# Patient Record
Sex: Male | Born: 1935
Health system: Southern US, Community
[De-identification: ages and names within clinical notes are randomized; demographics above are authoritative.]

## PROBLEM LIST (undated history)

## (undated) DIAGNOSIS — Z8719 Personal history of other diseases of the digestive system: Secondary | ICD-10-CM

## (undated) DIAGNOSIS — I2510 Atherosclerotic heart disease of native coronary artery without angina pectoris: Secondary | ICD-10-CM

## (undated) DIAGNOSIS — K219 Gastro-esophageal reflux disease without esophagitis: Secondary | ICD-10-CM

## (undated) DIAGNOSIS — N200 Calculus of kidney: Secondary | ICD-10-CM

## (undated) DIAGNOSIS — I639 Cerebral infarction, unspecified: Secondary | ICD-10-CM

## (undated) DIAGNOSIS — I25810 Atherosclerosis of coronary artery bypass graft(s) without angina pectoris: Secondary | ICD-10-CM

## (undated) DIAGNOSIS — Z951 Presence of aortocoronary bypass graft: Secondary | ICD-10-CM

## (undated) DIAGNOSIS — E785 Hyperlipidemia, unspecified: Secondary | ICD-10-CM

## (undated) DIAGNOSIS — I951 Orthostatic hypotension: Secondary | ICD-10-CM

## (undated) DIAGNOSIS — Z86718 Personal history of other venous thrombosis and embolism: Secondary | ICD-10-CM

## (undated) DIAGNOSIS — I10 Essential (primary) hypertension: Secondary | ICD-10-CM

## (undated) DIAGNOSIS — I491 Atrial premature depolarization: Secondary | ICD-10-CM

## (undated) DIAGNOSIS — M1990 Unspecified osteoarthritis, unspecified site: Secondary | ICD-10-CM

## (undated) DIAGNOSIS — Z9861 Coronary angioplasty status: Principal | ICD-10-CM

## (undated) DIAGNOSIS — I4891 Unspecified atrial fibrillation: Secondary | ICD-10-CM

## (undated) DIAGNOSIS — R2681 Unsteadiness on feet: Secondary | ICD-10-CM

## (undated) DIAGNOSIS — Z8679 Personal history of other diseases of the circulatory system: Secondary | ICD-10-CM

## (undated) DIAGNOSIS — Z8673 Personal history of transient ischemic attack (TIA), and cerebral infarction without residual deficits: Secondary | ICD-10-CM

## (undated) DIAGNOSIS — I6523 Occlusion and stenosis of bilateral carotid arteries: Secondary | ICD-10-CM

## (undated) DIAGNOSIS — I5042 Chronic combined systolic (congestive) and diastolic (congestive) heart failure: Secondary | ICD-10-CM

## (undated) HISTORY — DX: Hyperlipidemia, unspecified: E78.5

## (undated) HISTORY — DX: Coronary angioplasty status: Z98.61

## (undated) HISTORY — DX: Atherosclerosis of coronary artery bypass graft(s) without angina pectoris: I25.810

## (undated) HISTORY — DX: Personal history of other diseases of the digestive system: Z87.19

## (undated) HISTORY — DX: Personal history of other diseases of the circulatory system: Z86.79

## (undated) HISTORY — DX: Presence of aortocoronary bypass graft: Z95.1

## (undated) HISTORY — DX: Atrial premature depolarization: I49.1

## (undated) HISTORY — PX: COLON SURGERY: SHX602

## (undated) HISTORY — DX: Essential (primary) hypertension: I10

## (undated) HISTORY — DX: Personal history of transient ischemic attack (TIA), and cerebral infarction without residual deficits: Z86.73

## (undated) HISTORY — DX: Atherosclerotic heart disease of native coronary artery without angina pectoris: I25.10

## (undated) HISTORY — DX: Calculus of kidney: N20.0

## (undated) HISTORY — DX: Personal history of other venous thrombosis and embolism: Z86.718

## (undated) HISTORY — DX: Unsteadiness on feet: R26.81

## (undated) HISTORY — DX: Occlusion and stenosis of bilateral carotid arteries: I65.23

## (undated) HISTORY — DX: Gastro-esophageal reflux disease without esophagitis: K21.9

## (undated) HISTORY — PX: CARDIAC SURGERY: SHX584

## (undated) HISTORY — DX: Chronic combined systolic (congestive) and diastolic (congestive) heart failure: I50.42

## (undated) HISTORY — DX: Unspecified atrial fibrillation: I48.91

## (undated) HISTORY — PX: STENT PLACEMENT VASCULAR (ARMC HX): HXRAD1737

## (undated) HISTORY — PX: APPENDECTOMY: SHX54

## (undated) HISTORY — DX: Orthostatic hypotension: I95.1

## (undated) HISTORY — PX: TONSILLECTOMY: SUR1361

---

## 1998-02-17 ENCOUNTER — Ambulatory Visit (HOSPITAL_COMMUNITY): Admission: RE | Admit: 1998-02-17 | Discharge: 1998-02-17 | Payer: Self-pay | Admitting: Endocrinology

## 1998-06-15 ENCOUNTER — Ambulatory Visit (HOSPITAL_COMMUNITY): Admission: RE | Admit: 1998-06-15 | Discharge: 1998-06-15 | Payer: Self-pay | Admitting: *Deleted

## 1999-06-17 ENCOUNTER — Encounter: Admission: RE | Admit: 1999-06-17 | Discharge: 1999-06-17 | Payer: Self-pay | Admitting: Internal Medicine

## 1999-06-17 ENCOUNTER — Encounter: Payer: Self-pay | Admitting: Internal Medicine

## 1999-06-24 ENCOUNTER — Inpatient Hospital Stay (HOSPITAL_COMMUNITY): Admission: RE | Admit: 1999-06-24 | Discharge: 1999-06-27 | Payer: Self-pay | Admitting: Urology

## 1999-06-24 ENCOUNTER — Encounter (INDEPENDENT_AMBULATORY_CARE_PROVIDER_SITE_OTHER): Payer: Self-pay | Admitting: Specialist

## 2001-06-18 ENCOUNTER — Encounter: Admission: RE | Admit: 2001-06-18 | Discharge: 2001-06-18 | Payer: Self-pay | Admitting: Internal Medicine

## 2001-06-18 ENCOUNTER — Encounter: Payer: Self-pay | Admitting: Internal Medicine

## 2002-02-09 ENCOUNTER — Inpatient Hospital Stay (HOSPITAL_COMMUNITY): Admission: EM | Admit: 2002-02-09 | Discharge: 2002-02-11 | Payer: Self-pay | Admitting: Emergency Medicine

## 2002-02-09 ENCOUNTER — Encounter: Payer: Self-pay | Admitting: Internal Medicine

## 2002-03-07 ENCOUNTER — Encounter: Payer: Self-pay | Admitting: Internal Medicine

## 2002-03-07 ENCOUNTER — Encounter: Admission: RE | Admit: 2002-03-07 | Discharge: 2002-03-07 | Payer: Self-pay | Admitting: Internal Medicine

## 2003-10-24 ENCOUNTER — Encounter: Admission: RE | Admit: 2003-10-24 | Discharge: 2003-10-24 | Payer: Self-pay | Admitting: Internal Medicine

## 2004-02-07 ENCOUNTER — Emergency Department (HOSPITAL_COMMUNITY): Admission: EM | Admit: 2004-02-07 | Discharge: 2004-02-07 | Payer: Self-pay | Admitting: Emergency Medicine

## 2004-03-10 ENCOUNTER — Encounter: Admission: RE | Admit: 2004-03-10 | Discharge: 2004-03-10 | Payer: Self-pay | Admitting: Internal Medicine

## 2004-03-15 ENCOUNTER — Ambulatory Visit (HOSPITAL_COMMUNITY): Admission: RE | Admit: 2004-03-15 | Discharge: 2004-03-15 | Payer: Self-pay | Admitting: Cardiology

## 2004-11-30 ENCOUNTER — Encounter: Admission: RE | Admit: 2004-11-30 | Discharge: 2004-11-30 | Payer: Self-pay | Admitting: Internal Medicine

## 2004-12-09 ENCOUNTER — Inpatient Hospital Stay (HOSPITAL_COMMUNITY): Admission: EM | Admit: 2004-12-09 | Discharge: 2004-12-11 | Payer: Self-pay | Admitting: Emergency Medicine

## 2005-07-18 DIAGNOSIS — I2510 Atherosclerotic heart disease of native coronary artery without angina pectoris: Secondary | ICD-10-CM

## 2005-07-18 DIAGNOSIS — Z951 Presence of aortocoronary bypass graft: Secondary | ICD-10-CM

## 2005-07-18 HISTORY — PX: CORONARY ARTERY BYPASS GRAFT: SHX141

## 2005-07-18 HISTORY — DX: Atherosclerotic heart disease of native coronary artery without angina pectoris: I25.10

## 2005-07-18 HISTORY — DX: Presence of aortocoronary bypass graft: Z95.1

## 2006-03-18 DIAGNOSIS — Z951 Presence of aortocoronary bypass graft: Secondary | ICD-10-CM

## 2006-03-18 HISTORY — DX: Presence of aortocoronary bypass graft: Z95.1

## 2006-04-04 ENCOUNTER — Encounter: Admission: RE | Admit: 2006-04-04 | Discharge: 2006-04-04 | Payer: Self-pay | Admitting: Cardiology

## 2006-04-07 ENCOUNTER — Encounter: Payer: Self-pay | Admitting: Vascular Surgery

## 2006-04-07 ENCOUNTER — Inpatient Hospital Stay (HOSPITAL_COMMUNITY): Admission: RE | Admit: 2006-04-07 | Discharge: 2006-04-19 | Payer: Self-pay | Admitting: Cardiology

## 2006-04-08 ENCOUNTER — Encounter: Payer: Self-pay | Admitting: Vascular Surgery

## 2006-04-08 ENCOUNTER — Encounter (INDEPENDENT_AMBULATORY_CARE_PROVIDER_SITE_OTHER): Payer: Self-pay | Admitting: Cardiology

## 2006-04-12 ENCOUNTER — Encounter (INDEPENDENT_AMBULATORY_CARE_PROVIDER_SITE_OTHER): Payer: Self-pay | Admitting: Cardiology

## 2006-04-18 ENCOUNTER — Encounter: Payer: Self-pay | Admitting: Vascular Surgery

## 2006-05-15 ENCOUNTER — Encounter: Admission: RE | Admit: 2006-05-15 | Discharge: 2006-05-15 | Payer: Self-pay | Admitting: Cardiology

## 2006-05-18 ENCOUNTER — Encounter (HOSPITAL_COMMUNITY): Admission: RE | Admit: 2006-05-18 | Discharge: 2006-08-16 | Payer: Self-pay | Admitting: Cardiology

## 2006-08-18 ENCOUNTER — Encounter (HOSPITAL_COMMUNITY): Admission: RE | Admit: 2006-08-18 | Discharge: 2006-11-01 | Payer: Self-pay | Admitting: Cardiology

## 2006-12-12 ENCOUNTER — Inpatient Hospital Stay (HOSPITAL_COMMUNITY): Admission: EM | Admit: 2006-12-12 | Discharge: 2006-12-25 | Payer: Self-pay | Admitting: Emergency Medicine

## 2006-12-17 ENCOUNTER — Encounter (INDEPENDENT_AMBULATORY_CARE_PROVIDER_SITE_OTHER): Payer: Self-pay | Admitting: General Surgery

## 2007-06-01 ENCOUNTER — Emergency Department (HOSPITAL_COMMUNITY): Admission: EM | Admit: 2007-06-01 | Discharge: 2007-06-01 | Payer: Self-pay | Admitting: Emergency Medicine

## 2007-07-19 DIAGNOSIS — Z8679 Personal history of other diseases of the circulatory system: Secondary | ICD-10-CM

## 2007-07-19 HISTORY — DX: Personal history of other diseases of the circulatory system: Z86.79

## 2007-09-29 ENCOUNTER — Encounter: Admission: RE | Admit: 2007-09-29 | Discharge: 2007-09-29 | Payer: Self-pay | Admitting: Otolaryngology

## 2007-10-15 ENCOUNTER — Encounter: Admission: RE | Admit: 2007-10-15 | Discharge: 2007-10-15 | Payer: Self-pay | Admitting: Cardiology

## 2007-10-18 ENCOUNTER — Ambulatory Visit (HOSPITAL_COMMUNITY): Admission: RE | Admit: 2007-10-18 | Discharge: 2007-10-18 | Payer: Self-pay | Admitting: Cardiology

## 2007-11-14 ENCOUNTER — Emergency Department (HOSPITAL_COMMUNITY): Admission: EM | Admit: 2007-11-14 | Discharge: 2007-11-15 | Payer: Self-pay | Admitting: Emergency Medicine

## 2007-11-14 ENCOUNTER — Emergency Department (HOSPITAL_COMMUNITY): Admission: EM | Admit: 2007-11-14 | Discharge: 2007-11-14 | Payer: Self-pay | Admitting: Emergency Medicine

## 2010-02-15 DIAGNOSIS — Z8673 Personal history of transient ischemic attack (TIA), and cerebral infarction without residual deficits: Secondary | ICD-10-CM

## 2010-02-15 HISTORY — DX: Personal history of transient ischemic attack (TIA), and cerebral infarction without residual deficits: Z86.73

## 2010-02-24 ENCOUNTER — Inpatient Hospital Stay (HOSPITAL_COMMUNITY): Admission: EM | Admit: 2010-02-24 | Discharge: 2010-02-26 | Payer: Self-pay | Admitting: Emergency Medicine

## 2010-02-25 ENCOUNTER — Encounter (INDEPENDENT_AMBULATORY_CARE_PROVIDER_SITE_OTHER): Payer: Self-pay | Admitting: Internal Medicine

## 2010-03-31 ENCOUNTER — Emergency Department (HOSPITAL_COMMUNITY)
Admission: EM | Admit: 2010-03-31 | Discharge: 2010-03-31 | Payer: Self-pay | Source: Home / Self Care | Admitting: Emergency Medicine

## 2010-09-30 LAB — DIFFERENTIAL
Basophils Relative: 0 % (ref 0–1)
Lymphs Abs: 0.7 10*3/uL (ref 0.7–4.0)
Monocytes Relative: 6 % (ref 3–12)
Neutro Abs: 6.4 10*3/uL (ref 1.7–7.7)
Neutrophils Relative %: 84 % — ABNORMAL HIGH (ref 43–77)

## 2010-09-30 LAB — BASIC METABOLIC PANEL
GFR calc non Af Amer: >60 mL/min (ref >60)
Potassium: 4.2 mEq/L (ref 3.5–5.1)
Sodium: 140 mEq/L (ref 135–145)

## 2010-09-30 LAB — CBC
Hemoglobin: 13.1 g/dL (ref 13.0–17.0)
MCHC: 34.4 g/dL (ref 30.0–36.0)
Platelets: 255 10*3/uL (ref 150–400)
RBC: 4.06 MIL/uL — ABNORMAL LOW (ref 4.22–5.81)

## 2010-09-30 LAB — URINE CULTURE
Colony Count: NO GROWTH
Culture: NO GROWTH

## 2010-09-30 LAB — URINE MICROSCOPIC-ADD ON

## 2010-09-30 LAB — URINALYSIS, ROUTINE W REFLEX MICROSCOPIC
Nitrite: NEGATIVE
Specific Gravity, Urine: 1.028 (ref 1.005–1.030)
Urobilinogen, UA: 0.2 mg/dL (ref 0.0–1.0)

## 2010-10-01 LAB — GLUCOSE, CAPILLARY
Glucose-Capillary: 100 mg/dL — ABNORMAL HIGH (ref 70–99)
Glucose-Capillary: 100 mg/dL — ABNORMAL HIGH (ref 70–99)
Glucose-Capillary: 120 mg/dL — ABNORMAL HIGH (ref 70–99)
Glucose-Capillary: 90 mg/dL (ref 70–99)

## 2010-10-01 LAB — DIFFERENTIAL
Eosinophils Absolute: 0.1 10*3/uL (ref 0.0–0.7)
Eosinophils Relative: 2 % (ref 0–5)
Lymphocytes Relative: 24 % (ref 12–46)
Lymphs Abs: 1.5 10*3/uL (ref 0.7–4.0)
Monocytes Absolute: 0.5 10*3/uL (ref 0.1–1.0)
Monocytes Relative: 8 % (ref 3–12)

## 2010-10-01 LAB — URINALYSIS, ROUTINE W REFLEX MICROSCOPIC
Glucose, UA: NEGATIVE mg/dL
Hgb urine dipstick: NEGATIVE
Protein, ur: NEGATIVE mg/dL
Specific Gravity, Urine: 1.005 (ref 1.005–1.030)
Specific Gravity, Urine: 1.012 (ref 1.005–1.030)
Urobilinogen, UA: 1 mg/dL (ref 0.0–1.0)
pH: 6 (ref 5.0–8.0)
pH: 7.5 (ref 5.0–8.0)

## 2010-10-01 LAB — LIPID PANEL
LDL Cholesterol: 81 mg/dL (ref 0–99)
Triglycerides: 103 mg/dL (ref <150)

## 2010-10-01 LAB — COMPREHENSIVE METABOLIC PANEL
ALT: 14 U/L (ref 0–53)
ALT: 15 U/L (ref 0–53)
AST: 19 U/L (ref 0–37)
Albumin: 4.2 g/dL (ref 3.5–5.2)
Alkaline Phosphatase: 64 U/L (ref 39–117)
BUN: 16 mg/dL (ref 6–23)
Calcium: 8.9 mg/dL (ref 8.4–10.5)
Calcium: 9 mg/dL (ref 8.4–10.5)
Glucose, Bld: 100 mg/dL — ABNORMAL HIGH (ref 70–99)
Glucose, Bld: 97 mg/dL (ref 70–99)
Potassium: 3.9 mEq/L (ref 3.5–5.1)
Sodium: 138 mEq/L (ref 135–145)
Sodium: 139 mEq/L (ref 135–145)
Total Protein: 7 g/dL (ref 6.0–8.3)
Total Protein: 7 g/dL (ref 6.0–8.3)

## 2010-10-01 LAB — CK TOTAL AND CKMB (NOT AT ARMC)
CK, MB: 2.2 ng/mL (ref 0.3–4.0)
Relative Index: INVALID (ref 0.0–2.5)
Total CK: 95 U/L (ref 7–232)
Total CK: 98 U/L (ref 7–232)

## 2010-10-01 LAB — POCT I-STAT, CHEM 8
BUN: 19 mg/dL (ref 6–23)
Chloride: 108 mEq/L (ref 96–112)
Creatinine, Ser: 1.1 mg/dL (ref 0.4–1.5)
Potassium: 3.8 mEq/L (ref 3.5–5.1)
Sodium: 142 mEq/L (ref 135–145)

## 2010-10-01 LAB — HEMOGLOBIN A1C
Hgb A1c MFr Bld: 5.5 % (ref <5.7)
Hgb A1c MFr Bld: 6 % — ABNORMAL HIGH (ref <5.7)
Mean Plasma Glucose: 126 mg/dL — ABNORMAL HIGH (ref <117)

## 2010-10-01 LAB — CBC
HCT: 38 % — ABNORMAL LOW (ref 39.0–52.0)
Hemoglobin: 13 g/dL (ref 13.0–17.0)
Hemoglobin: 13.2 g/dL (ref 13.0–17.0)
MCH: 30.8 pg (ref 26.0–34.0)
MCH: 31.6 pg (ref 26.0–34.0)
MCV: 92.2 fL (ref 78.0–100.0)
RBC: 4.12 MIL/uL — ABNORMAL LOW (ref 4.22–5.81)
RBC: 4.29 MIL/uL (ref 4.22–5.81)
WBC: 6.1 10*3/uL (ref 4.0–10.5)

## 2010-10-01 LAB — TROPONIN I

## 2010-11-30 NOTE — Consult Note (Signed)
NAME:  Joshua Chambers, Joshua Chambers                 ACCOUNT NO.:  [id number]   MEDICAL RECORD NO.:  [id number]          PATIENT TYPE:  INP   LOCATION:  1441                         FACILITY:  [facility]   PHYSICIAN:  Angelia Mould. Derrell Lolling, M.D.DATE OF BIRTH:  01-02-1923   DATE OF CONSULTATION:  12/17/2006  DATE OF DISCHARGE:                                 CONSULTATION   REASON FOR CONSULTATION:  Evaluate small-bowel obstruction.   HISTORY OF PRESENT ILLNESS:  This is a very pleasant 77 year old white  man who has a history of coronary artery bypass grafting performed by  Dr. Kathlee Nations Trigt 6 months ago.  He did well with that.  He does not  really have any history of GI problems.  Five or six days ago, on Dec 12, 2006, he developed the abrupt onset of periumbilical pain and  shortly followed with nausea and vomiting.  This was very unusual and  moderately severe, and so he came to the emergency room and was admitted  by Dr. Ellie Lunch.   His initial evaluation included a CT scan which showed a tiny amount of  ascites around the liver, some atheromatous changes of the aorta,  possibly a small calcified focal dissection, not acute, and some  abnormal small-bowel loops with stranding and edema of the mesentery of  the left abdomen, nonspecific finding.  Ischemia and enteritis were  suggested.  Diverticulosis was noted.   Following this, the patient was admitted, placed on IV hydration and  bowel rest.  He continued to have nausea.  Because of continued nausea,  a nasogastric tube was placed on May 30.  That has drained mild to  moderate amounts.   He has not had a bowel movement in 5-6 days now and has not been passing  flatus.  He continues to have a little bit of pain but nothing  excessive.   Because of the concern for possible ischemia, he had a CT angiogram of  the abdomen which showed no evidence for vascular occlusion.  Specifically, there was a little bit of stenosis of the celiac artery,  but the superior mesenteric artery and IMA were completely patent.  What  was more notable was progressive small bowel distension with mild wall  thickening and focal transition between the proximal and mid ileum.   Abdominal x-rays were obtained today.  They show progressive high-grade  small-bowel obstruction.  At that point, the patient was evaluated by  Dr. Herbert Moors.  Dr. Evette Cristal called me and said that he was concerned  about progressive small-bowel obstruction, and I came to see the  patient.   The patient states that he is feeling reasonably well but still has a  little bit of discomfort in his abdomen.  He is not hungry.  He is not  passing any flatus.  He has never had pain like this before.   PAST MEDICAL HISTORY:  1. History of coronary artery bypass grafting 6 months ago by Dr.      Kathlee Nations Trigt.  Dr. Armanda Magic is his cardiologist.  2. Hyperlipidemia.  3.  History of a low blood pressure.  4. Status post appendectomy, remote  5. History of left kidney stone that required removal several years      ago.   HOME MEDICATIONS:  1. Crestor 10 mg daily.  2. Aspirin 81 mg daily.  3. Zetia 10 mg daily.   DRUG ALLERGIES:  None known.   SOCIAL HISTORY:  The patient is currently widowed. His wife passed away  in 18-Jun-2023. His son is here with him.  He is very active and exercises  five times a week.  Denies history of smoking, drinking or drug abuse.   FAMILY HISTORY:  Noncontributory.   REVIEW OF SYSTEMS:  Some postural dizziness and vertigo.  He is being  worked up as an outpatient   PHYSICAL EXAMINATION:  GENERAL:  A pleasant older gentleman who is alert  and cooperative and pleasant and in minimal distress.  VITAL SIGNS:  Temperature 98.8, blood pressure 145/91, pulse 71,  respiratory rate 18, oxygen saturation 97%.  EYES:  Sclerae clear.  Extraocular movements intact.  EARS, NOSE, MOUTH, THROAT:  Nose, lips, tongue and oropharynx are  without gross lesions.   NECK:  Supple and nontender.  No mass.  No jugular distension.  LUNGS:  Clear to auscultation.  No chest wall tenderness.  Well-healed  sternotomy scar.  HEART:  Regular rate and rhythm.  No murmur.  Radial and femoral pulses  are palpable.  ABDOMEN:  Soft, somewhat distended, few bowel sounds, no mass, no  hernia, well-healed right lower quadrant scar.  Mild to diffuse  tenderness somewhat more on the left lower than anywhere else.  EXTREMITIES:  Moves all four extremities well without pain or deformity.  NEUROLOGIC:  No gross motor or sensory deficits.   LABORATORY DATA:  Reveals a hemoglobin 13.1, white blood cell count of  4100.  Basic metabolic panel is normal with exception of a glucose of  119.  Lipase 34.  Amylase 59.   EKG has been ordered and is pending.   ASSESSMENT:  High-grade small-bowel obstruction.  Most likely cause  would be adhesions.  Less likely would be internal hernia or neoplasia.  Because of the progression of this process over 5 days, I think that he  is at risk for ischemia and perforation and have advised operative  intervention today.  1. Coronary artery disease status post coronary bypass grafting,      stable.  2. Hyperlipidemia.  3. Status post appendectomy.  4. Status post left kidney stones.   PLAN:  1. Will we will proceed with exploratory laparotomy for relief a small-      bowel obstruction today.  2. Have discussed the indications and details of surgery with the      patient and his son.  Risks and complications have been outlined,      including but not limited to bleeding, infection. Differential      diagnoses discussed.  Other complications including injury to      adjacent organs such as the large intestine, ureter, bladder or      major vascular      structures with major obstructive surgery.  Cardiac, pulmonary and      thromboembolic problems.  He seems to understand these issues well.     At this time all his questions are  answered.  He is in full      agreement with this plan.      Angelia Mould. Derrell Lolling, M.D.  Electronically Signed  HMI/MEDQ  D:  12/17/2006  T:  12/17/2006  Job:  [id number]   cc:   Candyce Churn, M.D.  Fax: [phone]   Graylin Shiver, M.D.  Fax: [phone]

## 2010-11-30 NOTE — Consult Note (Signed)
NAME:  BRINSON, TOZZI NO.:  [id number]   MEDICAL RECORD NO.:  [id number]          PATIENT TYPE:  EMS   LOCATION:  ED                           FACILITY:  [facility]   PHYSICIAN:  Lindaann Slough, M.D.  DATE OF BIRTH:  1922/08/16   DATE OF CONSULTATION:  11/15/2007  DATE OF DISCHARGE:  11/15/2007                                 CONSULTATION   REASON FOR CONSULTATION:  Gross hematuria and difficulty urinating.   Mr. Demore is an 77 year old year male who had seen Dr. Etta Grandchild in the  past.  He last saw him in 2007.  He has been having gross hematuria for  about a week.  He saw Dr. Kevan Ny, who treated him with Cipro; however, he  continued to have gross hematuria and it got worse yesterday.  He went  to see Dr. Kevan Ny, who then referred him for further evaluation.  Mr.  Batson was seen in the emergency room and had a #18 Foley catheter  inserted in the bladder.  He was sent home.  However, he continued to  have gross hematuria and he started leaking around the Foley catheter.  He returned to the emergency room and a #24 Foley catheter was  reinserted in the bladder, and he was started on continuous irrigation.  However, he continued to leak around the Foley catheter.  I then was  asked to see him again.  I then inserted a #24 Simplastic catheter in  the bladder; however, I did not get a good return from the catheter and,  therefore, I removed it.  I passed a flexible cystoscope in the bladder  and I passed the Glidewire through the cystoscope into the bladder and  then inserted a #22-French Councill tip catheter over the Glidewire into  the bladder.  I irrigated several small blood clots out of the bladder  and the catheter started to drain well and the return was slightly  bloody.  I then told the patient that he could go home and we will  schedule him for a CT scan as an outpatient.  He will probably need  cystoscopy.  However, I have told him if he starts having problems  with  the catheter to call the office.   PAST MEDICAL HISTORY:  Positive for coronary artery disease and  hypercholesterolemia.   PAST SURGICAL HISTORY:  He had coronary artery bypass surgery on  April 10, 2006, exploratory laparotomy and TURP x2, the last time in  2000, and both those procedures were done in [place].   FAMILY HISTORY:  Both his parents are deceased.  His father and mother  died at age 75 of unknown causes to him.  He had two brothers and one  sister, all deceased.   SOCIAL HISTORY:  He is a widower.  His wife passed away last year after  54 years of marriage and he has one son.  He does not smoke and drinks  occasionally.   MEDICATIONS:  Crestor, aspirin, and Cipro.   ALLERGIES:  NO KNOWN DRUG ALLERGIES.   REVIEW OF SYSTEMS:  As per  HPI, otherwise everything else is negative.   PHYSICAL EXAMINATION:  GENERAL:  This is a well-developed 77 year old  male who is complaining of suprapubic discomfort.  VITAL SIGNS:  Blood  pressure is 121/76, pulse 87, respirations 18, temperature 98.7.  HEENT:  His head is normal.  He has pink conjunctivae.  Ears, nose and  throat are within normal limits.  NECK:  Supple.  No cervical  adenopathy.  No thyromegaly.  CHEST:  Symmetrical.  LUNGS:  Fully expanded and clear to percussion and auscultation.  HEART:  Regular rhythm.  ABDOMEN:  Soft.  Tender in the suprapubic area.  He has a well-healed  midline scar.  Liver, spleen and kidneys are not palpable.  Bowel sounds  are normal  GENITALIA:  Penis is uncircumcised.  Meatus is normal.  Scrotum is  normal.  Both testicles are normal.  Cords and epididymis are within  normal limits.  RECTAL:  Sphincter tone is normal.  Prostate is enlarged, 50 gm without  any nodules and seminal vesicles are not palpable.   IMPRESSION:  1. Urinary retention.  2. Gross hematuria.   PLAN:  Leave Foley catheter indwelling.  Continue Cipro.  CT scan of the  kidneys.  Return to the office  after CT scan.  The patient will probably  need cystoscopy for further evaluation of gross hematuria.      Lindaann Slough, M.D.  Electronically Signed     MN/MEDQ  D:  11/15/2007  T:  11/15/2007  Job:  [id number]   cc:   Candyce Churn, M.D.  Fax: [phone]

## 2010-11-30 NOTE — Discharge Summary (Signed)
NAME:  Joshua Chambers, Joshua Chambers NO.:  [id number]   MEDICAL RECORD NO.:  [id number]          PATIENT TYPE:  INP   LOCATION:  1339                         FACILITY:  [facility]   PHYSICIAN:  Candyce Churn, M.D.DATE OF BIRTH:  1922-10-06   DATE OF ADMISSION:  12/12/2006  DATE OF DISCHARGE:                               DISCHARGE SUMMARY   DISCHARGE DIAGNOSES:  1. Small-bowel obstruction secondary to strangulated loop of small-      bowel secondary to adhesion - treated surgically with resolution.  2. Ischemic bowel secondary to #1 - resolved.  3. History of coronary artery bypass grafting 6 months ago - follow up      with Dr. Armanda Magic.  4. History of dyslipidemia.  5. History of hypotension.  6. Status post appendectomy.  7. History of left renal calculus requiring extraction in the distant      past.   CURRENT MEDICATIONS:  1. Crestor 10 mg daily.  2. Aspirin 81 mg daily.  3. Zetia 10 mg daily, but will hold for 1 week and then resume.   CONSULTATIONS:  1. Dr. Charlott Rakes - Dec 14, 2006.  2. Dr. Claud Kelp - December 17, 2006.   PROCEDURE:  December 17, 2006, Dr. Claud Kelp.  Preoperative diagnosis:  Small-bowel obstruction.  Postoperative diagnosis:  Small-bowel  obstruction secondary to adhesions with closed loop obstruction with  ischemic stricture.  Exploratory laparotomy was performed with lysis of  adhesions and segmental small bowel resection.   DISCHARGE DIET:  Bland diet, to progress to low-fat/regular.   HOSPITAL COURSE:  [name] is a very pleasant 77 year old male  with history of coronary artery disease, vertigo, recent issues with  mild hypotension, and hyperlipidemia.  Developed on Dec 12, 2006, abrupt  onset of periumbilical pain followed by nausea and vomiting.  Presented  to the [facility] emergency room and CT scan revealed a small amount of  perihepatic ascites and some abnormal small-bowel loops with stranding  and edema of  the mesentery of the left abdomen - nonspecific.  Ischemic  enteritis was suggested.  The patient was put on IV fluids and kept  n.p.o. and ultimately required further followup with a CT angiogram of  the abdomen, working up possible ischemic colitis but there was no  evidence of any vascular occlusion.   There was a little bit of stenosis of the celiac artery but the SMA and  IMA were completely patent.   Unfortunately, he continued to develop a progressive small-bowel  distension with small-bowel wall thickening and there was a focal  transition point between the proximal and mid ileum.   Because of inability to improve symptomatically, he was taken to surgery  on December 17, 2006, with the above findings.   Postoperatively, he has done well and NG tube has been removed and he is  now ambulating and taken p.o. well.   He still feels somewhat weak but much improved and is ambulatory, and  will discharge home with home healthy nursing visits for 1 week.   LABORATORIES ON DISCHARGE:  CBC on  December 21, 2006, revealed a white count  5200 with a white count of 13,300 on admission.  Hemoglobin on discharge  12.4.  Platelet count on December 21, 2006, was 313,000.   Last electrolyte panel on December 22, 2006, revealed a sodium 136, potassium  3.8, chloride 104, bicarb 24, glucose 121, BUN 6, creatinine 0.8.   Liver functions from December 21, 2006, revealed a total protein 5.1, albumin  2.4, AST 15, ALT 17, alk phos 68, total bili 1.4.  BNP on December 22, 2006,  was 13.   The patient did have a urinalysis on Dec 12, 2006, revealing nitrite  positive, bilirubin moderate, hemoglobin large, leukocyte esterase  moderate, 3-6 white cells, rbc's too numerous to count, and many  bacteria.  A urine culture, however, from that day revealed no growth.   EKG from December 17, 2006, revealed normal sinus rhythm.  Normal intervals  and axis.  No ST-segment elevation or depression.  Nonacute EKG.   The patient will be  discharged home in the afternoon of December 25, 2006, to  resume the usual medications in 1 week.  Will follow him up in the  office in 1 week and he is to notify us if he develops any nausea,  vomiting, recurrent abdominal pain, fever or chills, or any other  unusual symptoms.      Candyce Churn, M.D.  Electronically Signed     RNG/MEDQ  D:  12/25/2006  T:  12/25/2006  Job:  [id number]   cc:   Armanda Magic, M.D.  Fax: [phone]   Kerin Perna, M.D.  [address]  [place]  [place] [address]   Angelia Mould. Derrell Lolling, M.D.  [address], [address]  [place]  [place] [address]   Shirley Friar, MD  Fax: [phone]

## 2010-11-30 NOTE — H&P (Signed)
NAME:  Joshua Chambers, Joshua Chambers                 ACCOUNT NO.:  [id number]   MEDICAL RECORD NO.:  [id number]          PATIENT TYPE:  OIB   LOCATION:  2899                         FACILITY:  [facility]   PHYSICIAN:  Ritta Slot, MD     DATE OF BIRTH:  09-Jan-1923   DATE OF ADMISSION:  10/18/2007  DATE OF DISCHARGE:                              HISTORY & PHYSICAL   TILT TABLET STUDY   INDICATIONS:  [name] is an 77 year old Caucasian male who has a past  medical history of four-vessel coronary artery bypass grafting September  2007, dyslipidemia, small-bowel obstruction secondary to adhesions  treated surgically with resolution who now comes in with a year history  of dizziness.  In the office, he was found to have postural hypotension  with a blood pressure of 124/72 supine dropping to 84/66 on standing  with a heart rate staying stable at 74 beats per minute.  He is  physically active and does cycle and swim and gets occasionally dizzy  when he does these procedures.  It was unsure whether or not that he had  along with his postural hypotension whether he had significant  cardioinhibitory syncope requiring the insertion of a pacemaker.  He was  therefore brought for tilt table study to evaluate the need for  pacemaker insertion.  The patient was brought to the 2nd floor of the  [facility] procedure and after informed consent, the patient was brought  to 2nd floor of the [facility] to the EP lab.  He was  maintained in the supine position for 5 minutes.  At baseline, his blood  pressure was 169/103 with a heart rate of 65.  After 5 minutes of  supine, he was tilted to 70 degrees.  His blood pressure trended  downwards over the next 45 minutes reaching a trough of a blood pressure  66/43 with a peak heart rate of 113 beats per minute, but there were no  significant bradycardias nor any tachyarrythmias.  He maintained sinus  rhythm throughout.  He was mildly symptomatic with lightheadedness  and  vision changes and blurry vision and fatigue.  However, on returning to  the supine position at the end of the study, his blood pressure came  back up to 130/79 with a heart rate being 116 beats per minute in sinus  rhythm.  His symptoms resolved.   In essence, this is a strongly positive study with vasodepressor syncope  but negative for any cardioinhibitory syncope.   PLAN:  The patient will need to be treated with thigh-high TED  stockings, liberalization of his salt intake and in addition he may well  need Florinef or Midodrine as an outpatient.  I will set him up with  stockings and salt liberalization to begin with.  I will see him in the  clinic in followup.  At that time should he need Florinef, we will  commence him on that.  Many thanks.      Ritta Slot, MD  Electronically Signed     HS/MEDQ  D:  10/18/2007  T:  10/18/2007  Job:  [id number] 

## 2010-11-30 NOTE — Consult Note (Signed)
NAME:  Joshua Chambers, Joshua Chambers                 ACCOUNT NO.:  [id number]   MEDICAL RECORD NO.:  [id number]          PATIENT TYPE:  INP   LOCATION:  1441                         [facility]   PHYSICIAN:  Shirley Friar, MDDATE OF BIRTH:  08/29/1922   DATE OF CONSULTATION:  DATE OF DISCHARGE:                                 CONSULTATION   REQUESTING PHYSICIAN:  Dr. Earl Gala.   INDICATIONS:  Nausea, abdominal pain.   HISTORY OF PRESENT ILLNESS:  Joshua Chambers is an 77 year old white male who  is a patient of Dr. Johnella Moloney, who was admitted on Dec 12, 2006, due  to the acute onset of periumbilical abdominal pain, followed by repeated  dry heaving, nausea and vomiting.  He denied any problems eating prior  to admission and denies any problems with abdominal pain, heartburn,  dysphagia or melena, prior to admission.  Since presentation with this  abdominal pain, he denies any further bowel movements and denies any  bright red blood per rectum.  A CT scan was done on admission which  showed small bowel loops in his left abdomen, with edema and straining  of the mesentery, concerning for enteritis, although was reported to be  a nonspecific finding.  His white blood count on admission was 13.3.   PAST MEDICAL HISTORY:  Coronary artery disease, hyperlipidemia, status  post coronary artery bypass graft, hypertension, status post  appendectomy, history of kidney stones.   MEDICATIONS:  On admission:  Crestor, aspirin, Zetia.   ALLERGIES:  NO KNOWN DRUG ALLERGIES.   SOCIAL HISTORY:  Denies smoking, alcohol or drugs.   REVIEW OF SYSTEMS:  Negative except as stated above, negative from a GI  standpoint, except as stated above.   PHYSICAL EXAM:  VITAL SIGNS:  Temperature 98.0, pulse 71, blood pressure  124/78, O2 SATs 93% on room air.  GENERAL:  Alert, in no acute distress.  ABDOMEN:  Mild periumbilical tenderness with guarding, otherwise  nontender, no rebound, soft, nondistended, positive  bowel sounds.   LABS:  Hemoglobin 13.3, white blood count 9, platelet count 247, BUN 26,  creatinine 0.78, potassium 4.0.   IMPRESSION:  A 77 year old white male presenting with acute onset of  periumbilical abdominal pain, nausea and dry heaving who had a CAT scan  showing edema and stranding in the small bowel mesentery on the left  side of his abdomen.  He has acute onset of his symptoms  suggesting enteritis from ischemia versus infection.  Would do an upper  GI series with small-bowel follow-through  to further evaluate for active inflammation.  If his small bowel follow-  through is unremarkable, would slowly advance his diet.  If he does not  tolerate diet, then may need to reconsider CT angiogram.  Otherwise,  would continue supportive care.      Shirley Friar, MD  Electronically Signed     VCS/MEDQ  D:  12/14/2006  T:  12/14/2006  Job:  [id number]   cc:   Candyce Churn, M.D.  Fax: [phone]

## 2010-11-30 NOTE — Op Note (Signed)
NAME:  Joshua Chambers, Joshua Chambers                 ACCOUNT NO.:  [id number]   MEDICAL RECORD NO.:  [id number]          PATIENT TYPE:  INP   LOCATION:  1231                         FACILITY:  [facility]   PHYSICIAN:  Angelia Mould. Derrell Lolling, M.D.DATE OF BIRTH:  Dec 28, 1922   DATE OF PROCEDURE:  12/17/2006  DATE OF DISCHARGE:                               OPERATIVE REPORT   PREOPERATIVE DIAGNOSIS:  Small-bowel obstruction.   POSTOPERATIVE DIAGNOSIS:  Small-bowel obstruction secondary to  adhesions, closed loop obstruction with ischemic stricture.   OPERATION PERFORMED:  Exploratory laparotomy, lysis of adhesions,  segmental small-bowel resection.   SURGEON:  Dr. Claud Kelp.   FIRST ASSISTANT:  Dr. Marca Ancona.   OPERATIVE INDICATIONS:  This is an 77 year old white man who has had an  appendectomy many years ago.  He was hospitalized five days ago with  abdominal pain, nausea, and vomiting.  Radiographically he has developed  a progressive small-bowel obstruction over this period of time and has  not passed any flatus or stool.  I was asked to see him today, and it  was my impression that he had progressive obstruction despite adequate  medical care and that he needed to be operated upon promptly.  This was  discussed with the patient and his son, and they were in agreement.   OPERATIVE FINDINGS:  The patient had several hundred mL of bloody  ascites, but there was no odor and no signs of perforation.  He had a  closed loop obstruction of the proximal ileum involving about 18-24  inches of the small bowel.  The proximal and distal strictures were due  to the closed loop adhesion which was taken down, but even after 15 or  20 minutes, the structures themselves looked fairly ischemic.  The rest  of the small bowel looked good with good color and good pulsations in  the mesentery.  We finally decided to resect the segment of small bowel  involving both the proximal and distal strictures, due to concern  for  delayed perforation.  He had extensive diverticula of the left colon and  the sigmoid colon.  He had a fair amount of hard stool throughout the  colon.  There was no sign of any cancer or hernia.   OPERATIVE TECHNIQUE:  Following the induction of general endotracheal  anesthesia, the patient's abdomen was prepped and draped in a sterile  fashion.  Foley catheter was inserted prior to the prep.  The patient  was identified as correct patient and correct procedure.  Intravenous  antibiotics were given prior to the incision.  The midline laparotomy  was performed.  The abdomen was entered and explored with findings as  described above.  We evacuated the ascites.  We took down the adhesion  which was causing the closed loop obstruction to the retroperitoneum.  We ran the small bowel all three separate occasions all the way from the  ileocecal valve back up the ligament of Treitz.  We milked the small  bowel content retrograde back up into the stomach, where it was removed  through the nasogastric tube, and about  1000 mL or more was evacuated.  After spending about 50 or 20 minutes with warm saline irrigation of the  abdomen.  The proximal and distal strictured areas looked ischemic, and  we decided to resect them.  The small bowel was resected about 1 inch  proximal to the proximal stricture and about 1 inch distal to the distal  stricture using a GIA stapling device.  Small bowel mesentery was  divided using the LigaSure device and the specimen removed.  An  anastomosis was created between the loops of small bowel using a GIA  stapling device, and the defect in the bowel wall was closed with a TA-  60 stapling device.  The mesentery was closed with 3-0 silk figure-of-  eight sutures.  A few extra sutures of 3-0 silk were placed to reinforce  the staple line at critical points.  We irrigated out the abdomen one  more time.  We found no bleeding or abnormality.  Small bowel and  omentum  were returned to their anatomic positions.  The midline fascia  was closed with a running suture of #1 PDS, and the skin closed with  skin staples.  Clean bandages were placed, and the patient was taken to  the recovery room in stable condition.   ESTIMATED BLOOD LOSS:  About 100 mL.   COMPLICATIONS:  None.   SPONGE NEEDLE AND INSTRUMENT COUNTS:  Correct.      Angelia Mould. Derrell Lolling, M.D.  Electronically Signed     HMI/MEDQ  D:  12/17/2006  T:  12/17/2006  Job:  [id number]   cc:   Candyce Churn, M.D.  Fax: [phone]   Graylin Shiver, M.D.  Fax: [phone]

## 2010-11-30 NOTE — H&P (Signed)
NAME:  Joshua Chambers, Joshua Chambers                 ACCOUNT NO.:  [id number]   MEDICAL RECORD NO.:  [id number]          PATIENT TYPE:  INP   LOCATION:  1441                         FACILITY:  [facility]   PHYSICIAN:  Ellie Lunch, M.D.      DATE OF BIRTH:  08-27-1922   DATE OF ADMISSION:  12/12/2006  DATE OF DISCHARGE:                              HISTORY & PHYSICAL   PRIMARY CARE PHYSICIAN:  Molly Maduro A. Nicholos Johns, M.D.   REASON FOR ADMISSION:  Ischemic enteritis.   HISTORY OF PRESENT ILLNESS:  The patient is a very pleasant 77 year old  white male with a past medical history of CABG and dyslipidemia that  came to the emergency room because of abrupt onset of periumbilical 10  out of 10 colicky abdominal pain that started 6:00 a.m. this morning.  Shortly following that the patient had an episode of vomiting at 8  o'clock. Since then his pain has continuously gotten worse and thus he  decided to come to the emergency room. The patient was completely fine  up until yesterday.  He denies any fevers or chills.  He also denies any  diarrhea or constipation.  His last bowel movement was yesterday morning  and was normal and it was not dark. This is the first episode for the  patient.  The patient recently had bypass surgery performed done about 6  months ago. He also denies any recent travel or any sick contacts.   PAST MEDICAL HISTORY:  1. History of bypass surgery performed 6 months ago.  The patient's      cardiologist, Dr. Mayford Knife.  2. Dyslipidemia.  3. History of low blood pressure.  4. Appendectomy.  5. History of left kidney stone that required removal many years ago.   CURRENT MEDICATIONS:  1. Crestor 10 mg daily.  2. Aspirin 81 mg daily.  3. Zetia 10 mg daily.   ALLERGIES:  NO KNOWN DRUG ALLERGIES.   SOCIAL HISTORY:  The patient is currently widowed, his wife passed away  in 06/20/23.  He is very active and exercises five times a week.  He  denies any history of smoking, drinking or drug use.   REVIEW OF SYSTEMS:  The patient recently has been experiencing a lot of  postural dizziness associated with vertigo as well as progressive left  hearing loss and left-sided ear tinnitus. This is currently being worked  up as an outpatient.   PHYSICAL EXAMINATION:  VITAL SIGNS: Temperature 96.6, blood pressure  140/78, pulse 88, respirations 16, and O2 sat 98%.  GENERAL:  The patient is in no acute distress currently.  He was lying  on the left side.  HEENT: Pupils equal, round, react to light.  No clonus.  Oropharynx  clear.  Neck is supple.  No JVD.  CARDIOVASCULAR: Regular rate and rhythm.  No murmurs.  CHEST: Clear to auscultation bilaterally.  No rales or wheezing.  ABDOMEN:  Soft with tenderness to palpation in all quadrants except the  epigastrium. Very low bowel sounds.  It is not distended.  There is no  guarding or rigidity.  EXTREMITIES: No clubbing,  cyanosis or edema.   ADMISSION LABS:  That are positive.  White count of 13.3 with an ANC of  12.5, hemoglobin 14.4, platelets 264. Sodium of 142, potassium 3.9,  chloride 109, bicarb 25, glucose 148, BUN 19, creatinine 0.9. Normal  liver function tests.   CT of the abdomen performed with contrast showed abdominal small bowel  wall edema which is also mildly dilated with mesenteric edema as well  that is suggestive of ischemic enteritis. There was also small amount of  ascites present.   PLAN:  1. Ischemic enteritis.  I went over report of the CT scan with      radiologist.  It seems that the patient is having an episode of      ischemic enteritis rather than viral enteritis given mesenteric      findings on CT. Unfortunately it was incomplete contrast study and      thus all of the mesenteric vessels were not visualized. In either      case my plan is to manage him conservatively right now with n.p.o.,      IV hydration and symptomatic management with Dilaudid and Zofran.      If the patient does not get better in the next  24 hours the patient      will need a CT angio with contrast into the mesenteric vessels.      Given elevated white count we will also go ahead and get blood      cultures as well.      Ellie Lunch, M.D.  Electronically Signed     BP/MEDQ  D:  12/12/2006  T:  12/12/2006  Job:  [id number]   cc:   Molly Maduro A. Nicholos Johns, M.D.  Fax: [phone]

## 2010-12-03 NOTE — Discharge Summary (Signed)
NAME:  Joshua Chambers, Joshua Chambers                           ACCOUNT NO.:  [id number]   MEDICAL RECORD NO.:  [id number]                   PATIENT TYPE:  INP   LOCATION:  0465                                 FACILITY:  [facility]   PHYSICIAN:  Barbette Or, MD                 DATE OF BIRTH:  1923/02/05   DATE OF ADMISSION:  02/09/2002  DATE OF DISCHARGE:  02/11/2002                                 DISCHARGE SUMMARY   DISCHARGE DIAGNOSES:  1. Acute gastroenteritis with dehydration, resolved.  Possible food borne     illness.  2. History of benign positional vertigo.  3. Hyperlipidemia.  4. Insomnia.  5. History of renal calculi.   DISCHARGE MEDICATIONS:  1. Zocor 20 mg q.d.  2. Klonopin 0.5 mg at bedtime.  3. Niaspan extended release 1 g p.o. at bedtime.  4. Will resume aspirin therapy after follow up appointment in the office.   HOSPITAL COURSE:  The patient is a very pleasant 77 year old male who was  feeling quite well until 20 hours prior to admission without nausea,  vomiting, or diarrhea.  He has also complained of fevers and chills.  Had  copious diarrhea, but no blood in his stool.  Was not able to keep liquids  or solids down.  Was brought to the [facility] Emergency Room and  evaluated, and was found to have normal vital signs, but white count was  15,700.  He did develop a fever to 100 on the day of admission, but was  having fairly severe crampy abdominal pain.  He was started on intravenous  ciprofloxacin and IV fluids, and felt much better on the day following  admission.  He was observed for 24 hours and discharged on 02/11/02 in  improved condition.   LABORATORY DATA:  Laboratories on admission revealed a white count of  15,700, hemoglobin 15.5, platelet count 269,000, white count on 02/10/02 was  8600, hemoglobin 12.0, platelet count 249,000.  White count was felt to be  due to the stress of his illness.   Laboratories on admission revealed a BUN of 21 and creatinine of 1.0,  after  IV hydration revealed BUN was 15 and creatinine was 0.9 24 hours after  admission.   Liver function tests were normal during this admission.  Lipase was low at  19.  Amylase was normal at 68.   On the day of admission he had a x-ray of his abdomen and chest revealing no  acute abnormalities.   The patient was discharged home in improved condition.  This was felt to be  a probable viral gastroenteritis.   DIET:  He was to follow a bland diet for several days at home, and then  resume his normal diet.  Barbette Or, MD   RNG/MEDQ  D:  04/04/2002  T:  04/04/2002  Job:  [id number]

## 2010-12-03 NOTE — Op Note (Signed)
NAME:  Joshua Chambers, Joshua Chambers NO.:  [id number]   MEDICAL RECORD NO.:  [id number]          PATIENT TYPE:  INP   LOCATION:  2309                         FACILITY:  [facility]   PHYSICIAN:  Kerin Perna, M.D.  DATE OF BIRTH:  08-13-1922   DATE OF PROCEDURE:  DATE OF DISCHARGE:                                 OPERATIVE REPORT   OPERATION:  Coronary artery bypass grafting x4 (left internal mammary artery  to LAD, saphenous vein graft to diagonal, saphenous vein graft to obtuse  marginal, saphenous vein graft to right coronary artery).   PREOPERATIVE DIAGNOSIS:  Class IV unstable angina, with left main and three-  vessel coronary artery disease.   POSTOPERATIVE DIAGNOSIS:  Class IV unstable angina, with left main and three-  vessel coronary artery disease.   SURGEON:  Kerin Perna, M.D.   ASSISTANTS:  1. Sheliah Plane, M.D.  2. Danelle Earthly, PA-C.   ANESTHESIA:  General   INDICATIONS:  The patient is an 77 year old male who has had symptoms of  progressive weakness and exercise intolerance over the past several weeks.  Acts of daily living have resulted in extreme fatigue.  He underwent a  cardiac cath by Dr. Carolanne Grumbling which demonstrated left main and three-  vessel coronary artery disease, with EF of 50%.  He was felt to be a  candidate for surgical revascularization.  I saw the patient in his hospital  room following cardiac catheterization and reviewed results the cardiac  catheterization  with the patient and family.  I discussed the indications  and expected benefits of surgical coronary revascularization.  I reviewed  the alternatives to surgery as well.  I discussed with the patient and  family the major aspects of the planned procedure, including the choice of  grafts to include internal mammary artery and endoscopically harvested  saphenous vein, the use of general anesthesia and cardiopulmonary bypass,  and the expected postoperative hospital  recovery.  I reviewed with the  patient the risks to him of coronary artery bypass surgery, including the  risks of MI, CVA, stroke, bleeding, blood transfusion requirement,  infection, and death.  He understood that at his age she would be at an  increased risk of this operation; however, it would be the best therapy for  his severe coronary disease, with the chance for significant improvement in  survival and quality of life.  After reviewing these issues with the  patient, he demonstrated his understanding and agree to proceed with  operation under what I felt was an informed consent.   OPERATIVE FINDINGS:  The mammary artery was a good vessel, with excellent  flow.  The vein was harvested endoscopically from the right leg.  The  patient was given 1 unit of packed cells during the operation for a  hemoglobin of 7 g while on bypass.  The patient was given aprotinin protocol  to reduce bleeding risks, as his baseline ACT was high at 158 seconds.  His  preoperative creatinine was normal.   PROCEDURE:  The patient was brought to the operating room placed supine  on  the operating table.  General anesthesia was induced under invasive  hemodynamic monitoring.  The chest, abdomen, and legs were prepped with  Betadine and draped as a sterile field.  A sternal incision was made as the  saphenous vein was harvested endoscopically from the right leg.  The left  internal mammary artery was harvested as a pedicle graft from its origin at  the subclavian vessels.  Heparin was administered, and the ACT was  documented as being therapeutic.  The sternal retractor was placed, and the  pericardium was opened and suspended.  Pursestrings were placed in the  ascending aorta and right atrium, and the patient was cannulated and placed  on bypass.  The coronaries were identified for grafting.  The mammary artery  and vein grafts were prepared for the distal anastomoses.  Cardioplegia  catheters were placed  for both antegrade and retrograde coronary sinus  cardioplegia.  The patient was cooled to 32 degrees.  The aortic cross-clamp  was applied, and 800 cc of cold blood cardioplegia was delivered, with good  cardioplegic arrest and septal temperature dropping less than 12 degrees.  Topical iced saline was used to augment myocardial preservation.   The distal coronary anastomoses were then performed.  The first distal  anastomosis was to the distal right coronary.  This is a 1.5 mm vessel.  A  reverse saphenous vein was sewn end-to-side with a running 7-0 Prolene.  The  saphenous vein was dilated to a diameter of approximately 5 mm.  There was  good flow through graft.  The second distal anastomosis was to the diagonal  branch of the LAD.  This was a 1.5 mm vessel, with a proximal 80-90%  stenosis.  A reverse saphenous vein was sewn end-to-side with a  running 7-0  Prolene.  There was good flow through graft.  Cardioplegia was re-dosed.  The third distal anastomosis was to an intramyocardial obtuse marginal which  was totally occluded proximally.  A reverse saphenous vein was sewn end-to-  side with a running 7-0 Prolene.  There was good flow through graft.  Cardioplegia was re-dosed.  The fourth distal anastomosis was to the distal  third of the LAD.  It had a 90% proximal stenosis.  The left IMA pedicle was  brought through an opening created in the left lateral pericardium.  It was  brought down onto the LAD and sewn end-to-side with a running 8-0 Prolene.  There was good flow through the anastomosis after brief release of the  pedicle bulldog on the mammary artery.  The mammary pedicle bulldog was  replaced and the pedicle secured to the epicardium.  Cardioplegia was re-  dosed.   While the crossclamp was still in place, three proximal vein anastomoses  were performed on the ascending aorta using a 4.5 mm punch with a running 6- 0 Prolene.  Prior to tying down the final proximal  anastomosis, air was  vented from the coronaries and the left side of the heart using the usual de-  airing maneuvers on bypass.  The final proximal anastomosis was tied down,  and the crossclamp was removed.   The heart resumed a spontaneous rhythm.  Air was aspirated from the vein  grafts with a 27-gauge needle.  The cardioplegia cannulas were removed.  Hemostasis was documented at the proximal and distal anastomoses.  The  patient was rewarmed to 37 degrees.  Temporary pacing wires were applied.  The lungs were re-expanded, and the ventilator was resumed.  The patient was  weaned from bypass without difficulty on renal-dose dopamine.  Blood  pressure and cardiac output were stable.  Protamine was administered without  adverse reaction.  The cannula was removed.  The mediastinum was irrigated  with warm antibiotic irrigation.  The leg incision was irrigated and closed  in a standard fashion.  Two mediastinal and a left pleural chest tube were  placed and brought out through separate incisions.  The sternum was closed  with interrupted steel wire.  The pectoralis fascia was closed with a  running #1 Vicryl.  The subcutaneous and skin layers were closed with a  running Vicryl, and sterile dressings were applied.  Total bypass time was  135 minutes, with crossclamp time of 95 minutes.      Kerin Perna, M.D.  Electronically Signed     PV/MEDQ  D:  04/10/2006  T:  04/12/2006  Job:  [id number]   cc:   [facility]  Armanda Magic, M.D.

## 2010-12-03 NOTE — Discharge Summary (Signed)
NAME:  Joshua Chambers, Joshua Chambers NO.:  [id number]   MEDICAL RECORD NO.:  [id number]          PATIENT TYPE:  INP   LOCATION:  2041                         FACILITY:  [facility]   PHYSICIAN:  Kerin Perna, M.D.  DATE OF BIRTH:  [birth year]   DATE OF ADMISSION:  04/07/2006  DATE OF DISCHARGE:                                 DISCHARGE SUMMARY   This is an addendum to patient's discharge summary dictated on April 16, 2006.  Patient was planned ready to be transferred back to nursing home on  April 18, 2006, postop day #8.  Transfer was held due to patient's  pulmonary status.  The patient on the evening of postop day #7 desatted down  to the 80s and complained of shortness of breath.  The patient was placed  back on 2 liters of nasal cannula, and shortness of breath improved.  He  remained on nasal cannula overnight and was able to be weaned off.  Bilateral lower extremity duplex ultrasound was done to rule out DVT.  This  was negative for DVT.  Cardiac rehab ambulated the patient on the evening of  postop day #8, and sats remained in the mid 90s.  Patient denied any  shortness of breath with ambulation.   As stated in the discharge summary, patient did have urinary retention  following surgery.  A Foley was replaced postoperatively.  It was DC'd on  postop day #8, October 2nd.  The patient was able to void following DC of  Foley.  Bladder scan was done prior to patient voiding, showing a total of  490 cc.  Patient was able to void 100 cc.  The bladder was rescanned and  showed a residual 423 cc remaining in the bladder.  At that time, the Foley  catheter was reinserted.  The plan is to leave the Foley catheter and follow  up with Dr. Etta Grandchild, urology, as an outpatient following discharge from the  hospital.  The patient also developed hypotension the evening of postop day  #8.  This was resolved with IV fluids.  The patient did develop several  episodes with systolic blood  pressure dropping in the 70s.  Again, IV fluids  were given, and the patient's blood pressure improved.  It was felt that  this was due to the Flomax being given in the a.m.  The Flomax was switched  to be given p.o. nightly.   The plan will be to monitor the patient's blood pressure this morning.  We  will plan to transfer to [facility] later today, postop day 9,  October 3rd versus on a.m. of postop day #10.  This will be done once  patient's blood pressure shows to be stable.  See dictated discharge summary  for instructions and discharge medications.  Again, the Flomax will be  switched to 0.4 mg p.o. nightly, the only change in medications.      [name], [name]      Kerin Perna, M.D.  Electronically Signed    KMD/MEDQ  D:  04/19/2006  T:  04/19/2006  Job:  [id number]

## 2010-12-03 NOTE — Consult Note (Signed)
NAME:  Joshua Chambers, [place] NO.:  [id number]   MEDICAL RECORD NO.:  [id number]          PATIENT TYPE:  INP   LOCATION:  2020                         FACILITY:  [facility]   PHYSICIAN:  Kerin Perna, M.D.  DATE OF BIRTH:  03-08-1923   DATE OF CONSULTATION:  04/07/2006  DATE OF DISCHARGE:                                   CONSULTATION   PHYSICIAN REQUESTING CONSULTATION:  Armanda Magic, M.D.   PRIMARY CARE PHYSICIAN:  Candyce Churn, M.D.   CONSULTANT:  Kerin Perna, M.D.   REASON FOR CONSULTATION:  Severe two vessel coronary artery disease with  Class III angina equivalent.   CHIEF COMPLAINT:  Weakness on exertion.   HISTORY OF PRESENT ILLNESS:  I was asked to evaluate this 77 year old white  male patient for potential surgical coronary revascularization for recently  diagnosed severe coronary artery disease.  The patient underwent cardiac  catheterization in 2005, at which time a proximal LAD stenosis of 70% was  noted.  Medical management was recommended.  Over the past few months the  patient has had progressive decreased exercise tolerance with shortness of  breath and fatigue on minimal exertion.  He denies any resting symptoms.  He  denies any specific chest pain.  He denies symptoms of orthopnea, PND or  ankle edema.  A stress test was performed which showed possible ischemic  changes and he underwent cardiac catheterization today by Dr. Mayford Knife.  This  demonstrated EF of 50%.  He had a proximal 90% stenosis of the LAD diagonal.  He had a dominant right coronary with a mid 80% stenosis.  The circumflex  had some distal small vessel disease.  There is no evidence of aortic valve  gradient or mitral regurgitation.  Because of the patient's progression of  coronary disease and Class III symptoms, he was felt to be a candidate for  surgical revascularization as the proximal LAD stenosis was not amenable to  PCI due to the close proximity to the left  main.   PAST MEDICAL HISTORY:  1. GERD.  2. Dyslipidemia.  3. BPH status post TURP.  4. Intolerance to statins.   HOME MEDICATIONS:  1. Multivitamin.  2. Aspirin 81 mg daily.  3. Crestor 5 mg daily, currently on hold.   SOCIAL HISTORY:  The patient is married.  His wife is a resident at [facility] following back surgery and with associated dementia.  The  patient has never smoked and is a retired Psychologist, occupational.  He is quite active and  takes care of the home.  He used to swim at the health fitness center until  his weakness and exercise intolerance became significant.  He denies  significant alcohol intake.   FAMILY HISTORY:  Several members of his family lived past age 75.   ALLERGIES:  NONE, EXCEPT INTOLERANCE TO STATINS.   REVIEW OF SYSTEMS:  CONSTITUTIONAL:  Negative for significant weight change  or fever.  ENT:  Negative for change in vision or difficulty swallowing.  He  sees a dentist twice a year and has intact teeth.  THORACIC:  Negative for  chest trauma or abnormal chest x-ray with pulmonary nodule or symptoms of  URI.  CARDIAC:  Positive for his coronary disease which has progressed over  the past 2 years.  He has no history of cardiac murmur or rheumatic heart  disease.  GI:  Positive for GERD.  Negative for jaundice, hepatitis, blood  per rectum.  UROLOGIC:  Positive for BPH, improved after TURP with nocturia  x1.  No history of prostate cancer.  ENDOCRINE:  Negative for diabetes or  thyroid disease.  VASCULAR:  Negative for TIA, DVT or claudication.  NEUROLOGIC:  Negative for stroke or seizure.  He has had some dizziness and  has undergone MRI of the head twice in the past 2-3 years.  This has shown  small vessel disease.  He denies any bleeding disorders.   PHYSICAL EXAM:  Patient is 6 feet 3 inches and weighs 228 pounds.  Blood  pressure is 130/80, pulse 66, temperature 97.1, room air saturation is 95%.  GENERAL APPEARANCE:  That of a pleasant elderly man  in his hospital room  following cardiac catheterization, in no distress.  HEENT EXAM:  Normocephalic.  Dentition good.  Pharynx clear.  NECK:  Without JVD, mass or carotid bruit.  LYMPHATICS:  No palpable supraclavicular or axillary adenopathy.  THORAX:  Without deformity.  BREATH SOUNDS:  Clear and equal.  CARDIAC EXAM:  Regular rhythm without S3 gallop or murmur.  ABDOMINAL EXAM:  Soft without pulsatile mass, organomegaly or focal  tenderness.  He has a compression dressing in the right groin at the site of  cardiac cath.  EXTREMITIES:  Are otherwise without clubbing, edema or cyanosis.  Peripheral  pulses are 1-2+ in all extremities and there is no evidence of venous  insufficiency of the lower extremities.  NEUROLOGIC EXAM:  Alert and oriented and there is no focal motor deficit.  His gait is not tested as he is currently at bedrest following cardiac  catheterization.   LABORATORY DATA:  Chest x-ray showed no acute disease.  His hematocrit is 42  and his creatinine and BUN are 0.9 and 13, platelet count 300,000.  I have  reviewed the coronary arteriograms and agree with Dr. Norris Cross assessment of  severe LAD-diagonal stenosis and moderate to severe mid right coronary  artery stenosis with __________ preserved LV function.   IMPRESSION AND PLAN:  The patient would benefit from surgical  revascularization with bypass grafts to the LAD diagonal and right coronary  arteries.  Surgery will be scheduled for the afternoon of Monday, September  26th.  I have discussed the details of surgery in full with the patient and  his son including the alternatives to surgery, the details of the operation  and the expected recovery phase and the associated risks.  The patient is in  understanding and agrees to proceed with the operation.   Of note, the patient's carotid Doppler studies are pending and will probably  be performed the morning of surgery.      Kerin Perna,  M.D. Electronically Signed    PV/MEDQ  D:  04/07/2006  T:  04/09/2006  Job:  [id number]

## 2010-12-03 NOTE — Op Note (Signed)
NAME:  Joshua Chambers, Joshua Chambers                           ACCOUNT NO.:  [id number]   MEDICAL RECORD NO.:  [id number]                   PATIENT TYPE:  OIB   LOCATION:  2856                                 FACILITY:  [facility]   PHYSICIAN:  Armanda Magic, M.D.                  DATE OF BIRTH:  05/11/1923   DATE OF PROCEDURE:  03/15/2004  DATE OF DISCHARGE:                                 OPERATIVE REPORT   REFERRING PHYSICIAN:  Candyce Churn, M.D.   PROCEDURE:  Left heart catheterization, coronary angiography, left  ventriculography.   OPERATOR:  Armanda Magic, M.D.   INDICATIONS:  Chest pain.   COMPLICATIONS:  None.   INTRAVENOUS ACCESS:  Via right femoral artery 6-French sheath.   HISTORY:  This is a 77 year old white male with a previous abnormal stress  Cardiolite study a year ago in the setting of chest pain but refused  catheterization at that time. He has had recurrent chest pain and now  presents for cardiac catheterization for evaluation.   The patient was brought to the cardiac catheterization laboratory in the  fasting nonsedated stated. Informed consent was obtained. The patient was  connective to continuous heart rate and pulse oximetry monitoring and  intermittent blood pressure monitoring. The right groin was prepped and  draped in sterile fashion. One percent Xylocaine was used for local  anesthesia. Using modified Seldinger technique, a 6-French sheath was placed  in the right femoral artery. Under fluoroscopic guidance, a 6-French JL4  catheter was placed in the left coronary ostium but could not be engaged  adequately, so it was exchanged over a guide wire for a 6-French JL5 which  successfully engaged the left coronary ostium. Multiple cine films were  taken in 30 degree RAO and 40 degree LAO views. This catheter was then  exchanged out over a guide wire for a 6-French JR4 catheter which was placed  under fluoroscopic guidance in the right coronary artery. Multiple  cine  films were taken in 30 degree RAO and 40 degree LAO views. This catheter was  then exchanged out over a guide wire for a 6-French angled pigtailed  catheter which was placed under fluoroscopic guidance in the left  ventricular cavity. Left ventriculography was performed in 30 degree RAO  views with a total of 30 cc of contrast at 15 cc per second. The catheter  was then pulled back across the valve with no significant gradient. At the  end of the procedure, all catheters and sheaths were removed. Manual  compression was performed until adequate hemostasis was obtained. The  patient was transferred back to the room in stable condition.   RESULTS:  Left main coronary artery is widely patent and bifurcates into the  left anterior descending artery and left circumflex artery. Left anterior  descending artery has a complex lesion in the proximal LAD at the take off  of the first  diagonal. There is haziness in this area which is consistent  with either a very small plaque rupture or an occluded diagonal. There is a  70% proximal narrowing just distal to the take off of the LAD. There has  been a second diagonal branch which is widely patent, and the rest of the  LAD traverses to the apex and is widely patent. Left circumflex is somewhat  small but widely patent and bifurcates into a first OM which is patent.   Right coronary artery is diffusely diseased, up to 40% in the mid portion,  and then bifurcates into the posterior descending artery, both of which are  widely patent. Left ventriculography shows normal LV systolic function, EF  55%. LV pressure 138/-1, aortic pressure 156/93. LVEDP 11 mmHg.   ASSESSMENT:  1.  One vessel obstructive coronary disease in an area not amendable to      percutaneous coronary intervention due to complexity of lesion and      proximity of the left main.  2.  Normal left ventricular function.   PLAN:  1.  Discharge to home after IV fluids and bedrest.  Aspirin q.d. Imdur 30 mg      q.d. Outpatient stress Cardiolite to evaluate for ischemia in the LAD      territory. If positive, the patient needs LIMA to the LAD graft.  2.  Check out patient's fasting lipid panel.      ___________________________________________                                            Armanda Magic, M.D.   TT/MEDQ  D:  03/15/2004  T:  03/15/2004  Job:  [id number]

## 2010-12-03 NOTE — Discharge Summary (Signed)
NAME:  Joshua Chambers, [place] NO.:  [id number]   MEDICAL RECORD NO.:  [id number]          PATIENT TYPE:  INP   LOCATION:  6729                         FACILITY:  [facility]   PHYSICIAN:  Candyce Churn, M.D.DATE OF BIRTH:  09-Jan-1923   DATE OF ADMISSION:  12/09/2004  DATE OF DISCHARGE:                                 DISCHARGE SUMMARY   DISCHARGE DIAGNOSES:  1.  Febrile illness - resolved.  2.  Dehydration - resolved.  3.  Mild transaminitis - questioned etiology, possibly secondary to Vytorin      therapy.  Workup to continue as an outpatient.  4.  Single vessel coronary artery disease per cardiac catheterization in      2005.  5.  Gastroesophageal reflux disease.  6.  Recurrent prostatitis - possibly cause of current febrile illness.  7.  Diverticulosis.  8.  Chronic constipation.  9.  Hypercholesterolemia.  10. History of benign prostatic hypertrophy with transurethral resection of      prostate x2-3.  11. Episodic benign positional vertigo.   DISCHARGE MEDICATIONS:  1.  Imdur 30 mg daily.  2.  Aspirin 81 mg daily.  3.  Aciphex 20 mg daily.  4.  Ceftin 500 mg b.i.d. for 10 days.  5.  Hold Vytorin for now.   PROCEDURES:  Abdominal ultrasound - Dec 10, 2004 - gallbladder appeared  normal, no stones, bile duct 6.7 mm.  Liver and the inferior vena cava were  normal.  Pancreas was obscured by bowel gas.  Spleen and aorta are normal.  Right kidney is normal size with a 2.5 x 2.3 cm cyst on the right lower  pole.  The left kidney measures 12.9 cm, and there is no hydronephrosis.   HOSPITAL COURSE:  Mr. Khalid Lacko is a pleasant 77 year old male who  developed fever, dysuria, and frequency approximately two days prior to  admission.  It was felt that this might be a recurrent prostatitis, in that  he has had this before.  He was started on ciprofloxacin.  Unfortunately, he  developed nausea and vomiting on the day prior to admission and then could  not get out of  bed on his own on the morning of admission, and he was  brought by ambulance to [facility] Emergency Room, where he was found to  have a blood pressure systolic of 91/65.  Temperature was 102.1.   No obvious focal etiology for fever could be found.  It was felt that he  might have a recurrent prostatitis.  He was started on intravenous Rocephin.   He also was started on IV hydration, and, within 24 hours, he was much  improved, although too weak to stand safely, and with one more day of IV  antibiotics and IV fluids, he is now able to stand and ambulate, and he is  afebrile.   Blood cultures were negative at 48 hours, and urine culture was negative at  48 hours.   One persistent abnormality on laboratories and x-rays was a mild  transaminitis with AST initially being 120 on admission and then 87 and  then  88 on subsequent checks.  His ALT was 136 initially and then 124 and then  140.  Albumin was 3.3 on admission and 2.9 on discharge.   The patient has just started to eat well.   Hepatitis B surface antigen, iron studies, and hepatitis C antibody will be  drawn prior to discharge, and I will hold his Vytorin and plan to see him  back in the office in one week.   Again, we will continue Ceftin for 10 more days for the possibility of  prostatitis.   The patient was discharged in a much improved condition in the accompaniment  of his son.       RNG/MEDQ  D:  12/11/2004  T:  12/11/2004  Job:  [id number]

## 2010-12-03 NOTE — H&P (Signed)
NAME:  Joshua Chambers, Joshua Chambers NO.:  [id number]   MEDICAL RECORD NO.:  [id number]          PATIENT TYPE:  INP   LOCATION:  6729                         FACILITY:  [facility]   PHYSICIAN:  Candyce Churn, M.D.DATE OF BIRTH:  06-Jun-1923   DATE OF ADMISSION:  12/09/2004  DATE OF DISCHARGE:                                HISTORY & PHYSICAL   CHIEF COMPLAINT:  Fever for 24 to 48 hours with dysuria, urinary frequency,  and severe weakness with inability to stand.   HISTORY OF PRESENT ILLNESS:  Joshua Chambers is a very pleasant 77 year old  male with inability to stand or walk with fever for 2 days. He has had some  nausea and vomiting which started after starting ciprofloxacin for urinary  frequency and dysuria. He has a history for current prostatitis.   He is being admitted for inability to stand and fever to 102.1.   MEDICATIONS:  1.  Imdur 30 mg a day.  2.  Aspirin 81 mg a day.  3.  Vytorin 10/40 one daily.  4.  Aciphex 20 mg daily.   PAST MEDICAL HISTORY:  1.  Single vessel coronary artery disease.  2.  GERD.  3.  Recurrent prostatitis.  4.  Diverticulosis.  5.  History of constipation.  6.  Hypercholesterolemia.  7.  BPH with TURP on multiple occasions.  8.  History of benign positional vertigo intermittently.   FAMILY HISTORY:  Significant for Parkinson's disease, peripheral vascular  disease, osteoporosis, and a question of colon cancer.   SOCIAL HISTORY:  The patient lives alone but is married. His wife lives at  [facility] because of chronic medical problems and psychiatric problems. He  has a supportive family in [place] with his son living in [place] as  well who can be reached at [phone].   REVIEW OF SYSTEMS:  He denies any chest pain or headache. He had a little  shortness of breath on the morning of admission. He does have a history of  mild COPD. He also has a history of renal calculi. He denies any orthopnea.  He has had some nausea and  vomiting on the day prior to admission and he has  been having some chills. No melena or bright red blood per rectum.   PHYSICAL EXAMINATION:  GENERAL:  He is alert but extremely weak. He is lying  supine on a stretcher in the emergency room.  VITAL SIGNS:  Temperature is 102.1, pulse is 106 and regular, respiratory  rate is 20 and unlabored, blood pressure is 91/65.  HEENT:  Benign with oropharynx clear, tongue normal color, moderately dry,  no saliva present.  NECK:  Supple without JVD, no thyromegaly, and carotids are 2+ bilaterally,  no masses.  CHEST:  Clear to auscultation.  CARDIAC:  Reveals a regular rhythm, no murmurs or gallops.  ABDOMEN:  Soft, nontender, does have an old appendectomy scar.  RECTAL:  Not performed secondary to patient being in the hallway in a very  public manner. He does not complain of any genitourinary or rectal pain.  EXTREMITIES:  No cyanosis, clubbing,  or edema.  SKIN:  No rashes, purpura, or petechiae.   LABORATORY DATA:  A chest x-ray reveals COPD with chronic bronchitis and  chronic interstitial lung disease with no change from August 2005. CBC  revealed a white count of 8800, hemoglobin 13.1, MCV 91, RDW 13.8, platelet  count 178,000. He had 95% neutrophils on differential. Sodium was 136,  potassium 4.0, chloride 103, bicarb 25, glucose 133, BUN 15, creatinine 1.0.  Calcium 8.5, total protein 5.8, albumin 3.3. Liver functions were elevated  with an AST of 120, ALT of 136, alkaline phosphatase of 158, total bilirubin  0.9. CK was 25, CK-MB 0.4, and troponin was less than 0.02. Urinalysis  revealed 15 mg/dL of ketones but otherwise within normal limits. Urine  culture and blood cultures were pending.   ASSESSMENT:  A 77 year old male with fever and prostration. He does have  some hepatocellular inflammation which may be secondary to Vytorin and/or  Cipro.   PLAN:  Admit, treated with IV hydration. He is somewhat hypotensive. We will  start  Rocephin 2 g IV q.24h., Tylenol for fever, and Restoril for insomnia.  Phenergan will be used for nausea and/or vomiting.   We will follow up on cultures and LFTs, and we will plan to discontinue to  hold Vytorin for now.   We would like to send a specimen for hepatitis C antibody and hepatitis B  surface antigen and will plan for an abdominal ultrasound.       RNG/MEDQ  D:  12/11/2004  T:  12/11/2004  Job:  [id number]

## 2010-12-03 NOTE — Discharge Summary (Signed)
NAME:  Joshua Chambers, Joshua Chambers NO.:  [id number]   MEDICAL RECORD NO.:  [id number]          PATIENT TYPE:  INP   LOCATION:  2041                         FACILITY:  [facility]   PHYSICIAN:  Jerold Coombe, P.A.DATE OF BIRTH:  08/16/1922   DATE OF ADMISSION:  04/07/2006  DATE OF DISCHARGE:                                 DISCHARGE SUMMARY   DATE OF DISCHARGE:  Anticipated date of discharge is April 17, 2006, or  April 18, 2006.   ADMISSION DIAGNOSES:  Class IV unstable angina with left main and three-  vessel coronary artery disease.   DISCHARGE/SECONDARY DIAGNOSES:  1. Class IV unstable angina with left main and three-vessel coronary      artery disease, status post coronary artery bypass graft.  2. Postoperative hypotension, resolved.  3. Postoperative atrial fibrillation, currently in sinus rhythm.  4. Mild postoperative ileus, resolved.  5. Postoperative acute blood loss anemia, requiring a transfusion.  6. Postoperative urinary retention, requiring reinsertion of Foley      catheter, with history of benign prostatic hypertrophy, status post      transurethral resection of prostate.  7. Dyslipidemia.  8. Gastroesophageal reflux disease.  9. Intolerance to statins.  10.Postoperative fluid volume excess, improving.  11.Mild postoperative hyperglycemia, with hemoglobin A1c of 5.9.   PROCEDURES:  1. 04/10/2006 - Coronary artery bypass grafting x4, using a left internal      mammary artery to the LAD, saphenous vein graft to the diagonal,      saphenous vein graft to the obtuse marginal and saphenous vein graft to      the right coronary artery.  Endoscopic vein harvesting from the right      leg.  Surgeon Dr. Kathlee Nations Trigt.  2. Carotid duplex showing bilateral mild mixed plaque origin in the      internal carotid arteries, but no internal carotid artery stenosis with      vertebral flow antegrade.  3. 04/12/2006 - PICC line insertion, right basilic vein.   BRIEF HISTORY:  Joshua Chambers is an 77 year old Caucasian male who is status  post cardiac catheterization 2005 with results at that time showing LAD  stenosis of 70%, which was managed medically.  The past few months he had  progressive increased exercise intolerance with shortness of breath and  fatigue on minimal exertion.  He denied resting symptoms.  He denied resting  symptoms.  He also denied any specific chest pain, orthopnea, PND or ankle  edema.  A stress test was performed showing possible ischemic changes and  subsequently underwent cardiac catheterization on 04/07/2006 by Dr. Armanda Magic.  This demonstrated an ejection fraction of 50%, and proximal 90%  stenosis of the LAD and diagonal.  He had a dominant right coronary artery  with mid 80% stenosis.  The circumflex with some distal small vessel  disease.  There was no evidence of aortic valve gradient or mitral  regurgitation.  Because of the patient's progression of coronary disease, he  is felt to be a candidate for surgical revascularization as a proximate LAD  stenosis was not amenable  to PCI due to the close proximity to his left main  vessel.  He is evaluated by Dr. Kathlee Nations Trigt, who explained the risks and  benefits of the procedure, and the patient agreed to proceed.   HOSPITAL COURSE:  As mentioned, Joshua Chambers was admitted to [facility] on 04/07/2006, following a finding of severe coronary artery  disease following a cardiac catheterization.  Surgery was planned for  04/10/2006.  In the meantime, he remained on aspirin and IV nitroglycerin.  Crestor was ordered, but it was ultimately discontinued due to intolerance  as he has a long history of intolerance to multiple statins.  Over the next  several days, Joshua Chambers remained stable and was taken to the operating room  on 04/10/2006 with details outlined above.  Postoperatively he was  transferred to the surgical intensive care unit where he remained for the   first few days.  Initially, he did have some postoperative hypertension  requiring dopamine and Neo-Synephrine.  He was extubated neurologically  intact, however.  Note, he also had some diffuse ST changes on his EKG  consistent with epicarditis/pericarditis.  There was no obvious signs of  bleeding, although he did have a decrease in his creatinine with a  hemoglobin 26 and required 1 transfusion, and 1 unit of red packed blood  cells.  Otherwise, his chest tubes were discontinued on postoperative day 1.  Shortly after surgery he was noted to have some abdominal distention with  mild tenderness and emesis consistent with a postoperative ileus.  His liver  function tests were normal other than a mildly elevated total bilirubin of  1.3.  His diet was advanced slowly and narcotics were discontinued, and he  was treated with Dulcolax suppositories and within a few days his bowel  function was returning.  In regards to his hypotension, ultimately he did  undergo a 2D echocardiogram, which did rule out pericardial effusion.  Ultimately, he was weaned from neo-Synephrine and dopamine and was able to  tolerate a low-dose metoprolol.  He was also able to tolerate daily  furosemide for all postoperative fluid volume excess, but was down to his  preoperative weight baseline at discharge.  On postoperative day 4, Mr.  Chambers was felt appropriate for discharge out of the surgical intensive care  unit out to the floor.  Note, he had developed some atrial fibrillation the  night before with rapid ventricular response requiring IV amiodarone, but  this time, he was back in normal sinus rhythm and has been maintaining  normal sinus on oral amiodarone and low-dose metoprolol since.  Just  previous to his transfer out to the floor, he was seen by his urologist, as  he reported some problems with urinary retention.  A Foley catheter was placed and approximately 1000 mL of urine was returned, with urine  culture  still pending at the time of this dictation.  He was started on Flomax and  peridium.  At the time of this dictation, Joshua Chambers remains with a Foley  catheter with plans for him to be reevaluated by his urologist, Dr. Javier Glazier, on Monday, April 17, 2006, to finalize plans whether he will be  discharged home with the Foley catheter.   Once on unit 2000, Joshua Chambers made good progress.  He remained stable with  mid sinus rhythm in the 80s and 90s, with a rate in the 80s and 90s.  His  last vital signs show a blood pressure of  120/74.  He has been afebrile and  saturating 92% on room air.  Overall, his labs have remained stable.  He did  have some hyperglycemia initially postoperatively, but did not require long-  term sliding scale insulin and sugars within the last 24 hours have been  around 110-130.  His hemoglobin and hematocrit are still decreased but  stable at 8.6 and 25 respectively, and he had been started on iron  supplementation.  He has maintained adequate urine output with a creatinine  at 0.9.  He did require some potassium supplementation for a slightly  decreased potassium at 3.7.  He has been able to ambulate in the hallways  with a rolling walker and assistance with the cardiac rehab.  He does live  alone and prior to surgery, had been making plans with his family for him to  go to [employer] in [place], as his wife is a resident  there.  Final arrangements have not been made at this time, but hopefully  these will be arranged in the next 1-2 days.  Joshua Chambers pain has been  controlled.  His bowels function has returned.  He is tolerating regular  diet.   EXAM:  His heart has a regular rate and rhythm.  Lung sounds were clear.  Extremities showed only mild edema, which is improving.  His incisions were  healing well without signs of infection.   DISPOSITION:  Once Joshua Chambers has been reevaluated by Dr. Etta Grandchild and final  decision is made  about his Foley catheter, and once arrangements are  arranged at [facility] in [facility], it is anticipated  Joshua Chambers will be ready for discharge hopefully on April 17, 2006, or  April 18, 2006.   LABORATORY DATA:  Most recent labs on September 29th show a white blood  count of 8.0, hemoglobin 8.6, hematocrit 25.0, platelet count 165, sodium of  136, potassium of 3.7, which was supplemented, chloride 102, CO2 of 26, BUN  19, creatinine 0.9, blood glucose 95, AST 26, ALT 15, alkaline phosphatase  44, total bilirubin of 1.3, amylase of 105.  Albumin of 2.8.  Urine cultures  still pending.  Hemoglobin A1c 5.9.   His latest chest x-ray on September 29th, showed a small bilateral pleural  effusion, left greater than right.   DISCHARGE MEDICATIONS:  1. Aspirin 81 mg p.o. every day.  2. Toprol XL 25 mg p.o. every day.  3. Flomax 0.4 mg p.o. every day.  4. Niferex 150 mg p.o. every day. 5. Multivitamin 1 p.o. every day.  6. Amiodarone 200 mg 2 tablets p.o. b.i.d. times 7 days, then decrease to      1 tablet p.o. b.i.d.  7. Ultram 50 mg 1-2 tablets p.o. every 6 hours p.r.n. pain.   DISCHARGE INSTRUCTIONS:  He is to avoid driving or heavy lifting more than  10 pounds and encouraged to continue daily walking and breathing exercises.  To follow a low-fat, low-salt diet.  He may take a shower and cleanse his  incision gently with soap and water.  His incision should be cleaned daily  with mild soap and water.  The [facility] office should be notified if he develops  fever greater than 101 or redness or drainage from incision site, increasing  edema, or shortness of breath.   FOLLOWUP:  1. He is due to see Dr. Donata Clay at the [facility] office in approximately 3      weeks.  The office will  contact him regarding a specific appointment      date and time.  2. He is to follow up with Dr. Armanda Magic at [facility] on May 02, 2006, at 11 a.m., but should arrive at 10  a.m. for a chest x-ray.      Jerold Coombe, P.A.     AWZ/MEDQ  D:  04/16/2006  T:  04/16/2006  Job:  [id number]   cc:   Kerin Perna, M.D.  Claudette Laws, M.D.  Armanda Magic, M.D.  Candyce Churn, M.D.

## 2011-04-12 LAB — URINALYSIS, ROUTINE W REFLEX MICROSCOPIC
Nitrite: NEGATIVE
Specific Gravity, Urine: 1.012
Urobilinogen, UA: 0.2
pH: 7

## 2011-04-12 LAB — CBC
MCV: 92.8
Platelets: 281
WBC: 7.8

## 2011-04-12 LAB — DIFFERENTIAL
Basophils Absolute: 0
Basophils Relative: 0
Eosinophils Absolute: 0.1
Lymphs Abs: 1.2
Neutro Abs: 5.8
Neutrophils Relative %: 75

## 2011-04-12 LAB — PROTIME-INR
INR: 0.9
Prothrombin Time: 12.6

## 2011-04-12 LAB — URINE MICROSCOPIC-ADD ON

## 2011-04-26 LAB — DIFFERENTIAL
Basophils Relative: 1
Eosinophils Absolute: 0.1 — ABNORMAL LOW
Eosinophils Relative: 2
Lymphs Abs: 1
Monocytes Relative: 9

## 2011-04-26 LAB — URINALYSIS, ROUTINE W REFLEX MICROSCOPIC
Ketones, ur: 40 — AB
Nitrite: POSITIVE — AB
Protein, ur: 100 — AB
pH: 5.5

## 2011-04-26 LAB — CBC
HCT: 39.2
MCHC: 34.4
MCV: 91
RBC: 4.3
WBC: 5.1

## 2011-04-26 LAB — URINE CULTURE

## 2011-05-05 LAB — CBC
HCT: 38 — ABNORMAL LOW
HCT: 38.3 — ABNORMAL LOW
Hemoglobin: 12.3 — ABNORMAL LOW
Hemoglobin: 12.4 — ABNORMAL LOW
Hemoglobin: 13.1
MCHC: 34.1
MCHC: 34.3
MCV: 91.5
MCV: 91.5
Platelets: 259
Platelets: 267
RBC: 4.01 — ABNORMAL LOW
RBC: 4.19 — ABNORMAL LOW
RDW: 13.9
WBC: 4.1
WBC: 5.2
WBC: 6.6

## 2011-05-05 LAB — CK TOTAL AND CKMB (NOT AT ARMC)
CK, MB: 3
Relative Index: INVALID
Total CK: 45

## 2011-05-05 LAB — BASIC METABOLIC PANEL
BUN: 17
BUN: 6
CO2: 29
Calcium: 7.7 — ABNORMAL LOW
Calcium: 8 — ABNORMAL LOW
Calcium: 8.5
Chloride: 104
Chloride: 104
Chloride: 105
Chloride: 105
Creatinine, Ser: 0.78
Creatinine, Ser: 0.78
Creatinine, Ser: 0.84
GFR calc Af Amer: >60
GFR calc Af Amer: >60
GFR calc Af Amer: >60
GFR calc non Af Amer: >60
Potassium: 3.9
Potassium: 4.1
Sodium: 138

## 2011-05-05 LAB — COMPREHENSIVE METABOLIC PANEL
ALT: 17
AST: 15
Alkaline Phosphatase: 68
CO2: 28
Calcium: 8.4
Chloride: 105
GFR calc Af Amer: >60
GFR calc non Af Amer: >60
Glucose, Bld: 118 — ABNORMAL HIGH
Potassium: 4
Sodium: 139

## 2011-05-05 LAB — B-NATRIURETIC PEPTIDE (CONVERTED LAB): Pro B Natriuretic peptide (BNP): 51.9

## 2011-05-05 LAB — CARDIAC PANEL(CRET KIN+CKTOT+MB+TROPI)
CK, MB: 2.1
Troponin I: 0.02

## 2011-05-05 LAB — TROPONIN I: Troponin I: 0.02

## 2011-05-05 LAB — TYPE AND SCREEN: ABO/RH(D): O POS

## 2011-05-05 LAB — ABO/RH: ABO/RH(D): O POS

## 2011-07-28 DIAGNOSIS — E782 Mixed hyperlipidemia: Secondary | ICD-10-CM | POA: Diagnosis not present

## 2011-07-28 DIAGNOSIS — I2510 Atherosclerotic heart disease of native coronary artery without angina pectoris: Secondary | ICD-10-CM | POA: Diagnosis not present

## 2011-07-29 DIAGNOSIS — R609 Edema, unspecified: Secondary | ICD-10-CM | POA: Diagnosis not present

## 2011-07-29 DIAGNOSIS — E782 Mixed hyperlipidemia: Secondary | ICD-10-CM | POA: Diagnosis not present

## 2011-07-29 DIAGNOSIS — Z951 Presence of aortocoronary bypass graft: Secondary | ICD-10-CM | POA: Diagnosis not present

## 2011-08-15 DIAGNOSIS — M7989 Other specified soft tissue disorders: Secondary | ICD-10-CM | POA: Diagnosis not present

## 2011-09-21 DIAGNOSIS — N401 Enlarged prostate with lower urinary tract symptoms: Secondary | ICD-10-CM | POA: Diagnosis not present

## 2011-09-21 DIAGNOSIS — R310 Gross hematuria: Secondary | ICD-10-CM | POA: Diagnosis not present

## 2011-10-24 DIAGNOSIS — Z79899 Other long term (current) drug therapy: Secondary | ICD-10-CM | POA: Diagnosis not present

## 2011-10-24 DIAGNOSIS — E782 Mixed hyperlipidemia: Secondary | ICD-10-CM | POA: Diagnosis not present

## 2011-11-29 DIAGNOSIS — I2510 Atherosclerotic heart disease of native coronary artery without angina pectoris: Secondary | ICD-10-CM | POA: Diagnosis not present

## 2011-11-29 DIAGNOSIS — E782 Mixed hyperlipidemia: Secondary | ICD-10-CM | POA: Diagnosis not present

## 2011-11-29 DIAGNOSIS — Z951 Presence of aortocoronary bypass graft: Secondary | ICD-10-CM | POA: Diagnosis not present

## 2012-01-24 DIAGNOSIS — Z79899 Other long term (current) drug therapy: Secondary | ICD-10-CM | POA: Diagnosis not present

## 2012-01-24 DIAGNOSIS — E782 Mixed hyperlipidemia: Secondary | ICD-10-CM | POA: Diagnosis not present

## 2012-03-12 ENCOUNTER — Other Ambulatory Visit: Payer: Self-pay | Admitting: Internal Medicine

## 2012-03-12 DIAGNOSIS — R42 Dizziness and giddiness: Secondary | ICD-10-CM | POA: Diagnosis not present

## 2012-03-12 DIAGNOSIS — I4891 Unspecified atrial fibrillation: Secondary | ICD-10-CM | POA: Diagnosis not present

## 2012-03-12 DIAGNOSIS — R5381 Other malaise: Secondary | ICD-10-CM | POA: Diagnosis not present

## 2012-03-12 DIAGNOSIS — E782 Mixed hyperlipidemia: Secondary | ICD-10-CM | POA: Diagnosis not present

## 2012-03-12 DIAGNOSIS — I2510 Atherosclerotic heart disease of native coronary artery without angina pectoris: Secondary | ICD-10-CM | POA: Diagnosis not present

## 2012-03-12 DIAGNOSIS — R0601 Orthopnea: Secondary | ICD-10-CM | POA: Diagnosis not present

## 2012-03-12 DIAGNOSIS — Z79899 Other long term (current) drug therapy: Secondary | ICD-10-CM | POA: Diagnosis not present

## 2012-03-12 DIAGNOSIS — I499 Cardiac arrhythmia, unspecified: Secondary | ICD-10-CM | POA: Diagnosis not present

## 2012-03-13 DIAGNOSIS — I2510 Atherosclerotic heart disease of native coronary artery without angina pectoris: Secondary | ICD-10-CM | POA: Diagnosis not present

## 2012-03-13 DIAGNOSIS — Z951 Presence of aortocoronary bypass graft: Secondary | ICD-10-CM | POA: Diagnosis not present

## 2012-03-13 DIAGNOSIS — R9431 Abnormal electrocardiogram [ECG] [EKG]: Secondary | ICD-10-CM | POA: Diagnosis not present

## 2012-03-13 DIAGNOSIS — R0989 Other specified symptoms and signs involving the circulatory and respiratory systems: Secondary | ICD-10-CM | POA: Diagnosis not present

## 2012-03-15 ENCOUNTER — Other Ambulatory Visit: Payer: Self-pay

## 2012-03-20 DIAGNOSIS — Z1331 Encounter for screening for depression: Secondary | ICD-10-CM | POA: Diagnosis not present

## 2012-03-20 DIAGNOSIS — R3129 Other microscopic hematuria: Secondary | ICD-10-CM | POA: Diagnosis not present

## 2012-03-20 DIAGNOSIS — R0601 Orthopnea: Secondary | ICD-10-CM | POA: Diagnosis not present

## 2012-03-20 DIAGNOSIS — R5383 Other fatigue: Secondary | ICD-10-CM | POA: Diagnosis not present

## 2012-03-20 DIAGNOSIS — R42 Dizziness and giddiness: Secondary | ICD-10-CM | POA: Diagnosis not present

## 2012-03-20 DIAGNOSIS — I499 Cardiac arrhythmia, unspecified: Secondary | ICD-10-CM | POA: Diagnosis not present

## 2012-03-20 DIAGNOSIS — E782 Mixed hyperlipidemia: Secondary | ICD-10-CM | POA: Diagnosis not present

## 2012-03-20 DIAGNOSIS — I2510 Atherosclerotic heart disease of native coronary artery without angina pectoris: Secondary | ICD-10-CM | POA: Diagnosis not present

## 2012-03-28 DIAGNOSIS — R310 Gross hematuria: Secondary | ICD-10-CM | POA: Diagnosis not present

## 2012-03-28 DIAGNOSIS — N401 Enlarged prostate with lower urinary tract symptoms: Secondary | ICD-10-CM | POA: Diagnosis not present

## 2012-04-17 DIAGNOSIS — R0989 Other specified symptoms and signs involving the circulatory and respiratory systems: Secondary | ICD-10-CM | POA: Diagnosis not present

## 2012-04-20 DIAGNOSIS — Z951 Presence of aortocoronary bypass graft: Secondary | ICD-10-CM | POA: Diagnosis not present

## 2012-04-20 DIAGNOSIS — I2510 Atherosclerotic heart disease of native coronary artery without angina pectoris: Secondary | ICD-10-CM | POA: Diagnosis not present

## 2012-08-08 DIAGNOSIS — E782 Mixed hyperlipidemia: Secondary | ICD-10-CM | POA: Diagnosis not present

## 2012-08-08 DIAGNOSIS — R3129 Other microscopic hematuria: Secondary | ICD-10-CM | POA: Diagnosis not present

## 2012-08-08 DIAGNOSIS — M67919 Unspecified disorder of synovium and tendon, unspecified shoulder: Secondary | ICD-10-CM | POA: Diagnosis not present

## 2012-08-08 DIAGNOSIS — Z0000 Encounter for general adult medical examination without abnormal findings: Secondary | ICD-10-CM | POA: Diagnosis not present

## 2012-08-08 DIAGNOSIS — Z1331 Encounter for screening for depression: Secondary | ICD-10-CM | POA: Diagnosis not present

## 2012-08-08 DIAGNOSIS — R5383 Other fatigue: Secondary | ICD-10-CM | POA: Diagnosis not present

## 2012-08-08 DIAGNOSIS — R42 Dizziness and giddiness: Secondary | ICD-10-CM | POA: Diagnosis not present

## 2012-08-08 DIAGNOSIS — Z79899 Other long term (current) drug therapy: Secondary | ICD-10-CM | POA: Diagnosis not present

## 2012-08-08 DIAGNOSIS — H6120 Impacted cerumen, unspecified ear: Secondary | ICD-10-CM | POA: Diagnosis not present

## 2012-08-13 DIAGNOSIS — R5381 Other malaise: Secondary | ICD-10-CM | POA: Diagnosis not present

## 2012-08-13 DIAGNOSIS — E782 Mixed hyperlipidemia: Secondary | ICD-10-CM | POA: Diagnosis not present

## 2012-08-13 DIAGNOSIS — Z79899 Other long term (current) drug therapy: Secondary | ICD-10-CM | POA: Diagnosis not present

## 2012-08-13 DIAGNOSIS — R3129 Other microscopic hematuria: Secondary | ICD-10-CM | POA: Diagnosis not present

## 2012-09-10 ENCOUNTER — Other Ambulatory Visit (HOSPITAL_COMMUNITY): Payer: Self-pay | Admitting: Cardiology

## 2012-09-13 ENCOUNTER — Ambulatory Visit (HOSPITAL_COMMUNITY)
Admission: RE | Admit: 2012-09-13 | Discharge: 2012-09-13 | Disposition: A | Discharge status: Home / Self Care | Payer: Medicare Other | Source: Ambulatory Visit | Attending: Cardiology | Admitting: Cardiology

## 2012-09-13 DIAGNOSIS — Z8673 Personal history of transient ischemic attack (TIA), and cerebral infarction without residual deficits: Secondary | ICD-10-CM | POA: Diagnosis not present

## 2012-09-13 DIAGNOSIS — R42 Dizziness and giddiness: Secondary | ICD-10-CM | POA: Diagnosis not present

## 2012-09-13 DIAGNOSIS — R0609 Other forms of dyspnea: Secondary | ICD-10-CM | POA: Diagnosis not present

## 2012-09-13 DIAGNOSIS — R6889 Other general symptoms and signs: Secondary | ICD-10-CM | POA: Insufficient documentation

## 2012-09-13 DIAGNOSIS — R5381 Other malaise: Secondary | ICD-10-CM | POA: Insufficient documentation

## 2012-09-13 DIAGNOSIS — R0989 Other specified symptoms and signs involving the circulatory and respiratory systems: Secondary | ICD-10-CM | POA: Insufficient documentation

## 2012-09-13 DIAGNOSIS — E785 Hyperlipidemia, unspecified: Secondary | ICD-10-CM | POA: Diagnosis not present

## 2012-09-13 DIAGNOSIS — R9431 Abnormal electrocardiogram [ECG] [EKG]: Secondary | ICD-10-CM | POA: Insufficient documentation

## 2012-09-13 DIAGNOSIS — I2510 Atherosclerotic heart disease of native coronary artery without angina pectoris: Secondary | ICD-10-CM | POA: Insufficient documentation

## 2012-09-13 MED ORDER — TECHNETIUM TC 99M SESTAMIBI GENERIC - CARDIOLITE
10.7000 | Freq: Once | INTRAVENOUS | Status: AC | PRN
  Administered 2012-09-13: 11 via INTRAVENOUS

## 2012-09-13 MED ORDER — REGADENOSON 0.4 MG/5ML IV SOLN
0.4000 mg | Freq: Once | INTRAVENOUS | Status: AC
  Administered 2012-09-13: 0.4 mg via INTRAVENOUS

## 2012-09-13 MED ORDER — TECHNETIUM TC 99M SESTAMIBI GENERIC - CARDIOLITE
30.3000 | Freq: Once | INTRAVENOUS | Status: AC | PRN
  Administered 2012-09-13: 30.3 via INTRAVENOUS

## 2012-09-13 NOTE — Procedures (Addendum)
[facility] [address] [place] [address] [place] [address] [phone]  Cardiology Nuclear Med Study  Joshua Chambers is a 79 y.o. male     MRN : [id number]     DOB: 11-02-1922  Procedure Date: 09/13/2012  Nuclear Med Background Indication for Stress Test:  Evaluation for Ischemia, Graft Patency and Abnormal EKG History:  CAD;CABG-X4-2007 Cardiac Risk Factors: Lipids and TIA  Symptoms:  Dizziness, DOE and Fatigue   Nuclear Pre-Procedure Caffeine/Decaff Intake:  1:00am NPO After: 11AM   IV Site: R Forearm  IV 0.9% NS with Angio Cath:  22g  Chest Size (in):  44" IV Started by: Emmit Pomfret, RN  Height: 6\' 3"  (1.905 m)  Cup Size: n/a  BMI:  Body mass index is 26.25 kg/(m^2). Weight:  210 lb (95.255 kg)   Tech Comments:  N/A    Nuclear Med Study 1 or 2 day study: 1 day  Stress Test Type:  Lexiscan  Order Authorizing Provider:  DAVID HARDING,MD   Resting Radionuclide: Technetium 52m Sestamibi  Resting Radionuclide Dose: 10.7 mCi   Stress Radionuclide:  Technetium 25m Sestamibi  Stress Radionuclide Dose: 30.3 mCi           Stress Protocol Rest HR: 79 Stress HR: 100  Rest BP:134/99 Stress BP: 142/104  Exercise Time (min): n/a METS: n/a          Dose of Adenosine (mg):  n/a Dose of Lexiscan: 0.4 mg  Dose of Atropine (mg): n/a Dose of Dobutamine: n/a mcg/kg/min (at max HR)  Stress Test Technologist: Ernestene Mention, CCT Nuclear Technologist: Gonzella Lex, CNMT   Rest Procedure:  Myocardial perfusion imaging was performed at rest 45 minutes following the intravenous administration of Technetium 54m Sestamibi. Stress Procedure:  The patient received IV Lexiscan 0.4 mg over 15-seconds.  Technetium 68m Sestamibi injected at 30-seconds.  There were no significant changes with Lexiscan.  Quantitative spect images were obtained after a 45 minute delay.  Transient Ischemic Dilatation (Normal <1.22):  0.93 Lung/Heart Ratio (Normal <0.45):   0.34 QGS EDV:  98 ml QGS ESV:  50 ml LV Ejection Fraction: 49%  Rest ECG: NSR with PAC's  Stress ECG: There are scattered PACs.  QPS Raw Data Images:  Mild diaphragmatic attenuation.  Normal left ventricular size. Stress Images:  Normal homogeneous uptake in all areas of the myocardium. Rest Images:  Decreased uptake in the basal septal wall from bowel artifact Subtraction (SDS):  No evidence of ischemia.  Impression Exercise Capacity:  Lexiscan with no exercise. BP Response:  Hypotensive blood pressure response. Clinical Symptoms:  Flushed ECG Impression:  No significant ECG changes with Lexiscan. Comparison with Prior Nuclear Study: No significant change from previous study in 2009.  Overall Impression:  Low risk stress nuclear study. Bowel artifact noted in the basal septal territory. No reversible ischemia.  LV Wall Motion:  Incoordinate septal motion, mildly reduced LVEF 49%.Chrystie Nose, MD, [facility] Board Certified in Nuclear Cardiology Attending Cardiologist [facility]  Chrystie Nose, MD  09/13/2012 4:45 PM

## 2012-09-20 DIAGNOSIS — Z951 Presence of aortocoronary bypass graft: Secondary | ICD-10-CM | POA: Diagnosis not present

## 2012-10-10 DIAGNOSIS — M67919 Unspecified disorder of synovium and tendon, unspecified shoulder: Secondary | ICD-10-CM | POA: Diagnosis not present

## 2012-10-10 DIAGNOSIS — R5381 Other malaise: Secondary | ICD-10-CM | POA: Diagnosis not present

## 2012-11-06 DIAGNOSIS — J209 Acute bronchitis, unspecified: Secondary | ICD-10-CM | POA: Diagnosis not present

## 2012-11-13 DIAGNOSIS — R05 Cough: Secondary | ICD-10-CM | POA: Diagnosis not present

## 2012-12-22 ENCOUNTER — Encounter (HOSPITAL_COMMUNITY): Payer: Self-pay | Admitting: Emergency Medicine

## 2012-12-22 ENCOUNTER — Emergency Department (HOSPITAL_COMMUNITY)
Admission: EM | Admit: 2012-12-22 | Discharge: 2012-12-22 | Disposition: A | Discharge status: Home / Self Care | Payer: Medicare Other | Attending: Emergency Medicine | Admitting: Emergency Medicine

## 2012-12-22 DIAGNOSIS — M129 Arthropathy, unspecified: Secondary | ICD-10-CM | POA: Diagnosis not present

## 2012-12-22 DIAGNOSIS — Z8673 Personal history of transient ischemic attack (TIA), and cerebral infarction without residual deficits: Secondary | ICD-10-CM | POA: Insufficient documentation

## 2012-12-22 DIAGNOSIS — H1130 Conjunctival hemorrhage, unspecified eye: Secondary | ICD-10-CM | POA: Insufficient documentation

## 2012-12-22 DIAGNOSIS — H1132 Conjunctival hemorrhage, left eye: Secondary | ICD-10-CM

## 2012-12-22 DIAGNOSIS — I2510 Atherosclerotic heart disease of native coronary artery without angina pectoris: Secondary | ICD-10-CM | POA: Diagnosis not present

## 2012-12-22 HISTORY — DX: Cerebral infarction, unspecified: I63.9

## 2012-12-22 HISTORY — DX: Unspecified osteoarthritis, unspecified site: M19.90

## 2012-12-22 MED ORDER — TETRACAINE HCL 0.5 % OP SOLN
1.0000 [drp] | Freq: Once | OPHTHALMIC | Status: AC
  Administered 2012-12-22: 1 [drp] via OPHTHALMIC
  Filled 2012-12-22: qty 2

## 2012-12-22 MED ORDER — FLUORESCEIN SODIUM 1 MG OP STRP
1.0000 | ORAL_STRIP | Freq: Once | OPHTHALMIC | Status: AC
  Administered 2012-12-22: 1 via OPHTHALMIC
  Filled 2012-12-22: qty 1

## 2012-12-22 MED ORDER — ARTIFICIAL TEARS OP OINT
TOPICAL_OINTMENT | OPHTHALMIC | Status: DC | PRN
  Filled 2012-12-22: qty 3.5

## 2012-12-22 NOTE — ED Notes (Signed)
Redness on left eye noted,pt. Denies of itchiness nor pain,denies blurry vision.

## 2012-12-22 NOTE — ED Notes (Signed)
MD at bedside. 

## 2012-12-22 NOTE — ED Notes (Signed)
Visual acuity screening done, left eye has result of 20/50 ad right eye is 20/30.PA notified.

## 2012-12-22 NOTE — ED Notes (Signed)
Pt awoke at 4am noted L eye red. Pt denies blurred vision or visual disturbances. Pt states no pain but does feel like "something is in eye". A & O, PWD

## 2012-12-22 NOTE — ED Provider Notes (Signed)
History     CSN: [id number]  Arrival date & time 12/22/12  4098   First MD Initiated Contact with Patient 12/22/12 0600      Chief Complaint  Patient presents with  . Eye Problem    (Consider location/radiation/quality/duration/timing/severity/associated sxs/prior treatment) HPI Comments: Patient presents to the ED for red left eye. 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 At that time he had a foreign body sensation which has resolved at this time. Pt awake around 4am and noticed that his eye was increasingly red but remained non-painful.  Patient denies any foreign body or trauma to the eye. No visual disturbance. Eye and EOMs are not painful.  No discharge from eye.  Pt has artificial lenses bilaterally- states "cheap" lens in left eye and his vision is worse in that eye at baseline.  Does not wear glasses.  Pt is established with opthalmologist, Dr. Jeanie Sewer.    The history is provided by the patient.    Past Medical History  Diagnosis Date  . Coronary artery disease   . Arthritis   . Stroke     Past Surgical History  Procedure Laterality Date  . Coronary artery bypass graft    . Appendectomy    . Tonsillectomy    . Colon surgery      No family history on file.  History  Substance Use Topics  . Smoking status: Never Smoker   . Smokeless tobacco: Not on file  . Alcohol Use: Yes     Comment: occ      Review of Systems  Eyes: Positive for redness.  All other systems reviewed and are negative.    Allergies  Diltiazem; Sulfa antibiotics; and Zocor  Home Medications  No current outpatient prescriptions on file.  BP 129/74  Pulse 86  Temp(Src) 97.7 F (36.5 C) (Oral)  Resp 18  Ht 6\' 3"  (1.905 m)  Wt 212 lb (96.163 kg)  BMI 26.5 kg/m2  Physical Exam  Nursing note and vitals reviewed. Constitutional: He is oriented to person, place, and time. He appears well-developed and  well-nourished. No distress.  HENT:  Head: Normocephalic and atraumatic.  Mouth/Throat: Oropharynx is clear and moist.  Eyes: EOM are normal. Pupils are equal, round, and reactive to light. Right eye exhibits no discharge. Left eye exhibits no discharge. Left conjunctiva has a hemorrhage. Right eye exhibits normal extraocular motion and no nystagmus. Left eye exhibits normal extraocular motion and no nystagmus.  Slit lamp exam:      The left eye shows no corneal abrasion, no corneal ulcer, no foreign body, no hypopyon, no fluorescein uptake and no anterior chamber bulge.    Left eye with subconjunctival hemorrhage  Neck: Normal range of motion. Neck supple.  Cardiovascular: Normal rate, regular rhythm and normal heart sounds.   Pulmonary/Chest: Effort normal and breath sounds normal. No respiratory distress.  Musculoskeletal: Normal range of motion.  Neurological: He is alert and oriented to person, place, and time.  Skin: Skin is warm and dry.  Psychiatric: He has a normal mood and affect.    ED Course  Procedures (including critical care time)  Labs Reviewed - No data to display No results found.   1. Subconjunctival hemorrhage of left eye       MDM   Visual acuity R 20/30, L 20/50- pt feels this is baseline.  No FB or fluorescein uptake of left eye to suggest corneal  abrasion or ulcer- likely subconjunctival hemorrhage.  Pt given lacrilube in the ED and instructed on home use.  FU with optho, Dr. Hazle Quant, if sx not improving in the next 3-4 days.  Discussed plan with pt, he agreed.  Return precautions advised.       Garlon Hatchet, PA-C 12/22/12 [phone]

## 2012-12-23 NOTE — ED Provider Notes (Signed)
Medical screening examination/treatment/procedure(s) were conducted as a shared visit with non-physician practitioner(s) and myself.  I personally evaluated the patient during the encounter.  Pt with nonpainful left red eye, no change in vision, has ophthalmologist.  Exam c/w sunconjunctival hemorrhage.  Olivia Mackie, MD 12/23/12 [phone]

## 2012-12-24 DIAGNOSIS — H1130 Conjunctival hemorrhage, unspecified eye: Secondary | ICD-10-CM | POA: Diagnosis not present

## 2013-01-15 DIAGNOSIS — N401 Enlarged prostate with lower urinary tract symptoms: Secondary | ICD-10-CM | POA: Diagnosis not present

## 2013-01-15 DIAGNOSIS — R310 Gross hematuria: Secondary | ICD-10-CM | POA: Diagnosis not present

## 2013-03-22 DIAGNOSIS — L723 Sebaceous cyst: Secondary | ICD-10-CM | POA: Diagnosis not present

## 2013-03-22 DIAGNOSIS — M7520 Bicipital tendinitis, unspecified shoulder: Secondary | ICD-10-CM | POA: Diagnosis not present

## 2013-04-17 ENCOUNTER — Encounter: Payer: Self-pay | Admitting: Cardiology

## 2013-04-17 ENCOUNTER — Encounter: Payer: Self-pay | Admitting: *Deleted

## 2013-04-17 ENCOUNTER — Ambulatory Visit (INDEPENDENT_AMBULATORY_CARE_PROVIDER_SITE_OTHER): Payer: Medicare Other | Admitting: Cardiology

## 2013-04-17 VITALS — BP 140/80 | HR 77 | Ht 74.0 in | Wt 211.8 lb

## 2013-04-17 DIAGNOSIS — Z01818 Encounter for other preprocedural examination: Secondary | ICD-10-CM | POA: Diagnosis not present

## 2013-04-17 DIAGNOSIS — R5381 Other malaise: Secondary | ICD-10-CM | POA: Diagnosis not present

## 2013-04-17 DIAGNOSIS — I209 Angina pectoris, unspecified: Secondary | ICD-10-CM | POA: Diagnosis not present

## 2013-04-17 DIAGNOSIS — Z951 Presence of aortocoronary bypass graft: Secondary | ICD-10-CM

## 2013-04-17 DIAGNOSIS — R5383 Other fatigue: Secondary | ICD-10-CM | POA: Diagnosis not present

## 2013-04-17 DIAGNOSIS — I25810 Atherosclerosis of coronary artery bypass graft(s) without angina pectoris: Secondary | ICD-10-CM

## 2013-04-17 DIAGNOSIS — D689 Coagulation defect, unspecified: Secondary | ICD-10-CM | POA: Insufficient documentation

## 2013-04-17 DIAGNOSIS — I2510 Atherosclerotic heart disease of native coronary artery without angina pectoris: Secondary | ICD-10-CM

## 2013-04-17 DIAGNOSIS — E785 Hyperlipidemia, unspecified: Secondary | ICD-10-CM | POA: Diagnosis not present

## 2013-04-17 HISTORY — DX: Encounter for other preprocedural examination: Z01.818

## 2013-04-17 HISTORY — DX: Other fatigue: R53.83

## 2013-04-17 HISTORY — DX: Atherosclerosis of coronary artery bypass graft(s) without angina pectoris: I25.810

## 2013-04-17 HISTORY — DX: Coagulation defect, unspecified: D68.9

## 2013-04-17 MED ORDER — NITROGLYCERIN 0.4 MG SL SUBL: 0.4000 mg | SUBLINGUAL_TABLET | SUBLINGUAL | Status: DC | PRN

## 2013-04-17 MED ORDER — ISOSORBIDE MONONITRATE ER 30 MG PO TB24: 30.0000 mg | ORAL_TABLET | Freq: Every day | ORAL | Status: DC

## 2013-04-17 MED ORDER — METOPROLOL TARTRATE 25 MG PO TABS: 12.5000 mg | ORAL_TABLET | Freq: Two times a day (BID) | ORAL | Status: DC

## 2013-04-17 NOTE — Assessment & Plan Note (Signed)
Currently on a statin.  Tolerating it well.  He will need followup labs checked.  The subcutis in the hospital, otherwise would see him back in followup post cath.

## 2013-04-17 NOTE — Assessment & Plan Note (Signed)
He is describing chest tightness and dyspnea with just to be walking across the lobby.  This would qualify for class III angina.  However based on how quickly this symptomatology is occurring, it is almost crescendo angina.  I am concerned that this could easily be related to graft disease.  LAD feeling is that we should answer the question definitively based on the severity of his symptoms and his overall concern. His wife is supposed to go for surgery this Monday, and he would hope to have this issue resolved before that occurs.  Plan:   Prescribed when necessary sublingual Nitroglycerin, Imdur 30 mm daily, metoprolol tartrate 25 mg twice a day.  We'll schedule for left heart catheterization with coronary and graft angiography via left radial approach this Friday morning.  He will have his pre-cath labs checked tomorrow.  He had a chest x-ray done in the hospital.

## 2013-04-17 NOTE — Assessment & Plan Note (Signed)
He has known coronary disease, he is describing similar symptoms he had prior to his CABG.  Plan: Initiate interventional therapy, and proceed directly to cardiac catheterization to avoid the long waiting in anxiety/concern.

## 2013-04-17 NOTE — Progress Notes (Signed)
  PCP: GATES,ROBERT NEVILL, MD  Clinic Note: Chief Complaint  Patient presents with  . Shortness of Breath    Sometimes at rest, c/o tiring easily. Some chest tightness, weakness in legs, lightheadedness-especially in the morning.    HPI: Joshua Chambers is a 79 y.o. male with a PMH below who presents today for urgent walk-in visit to evaluate breathlessness, weakness and fatigue. He is a very pleasant gentleman who I been following for a few years now.  He had a history of coronary disease with CABG in 2007.  Mostly what I been treating for is his orthostatic hypotension.  Interval History: He called in today to be seen as a walk-in visit due to breathlessness.  He describes as feeling the same his he did before his bypass surgery which was followed and it.  He says he feels just tired and fatigued.  Just walking around the office here makes him tired.  He notes dyspnea with walking.  And when he gets to sit down he feels a pressure and tightness in his chest.  He is now noted this simply walking across the office lobby.  A few days ago he was able to do much more than this.  His feels weak and lethargic.  He notes waking up short of breath with PND type symptoms, but denies orthopnea at baseline.  He does not have any edema.  One episode was associated with a very brief period of what sound like right hemianopsia.  That was quickly resolved.  He has noted some irregular heartbeats flip-flopping in his chest.  They haven't felt like as a fast heartbeat however.  The remainder of Cardiovascular ROS: negative for - edema, loss of consciousness, murmur, orthopnea or rapid heart rate: Additional cardiac review of systems: Lightheadedness - yes, dizziness - yes, syncope/near-syncope - near-syncope; TIA/amaurosis fugax - that one brief episode with mild hemianopsia, but otherwise no Melena - no, hematochezia no; hematuria - no; nosebleeds - no; claudication - no    Past Medical History  Diagnosis  Date  . CAD (coronary artery disease), native coronary artery 2007    Multivessel CAD referred for CABG  . S/P CABG x 3 2007    LIMA-LAD, as she OM, SVG-RCA  . History of nuclear stress test April 2009    6 min, 10 METS, diaphragmatic attenuation, no ischemia.  . Abnormal echocardiogram 2011    EF 45-50%, mild HK of anterior septum.  . History of stroke 2011    Possible TIA in 2001  . Hx of orthostatic hypotension 2009    Positive tilt table:  dizziness/vasopressor type; previously on the decision   . Dyslipidemia, goal LDL below 70   . Hx SBO   . History of DVT of lower extremity     Right popliteal vein  . Nephrolithiasis   . Arthritis     Prior Cardiac Evaluation and Past Surgical History: Past Surgical History  Procedure Laterality Date  . Coronary artery bypass graft  2007    LIMA-LAD, SVG-OM, SVG-RCA  . Appendectomy    . Tonsillectomy    . Colon surgery      Allergies  Allergen Reactions  . Amiodarone Other (See Comments)    Extreme dehydration  . Diltiazem Other (See Comments)    Pt unsure of reaction  . Sulfa Antibiotics Other (See Comments)    Pt unsure of reaction    Current Outpatient Prescriptions  Medication Sig Dispense Refill  . aspirin EC 81 MG tablet   Take 81 mg by mouth every evening.      . atorvastatin (LIPITOR) 40 MG tablet Take 40 mg by mouth every morning.      . ranitidine (ZANTAC) 150 MG tablet Take 150 mg by mouth daily.      . isosorbide mononitrate (IMDUR) 30 MG 24 hr tablet Take 1 tablet (30 mg total) by mouth daily.  30 tablet  6  . metoprolol tartrate (LOPRESSOR) 25 MG tablet Take 0.5 tablets (12.5 mg total) by mouth 2 (two) times daily.  30 tablet  6  . nitroGLYCERIN (NITROSTAT) 0.4 MG SL tablet Place 1 tablet (0.4 mg total) under the tongue every 5 (five) minutes as needed for chest pain.  25 tablet  3   No current facility-administered medications for this visit.    History   Social History Narrative    He is now remarried for  the last 2 years, his first wife of 60 years is deceased. He has 1   child from that first marriage and 2 grandchildren.    He does not smoke, does not drink.    He is not very active but he does kind of meander around with his new wife who is permanently disabled.    FAMILY HISTORY: I reviewed his family history in the paper chart, there is no pertinent history in this gentleman is 79 years old with known cardiac risk factors and coronary disease versus CABG.  ROS: A comprehensive Review of Systems - Negative except  pertinent positives noted above. General ROS: positive for  - fatigue, malaise and sleep disturbance No recent episodes of orthostatic hypotension  PHYSICAL EXAM BP 140/80  Pulse 77  Ht 6' 2" (1.88 m)  Wt 211 lb 12.8 oz (96.072 kg)  BMI 27.18 kg/m2 General appearance: alert, cooperative, appears stated age, mild distress and In general he is healthy-appearing. is well-dressed well-groomed.  He uses questions appropriately.  His is somewhat anxious and nervous appearing. Neck: no adenopathy, no carotid bruit, supple, symmetrical, trachea midline and I do not see need JVD Lungs: clear to auscultation bilaterally, normal percussion bilaterally and Mildly diminished breath sounds in the bases, but no wheezes rales or rhonchi. Heart: regular rate and rhythm, S1, S2 normal, no murmur, click, rub or gallop, normal apical impulse and Occasional ectopy Abdomen: soft, non-tender; bowel sounds normal; no masses,  no organomegaly Extremities: extremities normal, atraumatic, no cyanosis or edema, no edema, redness or tenderness in the calves or thighs and no ulcers, gangrene or trophic changes Pulses: 2+ and symmetric Skin: Skin color, texture, turgor normal. No rashes or lesions Neurologic: Alert and oriented X 3, normal strength and tone. Normal symmetric reflexes. Normal coordination and gait is somewhat unsteady, as he uses a cane for support.  CN II through XII grossly  intact  EKG:Performed today: Yes Rate: 77 , Rhythm: Sinus rhythm with PACs.  Otherwise essentially normal ECG with no ischemic changes besides nonspecific ST-T changes  Recent Labs: None available currently.  ASSESSMENT / PLAN: Angina, class III He is describing chest tightness and dyspnea with just to be walking across the lobby.  This would qualify for class III angina.  However based on how quickly this symptomatology is occurring, it is almost crescendo angina.  I am concerned that this could easily be related to graft disease.  LAD feeling is that we should answer the question definitively based on the severity of his symptoms and his overall concern. His wife is supposed to go for surgery this   Monday, and he would hope to have this issue resolved before that occurs.  Plan:   Prescribed when necessary sublingual Nitroglycerin, Imdur 30 mm daily, metoprolol tartrate 25 mg twice a day.  We'll schedule for left heart catheterization with coronary and graft angiography via left radial approach this Friday morning.  He will have his pre-cath labs checked tomorrow.  He had a chest x-ray done in the hospital.   CAD (coronary artery disease) He has known coronary disease, he is describing similar symptoms he had prior to his CABG.  Plan: Initiate interventional therapy, and proceed directly to cardiac catheterization to avoid the long waiting in anxiety/concern.  CAD (coronary artery disease) of artery bypass graft Since he does have 3 grafts, 2 of them are SVGs, we discussed the possibility of SVG lesions in the increased risk of SVG PCI.    S/P CABG x 3 As he does have bypass grafts, allowed to go from either femoral or left radial approach.  He would prefer TIG or left radial, therefore will proceed with distraction.  Preoperative examination, unspecified Labs for cardiac catheterization done tomorrow.  Performing MD:  Beva Remund W, M.D., M.S.  Procedure: Left Heart  Catheterization with Native and Graft Angiography, Possible Percutaneous Coronary Intervention (Balloon and Stent)  The procedure with Risks/Benefits/Alternatives and Indications was reviewed with the patient.  All questions were answered.    Risks / Complications include, but not limited to: Death, MI, CVA/TIA, VF/VT (with defibrillation), Bradycardia (need for temporary pacer placement), contrast induced nephropathy, bleeding / bruising / hematoma / pseudoaneurysm, vascular or coronary injury (with possible emergent CT or Vascular Surgery), adverse medication reactions, infection.  I did The increased risks of a PCI on the rest of the patient.  The patient voiced understanding and agrees to proceed.     Other malaise and fatigue A matching that is malaise and fatigue may very well be from some heart failure type symptoms.  He does have PND as well as dyspnea on exertion.  He has no other real hard for her symptoms of edema or orthopnea.  His baseline he is somewhat reduced, I'm concerned that he could have a worsening ejection fraction.  We can determine this either in the Cath Lab via left ventriculography, however if there is concern for renal function, I would recommend echocardiography as opposed to left angiography.  Dyslipidemia, goal LDL below 70 Currently on a statin.  Tolerating it well.  He will need followup labs checked.  The subcutis in the hospital, otherwise would see him back in followup post cath.     Orders Placed This Encounter  Procedures  . DG Chest 2 View    Standing Status: Future     Number of Occurrences:      Standing Expiration Date: 06/17/2014    Order Specific Question:  Reason for Exam (SYMPTOM  OR DIAGNOSIS REQUIRED)    Answer:  Fatigue, preop for cardiac cath, chest tightness    Order Specific Question:  Preferred imaging location?    Answer:  [facility]  . Basic metabolic panel  . CBC  . Protime-INR  . APTT  . TSH  . EKG 12-Lead  .  LEFT HEART CATHETERIZATION WITH CORONARY/GRAFT ANGIOGRAM   Meds ordered this encounter  Medications  . ranitidine (ZANTAC) 150 MG tablet    Sig: Take 150 mg by mouth daily.  . nitroGLYCERIN (NITROSTAT) 0.4 MG SL tablet    Sig: Place 1 tablet (0.4 mg total) under the tongue every 5 (  five) minutes as needed for chest pain.    Dispense:  25 tablet    Refill:  3  . isosorbide mononitrate (IMDUR) 30 MG 24 hr tablet    Sig: Take 1 tablet (30 mg total) by mouth daily.    Dispense:  30 tablet    Refill:  6  . metoprolol tartrate (LOPRESSOR) 25 MG tablet    Sig: Take 0.5 tablets (12.5 mg total) by mouth 2 (two) times daily.    Dispense:  30 tablet    Refill:  6    Followup: Roughly 2-3 weeks.  This will be post cath  Milisa Kimbell W. Mylissa Lambe, M.D., M.S. [facility] [address] [place], [place]  [address]  [phone] Pager # [phone]     

## 2013-04-17 NOTE — Assessment & Plan Note (Signed)
As he does have bypass grafts, allowed to go from either femoral or left radial approach.  He would prefer TIG or left radial, therefore will proceed with distraction.

## 2013-04-17 NOTE — Patient Instructions (Addendum)
Your physician has requested that you have a cardiac catheterization. Cardiac catheterization is used to diagnose and/or treat various heart conditions. Doctors may recommend this procedure for a number of different reasons. The most common reason is to evaluate chest pain. Chest pain can be a symptom of coronary artery disease (CAD), and cardiac catheterization can show whether plaque is narrowing or blocking your heart's arteries. This procedure is also used to evaluate the valves, as well as measure the blood flow and oxygen levels in different parts of your heart. For further information please visit [email or web address] Please follow instruction sheet, as given.  Schedule cath for Friday, 04/19/13.  Please have labs and chest x-ray done tomorrow, 04/18/13.   Start these medications: Isosorbide Mononitrate (Imdur) 30mg , take 1 tablet daily. Metoprolol Tartrate (Lopressor) 12.5mg , take 1/2 tablet twice daily.

## 2013-04-17 NOTE — Assessment & Plan Note (Signed)
Since he does have 3 grafts, 2 of them are SVGs, we discussed the possibility of SVG lesions in the increased risk of SVG PCI.

## 2013-04-17 NOTE — Assessment & Plan Note (Signed)
Labs for cardiac catheterization done tomorrow.  Performing MD:  [name], M.D., M.S.  Procedure: Left Heart Catheterization with Native and Graft Angiography, Possible Percutaneous Coronary Intervention (Balloon and Stent)  The procedure with Risks/Benefits/Alternatives and Indications was reviewed with the patient.  All questions were answered.    Risks / Complications include, but not limited to: Death, MI, CVA/TIA, VF/VT (with defibrillation), Bradycardia (need for temporary pacer placement), contrast induced nephropathy, bleeding / bruising / hematoma / pseudoaneurysm, vascular or coronary injury (with possible emergent CT or Vascular Surgery), adverse medication reactions, infection.  I did The increased risks of a PCI on the rest of the patient.  The patient voiced understanding and agrees to proceed.

## 2013-04-17 NOTE — Assessment & Plan Note (Signed)
A matching that is malaise and fatigue may very well be from some heart failure type symptoms.  He does have PND as well as dyspnea on exertion.  He has no other real hard for her symptoms of edema or orthopnea.  His baseline he is somewhat reduced, I'm concerned that he could have a worsening ejection fraction.  We can determine this either in the Cath Lab via left ventriculography, however if there is concern for renal function, I would recommend echocardiography as opposed to left angiography.

## 2013-04-18 ENCOUNTER — Ambulatory Visit
Admission: RE | Admit: 2013-04-18 | Discharge: 2013-04-18 | Disposition: A | Discharge status: Home / Self Care | Payer: Medicare Other | Source: Ambulatory Visit | Attending: Cardiology | Admitting: Cardiology

## 2013-04-18 ENCOUNTER — Other Ambulatory Visit: Payer: Self-pay | Admitting: *Deleted

## 2013-04-18 ENCOUNTER — Encounter (HOSPITAL_COMMUNITY): Payer: Self-pay | Admitting: Pharmacy Technician

## 2013-04-18 DIAGNOSIS — Z01812 Encounter for preprocedural laboratory examination: Secondary | ICD-10-CM

## 2013-04-18 DIAGNOSIS — Z01818 Encounter for other preprocedural examination: Secondary | ICD-10-CM

## 2013-04-18 DIAGNOSIS — I209 Angina pectoris, unspecified: Secondary | ICD-10-CM

## 2013-04-18 DIAGNOSIS — R5381 Other malaise: Secondary | ICD-10-CM

## 2013-04-18 DIAGNOSIS — I2510 Atherosclerotic heart disease of native coronary artery without angina pectoris: Secondary | ICD-10-CM

## 2013-04-18 DIAGNOSIS — I25810 Atherosclerosis of coronary artery bypass graft(s) without angina pectoris: Secondary | ICD-10-CM

## 2013-04-18 DIAGNOSIS — D689 Coagulation defect, unspecified: Secondary | ICD-10-CM

## 2013-04-18 DIAGNOSIS — Z951 Presence of aortocoronary bypass graft: Secondary | ICD-10-CM

## 2013-04-18 DIAGNOSIS — J438 Other emphysema: Secondary | ICD-10-CM | POA: Diagnosis not present

## 2013-04-18 LAB — CBC
MCHC: 33.8 g/dL (ref 30.0–36.0)
MCV: 92.9 fL (ref 78.0–100.0)
Platelets: 306 10*3/uL (ref 150–400)
RDW: 13.3 % (ref 11.5–15.5)
WBC: 7.5 10*3/uL (ref 4.0–10.5)

## 2013-04-18 LAB — BASIC METABOLIC PANEL
BUN: 26 mg/dL — ABNORMAL HIGH (ref 6–23)
CO2: 26 mEq/L (ref 19–32)
Glucose, Bld: 101 mg/dL — ABNORMAL HIGH (ref 70–99)
Potassium: 4.2 mEq/L (ref 3.5–5.3)

## 2013-04-18 LAB — APTT: aPTT: 32 seconds (ref 24–37)

## 2013-04-19 ENCOUNTER — Encounter (HOSPITAL_COMMUNITY)
Admission: RE | Disposition: A | Discharge status: Home / Self Care | Payer: Self-pay | Source: Ambulatory Visit | Attending: Cardiology

## 2013-04-19 ENCOUNTER — Encounter (HOSPITAL_COMMUNITY): Payer: Self-pay | Admitting: Cardiology

## 2013-04-19 ENCOUNTER — Ambulatory Visit (HOSPITAL_COMMUNITY)
Admission: RE | Admit: 2013-04-19 | Discharge: 2013-04-21 | Disposition: A | Discharge status: Home / Self Care | Payer: Medicare Other | Source: Ambulatory Visit | Attending: Cardiology | Admitting: Cardiology

## 2013-04-19 DIAGNOSIS — E785 Hyperlipidemia, unspecified: Secondary | ICD-10-CM

## 2013-04-19 DIAGNOSIS — I25810 Atherosclerosis of coronary artery bypass graft(s) without angina pectoris: Secondary | ICD-10-CM | POA: Diagnosis not present

## 2013-04-19 DIAGNOSIS — I2510 Atherosclerotic heart disease of native coronary artery without angina pectoris: Secondary | ICD-10-CM

## 2013-04-19 DIAGNOSIS — I951 Orthostatic hypotension: Secondary | ICD-10-CM | POA: Diagnosis not present

## 2013-04-19 DIAGNOSIS — Z79899 Other long term (current) drug therapy: Secondary | ICD-10-CM | POA: Insufficient documentation

## 2013-04-19 DIAGNOSIS — Z951 Presence of aortocoronary bypass graft: Secondary | ICD-10-CM

## 2013-04-19 DIAGNOSIS — Z01812 Encounter for preprocedural laboratory examination: Secondary | ICD-10-CM

## 2013-04-19 DIAGNOSIS — I209 Angina pectoris, unspecified: Secondary | ICD-10-CM

## 2013-04-19 DIAGNOSIS — R5381 Other malaise: Secondary | ICD-10-CM | POA: Diagnosis not present

## 2013-04-19 DIAGNOSIS — R5383 Other fatigue: Secondary | ICD-10-CM

## 2013-04-19 HISTORY — DX: Atherosclerotic heart disease of native coronary artery without angina pectoris: I25.10

## 2013-04-19 HISTORY — PX: LEFT HEART CATHETERIZATION WITH CORONARY/GRAFT ANGIOGRAM: SHX5450

## 2013-04-19 HISTORY — PX: CORONARY ANGIOPLASTY WITH STENT PLACEMENT: SHX49

## 2013-04-19 HISTORY — DX: Atherosclerosis of coronary artery bypass graft(s) without angina pectoris: I25.810

## 2013-04-19 SURGERY — LEFT HEART CATHETERIZATION WITH CORONARY/GRAFT ANGIOGRAM
Anesthesia: LOCAL | Laterality: Left

## 2013-04-19 MED ORDER — HEPARIN (PORCINE) IN NACL 2-0.9 UNIT/ML-% IJ SOLN
INTRAMUSCULAR | Status: AC
  Filled 2013-04-19: qty 1000

## 2013-04-19 MED ORDER — MORPHINE SULFATE 2 MG/ML IJ SOLN
INTRAMUSCULAR | Status: AC
  Filled 2013-04-19: qty 1

## 2013-04-19 MED ORDER — ONDANSETRON HCL 4 MG/2ML IJ SOLN
4.0000 mg | Freq: Four times a day (QID) | INTRAMUSCULAR | Status: DC | PRN
  Administered 2013-04-20 – 2013-04-21 (×3): 4 mg via INTRAVENOUS
  Filled 2013-04-19 (×3): qty 2

## 2013-04-19 MED ORDER — HEPARIN SODIUM (PORCINE) 1000 UNIT/ML IJ SOLN
INTRAMUSCULAR | Status: AC
  Filled 2013-04-19: qty 1

## 2013-04-19 MED ORDER — SODIUM CHLORIDE 0.9 % IV SOLN: INTRAVENOUS | Status: AC

## 2013-04-19 MED ORDER — ISOSORBIDE MONONITRATE ER 30 MG PO TB24
30.0000 mg | ORAL_TABLET | Freq: Every day | ORAL | Status: DC
  Filled 2013-04-19: qty 1

## 2013-04-19 MED ORDER — SODIUM CHLORIDE 0.9 % IV SOLN: 250.0000 mL | INTRAVENOUS | Status: DC | PRN

## 2013-04-19 MED ORDER — VERAPAMIL HCL 2.5 MG/ML IV SOLN
INTRAVENOUS | Status: AC
  Filled 2013-04-19: qty 2

## 2013-04-19 MED ORDER — SODIUM CHLORIDE 0.9 % IJ SOLN: 3.0000 mL | Freq: Two times a day (BID) | INTRAMUSCULAR | Status: DC

## 2013-04-19 MED ORDER — ACETAMINOPHEN 325 MG PO TABS
650.0000 mg | ORAL_TABLET | ORAL | Status: DC | PRN
  Administered 2013-04-19 – 2013-04-20 (×2): 650 mg via ORAL
  Filled 2013-04-19 (×2): qty 2

## 2013-04-19 MED ORDER — BIVALIRUDIN 250 MG IV SOLR
INTRAVENOUS | Status: AC
  Filled 2013-04-19: qty 250

## 2013-04-19 MED ORDER — MORPHINE SULFATE 2 MG/ML IJ SOLN: 2.0000 mg | INTRAMUSCULAR | Status: DC | PRN

## 2013-04-19 MED ORDER — TICAGRELOR 90 MG PO TABS
90.0000 mg | ORAL_TABLET | Freq: Two times a day (BID) | ORAL | Status: DC
  Administered 2013-04-19 – 2013-04-21 (×4): 90 mg via ORAL
  Filled 2013-04-19 (×6): qty 1

## 2013-04-19 MED ORDER — TICAGRELOR 90 MG PO TABS
ORAL_TABLET | ORAL | Status: AC
  Filled 2013-04-19: qty 2

## 2013-04-19 MED ORDER — MORPHINE SULFATE 2 MG/ML IJ SOLN
2.0000 mg | Freq: Once | INTRAMUSCULAR | Status: AC
  Administered 2013-04-19: 16:00:00 2 mg via INTRAVENOUS

## 2013-04-19 MED ORDER — ATORVASTATIN CALCIUM 40 MG PO TABS
40.0000 mg | ORAL_TABLET | Freq: Every day | ORAL | Status: DC
  Administered 2013-04-19 – 2013-04-20 (×2): 40 mg via ORAL
  Filled 2013-04-19 (×4): qty 1

## 2013-04-19 MED ORDER — NITROGLYCERIN 0.4 MG SL SUBL: 0.4000 mg | SUBLINGUAL_TABLET | SUBLINGUAL | Status: DC | PRN

## 2013-04-19 MED ORDER — DUTASTERIDE 0.5 MG PO CAPS
0.5000 mg | ORAL_CAPSULE | Freq: Every day | ORAL | Status: DC
  Administered 2013-04-19 – 2013-04-20 (×2): 0.5 mg via ORAL
  Filled 2013-04-19 (×4): qty 1

## 2013-04-19 MED ORDER — ASPIRIN 81 MG PO CHEW
81.0000 mg | CHEWABLE_TABLET | Freq: Every day | ORAL | Status: DC
  Administered 2013-04-20 – 2013-04-21 (×2): 81 mg via ORAL
  Filled 2013-04-19 (×2): qty 1

## 2013-04-19 MED ORDER — NITROGLYCERIN 0.2 MG/ML ON CALL CATH LAB
INTRAVENOUS | Status: AC
  Filled 2013-04-19: qty 1

## 2013-04-19 MED ORDER — FAMOTIDINE 10 MG PO TABS
10.0000 mg | ORAL_TABLET | Freq: Every day | ORAL | Status: DC
  Administered 2013-04-19 – 2013-04-21 (×3): 10 mg via ORAL
  Filled 2013-04-19 (×3): qty 1

## 2013-04-19 MED ORDER — SODIUM CHLORIDE 0.9 % IV SOLN: 1.0000 mL/kg/h | INTRAVENOUS | Status: DC

## 2013-04-19 MED ORDER — SODIUM CHLORIDE 0.9 % IJ SOLN: 3.0000 mL | INTRAMUSCULAR | Status: DC | PRN

## 2013-04-19 MED ORDER — MIDAZOLAM HCL 2 MG/2ML IJ SOLN
INTRAMUSCULAR | Status: AC
  Filled 2013-04-19: qty 2

## 2013-04-19 MED ORDER — SODIUM CHLORIDE 0.9 % IV SOLN
1.7500 mg/kg/h | INTRAVENOUS | Status: DC
  Filled 2013-04-19: qty 250

## 2013-04-19 MED ORDER — METOPROLOL TARTRATE 12.5 MG HALF TABLET
12.5000 mg | ORAL_TABLET | Freq: Two times a day (BID) | ORAL | Status: DC
  Administered 2013-04-19: 23:00:00 12.5 mg via ORAL
  Filled 2013-04-19 (×3): qty 1

## 2013-04-19 MED ORDER — LIDOCAINE HCL (PF) 1 % IJ SOLN
INTRAMUSCULAR | Status: AC
  Filled 2013-04-19: qty 30

## 2013-04-19 MED ORDER — SODIUM CHLORIDE 0.9 % IJ SOLN
3.0000 mL | Freq: Two times a day (BID) | INTRAMUSCULAR | Status: DC
  Administered 2013-04-20 (×2): 3 mL via INTRAVENOUS

## 2013-04-19 MED ORDER — FENTANYL CITRATE 0.05 MG/ML IJ SOLN
INTRAMUSCULAR | Status: AC
  Filled 2013-04-19: qty 2

## 2013-04-19 MED ORDER — SODIUM CHLORIDE 0.9 % IV SOLN
INTRAVENOUS | Status: DC
  Administered 2013-04-19: 08:00:00 via INTRAVENOUS

## 2013-04-19 NOTE — Interval H&P Note (Signed)
History and Physical Interval Note:  04/19/2013 8:07 AM  Joshua Chambers  has presented today for surgery, with the diagnosis of CLASS III ANGINA.   The various methods of treatment have been discussed with the patient and family. After consideration of risks, benefits and other options for treatment, the patient has consented to   Procedure(s): LEFT HEART CATHETERIZATION WITH CORONARY/GRAFT ANGIOGRAM (Left) as a surgical intervention .    Cath Lab Visit (complete for each Cath Lab visit)  Clinical Evaluation Leading to the Procedure:   ACS: no  Non-ACS:    Anginal Classification: CCS III  Anti-ischemic medical therapy: Minimal Therapy (1 class of medications)  Non-Invasive Test Results: No non-invasive testing performed  Prior CABG: Previous CABG  The patient's history has been reviewed, patient examined, no change in status, stable for surgery.  I have reviewed the patient's chart and labs.  Questions were answered to the patient's satisfaction.     Toni Hoffmeister W

## 2013-04-19 NOTE — H&P (View-Only) (Signed)
PCP: Pearla Dubonnet, MD  Clinic Note: Chief Complaint  Patient presents with  . Shortness of Breath    Sometimes at rest, c/o tiring easily. Some chest tightness, weakness in legs, lightheadedness-especially in the morning.    HPI: Joshua Chambers is a 79 y.o. male with a PMH below who presents today for urgent walk-in visit to evaluate breathlessness, weakness and fatigue. He is a very pleasant gentleman who I been following for a few years now.  He had a history of coronary disease with CABG in 2007.  Mostly what I been treating for is his orthostatic hypotension.  Interval History: He called in today to be seen as a walk-in visit due to breathlessness.  He describes as feeling the same his he did before his bypass surgery which was followed and it.  He says he feels just tired and fatigued.  Just walking around the office here makes him tired.  He notes dyspnea with walking.  And when he gets to sit down he feels a pressure and tightness in his chest.  He is now noted this simply walking across the office lobby.  A few days ago he was able to do much more than this.  His feels weak and lethargic.  He notes waking up short of breath with PND type symptoms, but denies orthopnea at baseline.  He does not have any edema.  One episode was associated with a very brief period of what sound like right hemianopsia.  That was quickly resolved.  He has noted some irregular heartbeats flip-flopping in his chest.  They haven't felt like as a fast heartbeat however.  The remainder of Cardiovascular ROS: negative for - edema, loss of consciousness, murmur, orthopnea or rapid heart rate: Additional cardiac review of systems: Lightheadedness - yes, dizziness - yes, syncope/near-syncope - near-syncope; TIA/amaurosis fugax - that one brief episode with mild hemianopsia, but otherwise no Melena - no, hematochezia no; hematuria - no; nosebleeds - no; claudication - no    Past Medical History  Diagnosis  Date  . CAD (coronary artery disease), native coronary artery 2007    Multivessel CAD referred for CABG  . S/P CABG x 3 2007    LIMA-LAD, as she OM, SVG-RCA  . History of nuclear stress test April 2009    6 min, 10 METS, diaphragmatic attenuation, no ischemia.  . Abnormal echocardiogram 2011    EF 45-50%, mild HK of anterior septum.  [name] History of stroke 2011    Possible TIA in 2001  . Hx of orthostatic hypotension 2009    Positive tilt table:  dizziness/vasopressor type; previously on the decision   . Dyslipidemia, goal LDL below 70   . Hx SBO   . History of DVT of lower extremity     Right popliteal vein  . Nephrolithiasis   . Arthritis     Prior Cardiac Evaluation and Past Surgical History: Past Surgical History  Procedure Laterality Date  . Coronary artery bypass graft  2007    LIMA-LAD, SVG-OM, SVG-RCA  . Appendectomy    . Tonsillectomy    . Colon surgery      Allergies  Allergen Reactions  . Amiodarone Other (See Comments)    Extreme dehydration  . Diltiazem Other (See Comments)    Pt unsure of reaction  . Sulfa Antibiotics Other (See Comments)    Pt unsure of reaction    Current Outpatient Prescriptions  Medication Sig Dispense Refill  . aspirin EC 81 MG tablet  Take 81 mg by mouth every evening.      [name] atorvastatin (LIPITOR) 40 MG tablet Take 40 mg by mouth every morning.      . ranitidine (ZANTAC) 150 MG tablet Take 150 mg by mouth daily.      . isosorbide mononitrate (IMDUR) 30 MG 24 hr tablet Take 1 tablet (30 mg total) by mouth daily.  30 tablet  6  . metoprolol tartrate (LOPRESSOR) 25 MG tablet Take 0.5 tablets (12.5 mg total) by mouth 2 (two) times daily.  30 tablet  6  . nitroGLYCERIN (NITROSTAT) 0.4 MG SL tablet Place 1 tablet (0.4 mg total) under the tongue every 5 (five) minutes as needed for chest pain.  25 tablet  3   No current facility-administered medications for this visit.    History   Social History Narrative    He is now remarried for  the last 2 years, his first wife of 60 years is deceased. He has 1   child from that first marriage and 2 grandchildren.    He does not smoke, does not drink.    He is not very active but he does kind of meander around with his new wife who is permanently disabled.    FAMILY HISTORY: I reviewed his family history in the paper chart, there is no pertinent history in this gentleman is 79 years old with known cardiac risk factors and coronary disease versus CABG.  ROS: A comprehensive Review of Systems - Negative except  pertinent positives noted above. General ROS: positive for  - fatigue, malaise and sleep disturbance No recent episodes of orthostatic hypotension  PHYSICAL EXAM BP 140/80  Pulse 77  Ht 6\' 2"  (1.88 m)  Wt 211 lb 12.8 oz (96.072 kg)  BMI 27.18 kg/m2 General appearance: alert, cooperative, appears stated age, mild distress and In general he is healthy-appearing. is well-dressed well-groomed.  He uses questions appropriately.  His is somewhat anxious and nervous appearing. Neck: no adenopathy, no carotid bruit, supple, symmetrical, trachea midline and I do not see need JVD Lungs: clear to auscultation bilaterally, normal percussion bilaterally and Mildly diminished breath sounds in the bases, but no wheezes rales or rhonchi. Heart: regular rate and rhythm, S1, S2 normal, no murmur, click, rub or gallop, normal apical impulse and Occasional ectopy Abdomen: soft, non-tender; bowel sounds normal; no masses,  no organomegaly Extremities: extremities normal, atraumatic, no cyanosis or edema, no edema, redness or tenderness in the calves or thighs and no ulcers, gangrene or trophic changes Pulses: 2+ and symmetric Skin: Skin color, texture, turgor normal. No rashes or lesions Neurologic: Alert and oriented X 3, normal strength and tone. Normal symmetric reflexes. Normal coordination and gait is somewhat unsteady, as he uses a cane for support.  CN II through XII grossly  intact  UVO:[id number] today: Yes Rate: 77 , Rhythm: Sinus rhythm with PACs.  Otherwise essentially normal ECG with no ischemic changes besides nonspecific ST-T changes  Recent Labs: None available currently.  ASSESSMENT / PLAN: Angina, class III He is describing chest tightness and dyspnea with just to be walking across the lobby.  This would qualify for class III angina.  However based on how quickly this symptomatology is occurring, it is almost crescendo angina.  I am concerned that this could easily be related to graft disease.  LAD feeling is that we should answer the question definitively based on the severity of his symptoms and his overall concern. His wife is supposed to go for surgery this  Monday, and he would hope to have this issue resolved before that occurs.  Plan:   Prescribed when necessary sublingual Nitroglycerin, Imdur 30 mm daily, metoprolol tartrate 25 mg twice a day.  We'll schedule for left heart catheterization with coronary and graft angiography via left radial approach this Friday morning.  He will have his pre-cath labs checked tomorrow.  He had a chest x-ray done in the hospital.   CAD (coronary artery disease) He has known coronary disease, he is describing similar symptoms he had prior to his CABG.  Plan: Initiate interventional therapy, and proceed directly to cardiac catheterization to avoid the long waiting in anxiety/concern.  CAD (coronary artery disease) of artery bypass graft Since he does have 3 grafts, 2 of them are SVGs, we discussed the possibility of SVG lesions in the increased risk of SVG PCI.    S/P CABG x 3 As he does have bypass grafts, allowed to go from either femoral or left radial approach.  He would prefer TIG or left radial, therefore will proceed with distraction.  Preoperative examination, unspecified Labs for cardiac catheterization done tomorrow.  Performing MD:  [name], M.D., M.S.  Procedure: Left Heart  Catheterization with Native and Graft Angiography, Possible Percutaneous Coronary Intervention (Balloon and Stent)  The procedure with Risks/Benefits/Alternatives and Indications was reviewed with the patient.  All questions were answered.    Risks / Complications include, but not limited to: Death, MI, CVA/TIA, VF/VT (with defibrillation), Bradycardia (need for temporary pacer placement), contrast induced nephropathy, bleeding / bruising / hematoma / pseudoaneurysm, vascular or coronary injury (with possible emergent CT or Vascular Surgery), adverse medication reactions, infection.  I did The increased risks of a PCI on the rest of the patient.  The patient voiced understanding and agrees to proceed.     Other malaise and fatigue A matching that is malaise and fatigue may very well be from some heart failure type symptoms.  He does have PND as well as dyspnea on exertion.  He has no other real hard for her symptoms of edema or orthopnea.  His baseline he is somewhat reduced, I'm concerned that he could have a worsening ejection fraction.  We can determine this either in the Cath Lab via left ventriculography, however if there is concern for renal function, I would recommend echocardiography as opposed to left angiography.  Dyslipidemia, goal LDL below 70 Currently on a statin.  Tolerating it well.  He will need followup labs checked.  The subcutis in the hospital, otherwise would see him back in followup post cath.     Orders Placed This Encounter  Procedures  . DG Chest 2 View    Standing Status: Future     Number of Occurrences:      Standing Expiration Date: 06/17/2014    Order Specific Question:  Reason for Exam (SYMPTOM  OR DIAGNOSIS REQUIRED)    Answer:  Fatigue, preop for cardiac cath, chest tightness    Order Specific Question:  Preferred imaging location?    Answer:  [facility]  . Basic metabolic panel  . CBC  . Protime-INR  . APTT  . TSH  . EKG 12-Lead  .  LEFT HEART CATHETERIZATION WITH CORONARY/GRAFT ANGIOGRAM   Meds ordered this encounter  Medications  . ranitidine (ZANTAC) 150 MG tablet    Sig: Take 150 mg by mouth daily.  . nitroGLYCERIN (NITROSTAT) 0.4 MG SL tablet    Sig: Place 1 tablet (0.4 mg total) under the tongue every 5 (  five) minutes as needed for chest pain.    Dispense:  25 tablet    Refill:  3  . isosorbide mononitrate (IMDUR) 30 MG 24 hr tablet    Sig: Take 1 tablet (30 mg total) by mouth daily.    Dispense:  30 tablet    Refill:  6  . metoprolol tartrate (LOPRESSOR) 25 MG tablet    Sig: Take 0.5 tablets (12.5 mg total) by mouth 2 (two) times daily.    Dispense:  30 tablet    Refill:  6    Followup: Roughly 2-3 weeks.  This will be post cath  DAVID W. Herbie Baltimore, M.D., M.S. [facility] [address] [place], [place]  [address]  [phone] Pager # [phone]

## 2013-04-19 NOTE — Progress Notes (Signed)
TR BAND REMOVAL  LOCATION:  left radial  DEFLATED PER PROTOCOL:  yes  TIME BAND OFF / DRESSING APPLIED:1545  SITE UPON ARRIVAL:   Level 0  SITE AFTER BAND REMOVAL:  Level 0  REVERSE ALLEN'S TEST:    positive  CIRCULATION SENSATION AND MOVEMENT:  Within Normal Limits  yes  COMMENTS:  Initial deflation was started in holding area and site bleed air was reinserted then deflation time restarted no further complication with the site.

## 2013-04-19 NOTE — CV Procedure (Signed)
CARDIAC CATHETERIZATION AND PERCUTANEOUS CORONARY INTERVENTION REPORT  NAME:  Joshua Chambers   MRN: [id number] DOB:  Sep 01, 1922   ADMIT DATE: 04/19/2013 Procedure Date: 04/19/2013  INTERVENTIONAL CARDIOLOGIST: [name], M.D., MS PRIMARY CARE PROVIDER: Pearla Dubonnet, MD PRIMARY CARDIOLOGIST: [name], MD, MS  PATIENT:  Joshua Chambers is a 79 y.o. male with the problem list below: Active Problems:   Angina, class III   S/P CABG x 4; LIMA-LAD, SVG-RPDA, SVG-OM, SVG-D1   CAD (coronary artery disease)   Other malaise and fatigue   Dyslipidemia, goal LDL below 70  He was seen on 10/1 for an urgent walk-in visit with ~2-3 weeks of worsening breathlessness, weakness & fatigue.  He notes worsening exertional dyspnea followed by SS Chest pressure/ tightness c/w his symptoms pre-CABG.  Just walking around the office here makes him tired. He notes dyspnea with walking. And when he gets to sit down he feels a pressure and tightness in his chest. He is now noted this simply walking across the office lobby. A few days ago he was able to do much more than this. His feels weak and lethargic. He notes waking up short of breath with PND type symptoms, but denies orthopnea at baseline. He does not have any edema. Given the progressive nature of his symptoms, both intensity & level of activity from Class I to now at least Class III (if not III, Crescendo ANGINA)  PRE-OPERATIVE DIAGNOSIS:    CLASS III ANGINA  CRESCENDO EXERTIONAL ANGINA  PROCEDURES PERFORMED:    LEFT HEART CATHETERIZATION WITH NATIVE AND GRAFT ANGIOGRAPHY  PERCUTANEOUS CORONARY INTERVENTION ON THE NATIVE RCA WITH A PROMUS PREMIER 3.0 mm x 38 mm DES (post-dilated to 3.6 mm)  PROCEDURE:Consent:  Risks of procedure as well as the alternatives and risks of each were explained to the (patient/caregiver).  Consent for procedure obtained. Consent for signed by MD and patient with RN witness -- placed on chart.   PROCEDURE:  The patient was brought to the 2nd Floor [facility] in the fasting state and prepped and draped in the usual sterile fashion for Left radial access. A modified Allen's test with plethysmography was performed, revealing excellent Ulnar artery collateral flow.  Sterile technique was used including antiseptics, cap, gloves, gown, hand hygiene, mask and sheet.  Skin prep: Chlorhexidine.  Time Out: Verified patient identification, verified procedure, site/side was marked, verified correct patient position, special equipment/implants available, medications/allergies/relevent history reviewed, required imaging and test results available.  Performed  Access: Left Radial Artery; 6 Fr Sheath -- Seldinger technique (Angiocath Micropuncture Kit)  Radial Cocktail, IV Heparin (10 ml, 5000 Units) Diagnostic:  TIG 4.0, JR 4, LCB, AL1)  Left Coronary Artery & SCG-OM Angiography: TIG 4.0  Right Coronary Artery, SVG-RCA, LIMA-LAD Angiography: JR 4  SVG -DIAG Angiography: AL1   LV Hemodynamics: JR4  TR Band:  1120 Hours, 12 mL air  MEDICATIONS:  Anesthesia:  Local Lidocaine 2 ml  Sedation:  2 mg IV Versed, 50 mcg IV fentanyl ;   Omnipaque Contrast: 200 ml  Anticoagulation:  IV Heparin 5000 Units ; Angiomax Bolus & drip  Anti-Platelet Agent:  Brilinta 180 mg  Hemodynamics:  Central Aortic / Mean Pressures: 110/61 mmHg; 82 mmHg  Left Ventricular Pressures / EDP: 110/7 mmHg; 9 mmHg  Left Ventriculography: Not assessed.  Coronary Anatomy:  Left Main: Large-caliber vessel it tapers distally into a 20-30% stenosis.  It bifurcates into the LAD and a small nondominant Circumflex LAD: This is  a moderate to large caliber vessel with a complex focal 70-80% lesion just after the takeoff with ectatic segments on either side.  There is a proximal diagonal that comes off just after this.  The LAD then has essentially subtotal occlusion with competitive flow from the LIMA graft  noted.  D1: Moderate caliber vessel with some ostial involvement of the LAD lesion.  The TIMI-3 flow down this vessel it covers a large portion of the anterolateral wall.  There is no sign of the distal stump from a graft.  Left Circumflex: Small to moderate caliber vessel that essentially courses as an obtuse marginal branch.  There is mild ostial 20% stenosis but otherwise vessels are relatively free of disease.  There is again no evidence of the graft to this vessel.   RCA: Social large-caliber vessel with a long segment of 60-70% stenosis that tapers down to about a 90% stenosis right before the vessel normalizes distally.  Downstream vessel is relatively normal with a small focal 50% stenosis the vessel bifurcates distally into the posterior descending artery where there is to and fro flow from the graft.  It then continues on is the posterior AV groove vessel that gives off one and and 3 major albeit small caliber posterolateral branches.    RPDA: This vessel as it fills via the vein graft.  There is competitive flow from the graft and the native vessel.  Post PCI there actually is starting to be brisker native flow down the PDA with some filling of the distal vein graft   RPL Sysytem:The RPAV moderate large-caliber vessel, 3 RPL branches, no significant disease.  Graft Anatomy:  LIMA-LAD: Widely patent graft with brisk downstream flow.  There is minimal upstream flow, and it does not reach the diagonal branch.  SVG-OM and SVG-Diag - 100% flush occlusion at the aorto-ostium  SVG-RPDA: Large-caliber graft with a focal 99%, subtotal occlusion followed by a short area of irregular, hazy appearing vessel with another 99% stenosis after that.  There is TIMI 1 flow the.  It reaches the grafted vessel.  There is roughly 20 mm of extensively thrombosed/grummous filled graft.  This does not appear to be a very favorable target for PCI.  After reviewing the diagnostic images, the clear-cut culprit  for this sudden onset of symptoms is the extent of the disease SVG-RCA.  I discussed the findings with Dr. Excell Seltzer who was here review the films with me.  Both agreed that the best option would be to proceed with PCI of the native RCA as the vein graft was so extensively disease that there is a high likelihood of distal embolization leading to no reflow from the graft into the native vessel.  Plan to then made to proceed with PCI on the native RCA.  Angiomax bolus was administered and drip was started.  He was given 180 mg Brilinta.  Percutaneous Coronary Intervention:   Guide: 6 Fr   JR 4 Guidewire: BMW, with buddy wire it Pro-Water exchanged for a Clinical cytogeneticist for post dilation Predilation Balloon: Emerge Monorail 2.5 mm x 15 mm;   6 Atm x 30 Sec -- 2 inflations distal and proximal Stent: Promus Premier DES 3.0 mm x 18 mm;   Deployed at 12 Atm x 30 Sec,   Postdilated to 16 Atm x 45 Sec; 3.2 mm Post-dilation Balloon: [place] 3.5 mm x 12 mm; after several unsuccessful attempts to pass [place] Euphora balloons (15 and 20 mm) were unsuccessful, a wiggle wire was advanced  and a shorter balloon was chosen for post dilation.  16 Atm x 45 Sec -- 4 inflations at the same level covering the distal edge to the proximal edge of the stent  Final Diameter: 3.6 mm  Post deployment angiography in multiple views, with and without guidewire in place revealed excellent stent deployment and lesion coverage.  There was no evidence of dissection or perforation.  There remains the small focal 50% lesion downstream from the stent, that is not changed from previous PCI.  PATIENT DISPOSITION:    The patient was transferred to the PACU holding area in a hemodynamicaly stable, chest pain free condition.  The patient tolerated the procedure well, and there were no complications.  EBL:   < 10 ml  The patient was stable before, during, and after the procedure.  POST-OPERATIVE DIAGNOSIS:    Severe ostial LAD disease as  previously described, with progression of disease in the native RCA with extensive long segment of 60 up to 90% stenosis in the mid vessel.  2 of 3 Vein Grafts 100% occluded with subtotal occlusion of the Vein Graft to the RCA that is not amenable to PCI.   Widely patent LIMA-LAD, with minimal restriction of flow in the native circumflex and first diagonal branch.  Successful long segment difficult PCI and proximal-mid RCA lesion with a single Promus Premier DES 3.0 mm x 18 mm postdilated to 3.6 mm  PLAN OF CARE:  Patient will remain overnight for post catheterization evaluation and monitoring.  Anticipate discharge in the morning.  Standard post radial cath care.  Continue dual antiplatelet therapy for minimal in year, but that given the extent and length of stent, would recommend lifelong dual antiplatelet therapy.  We'll continue the nitrate and beta blocker started prior to this procedure.  As no LV gram was performed, we will or an echocardiogram preformed in the outpatient setting prior to followup.   [name], M.D., M.S. [facility] [address] [place], [place]  [address]  [phone]  04/19/2013 11:27 AM

## 2013-04-19 NOTE — Progress Notes (Signed)
Patient post cath he c/o shortness of breathand he became anxious however respirations was stable and oxygen sats stable not sure if this is related to Brilinta . I notified Nada Boozer PA she ordered PRN morphine and he some relief. I will continue to monitor.

## 2013-04-20 ENCOUNTER — Encounter (HOSPITAL_COMMUNITY): Payer: Self-pay | Admitting: Cardiology

## 2013-04-20 DIAGNOSIS — I951 Orthostatic hypotension: Secondary | ICD-10-CM | POA: Diagnosis present

## 2013-04-20 DIAGNOSIS — Z951 Presence of aortocoronary bypass graft: Secondary | ICD-10-CM | POA: Diagnosis not present

## 2013-04-20 DIAGNOSIS — E785 Hyperlipidemia, unspecified: Secondary | ICD-10-CM

## 2013-04-20 DIAGNOSIS — I2510 Atherosclerotic heart disease of native coronary artery without angina pectoris: Secondary | ICD-10-CM | POA: Diagnosis not present

## 2013-04-20 DIAGNOSIS — I25810 Atherosclerosis of coronary artery bypass graft(s) without angina pectoris: Secondary | ICD-10-CM | POA: Diagnosis not present

## 2013-04-20 DIAGNOSIS — I209 Angina pectoris, unspecified: Secondary | ICD-10-CM | POA: Diagnosis not present

## 2013-04-20 LAB — CBC
HCT: 38.1 % — ABNORMAL LOW (ref 39.0–52.0)
Hemoglobin: 12.6 g/dL — ABNORMAL LOW (ref 13.0–17.0)
MCH: 30.7 pg (ref 26.0–34.0)
MCV: 92.7 fL (ref 78.0–100.0)
Platelets: 267 10*3/uL (ref 150–400)
RBC: 4.11 MIL/uL — ABNORMAL LOW (ref 4.22–5.81)

## 2013-04-20 LAB — BASIC METABOLIC PANEL
CO2: 23 mEq/L (ref 19–32)
Calcium: 9.1 mg/dL (ref 8.4–10.5)
Creatinine, Ser: 0.73 mg/dL (ref 0.50–1.35)
Glucose, Bld: 107 mg/dL — ABNORMAL HIGH (ref 70–99)

## 2013-04-20 MED ORDER — ISOSORBIDE MONONITRATE ER 30 MG PO TB24: 15.0000 mg | ORAL_TABLET | Freq: Every day | ORAL | Status: DC

## 2013-04-20 MED ORDER — MIDODRINE HCL 5 MG PO TABS: 5.0000 mg | ORAL_TABLET | Freq: Three times a day (TID) | ORAL | Status: DC

## 2013-04-20 MED ORDER — MIDODRINE HCL 5 MG PO TABS
5.0000 mg | ORAL_TABLET | Freq: Three times a day (TID) | ORAL | Status: DC
  Administered 2013-04-20 – 2013-04-21 (×4): 5 mg via ORAL
  Filled 2013-04-20 (×7): qty 1

## 2013-04-20 MED ORDER — ISOSORBIDE MONONITRATE 15 MG HALF TABLET
15.0000 mg | ORAL_TABLET | Freq: Every day | ORAL | Status: DC
  Administered 2013-04-20 – 2013-04-21 (×2): 15 mg via ORAL
  Filled 2013-04-20 (×2): qty 1

## 2013-04-20 MED ORDER — TICAGRELOR 90 MG PO TABS: 90.0000 mg | ORAL_TABLET | Freq: Two times a day (BID) | ORAL | Status: DC

## 2013-04-20 MED ORDER — METOPROLOL TARTRATE 12.5 MG HALF TABLET
12.5000 mg | ORAL_TABLET | Freq: Two times a day (BID) | ORAL | Status: DC
  Administered 2013-04-20 – 2013-04-21 (×3): 12.5 mg via ORAL
  Filled 2013-04-20 (×5): qty 1

## 2013-04-20 MED ORDER — SODIUM CHLORIDE 0.9 % IV BOLUS (SEPSIS)
500.0000 mL | Freq: Once | INTRAVENOUS | Status: AC
  Administered 2013-04-20: 09:00:00 500 mL via INTRAVENOUS

## 2013-04-20 NOTE — Progress Notes (Signed)
11am orthostatic b/p +, relayed to [facility] PA , pt claimed with slight dizziness when standing up., with new order. See MAR.

## 2013-04-20 NOTE — Care Management Note (Signed)
    Page 1 of 1   04/20/2013     11:05:12 AM   CARE MANAGEMENT NOTE 04/20/2013  Patient:  Joshua Chambers, Joshua Chambers   Account Number:  [id number]  Date Initiated:  04/20/2013  Documentation initiated by:  [facility]  Subjective/Objective Assessment:   adm: dizziness; Angina, class III     Action/Plan:   discharge planning   Anticipated DC Date:  04/20/2013   Anticipated DC Plan:        DC Planning Services  CM consult      Choice offered to / List presented to:             Status of service:  Completed, signed off Medicare Important Message given?   (If response is "NO", the following Medicare IM given date fields will be blank) Date Medicare IM given:   Date Additional Medicare IM given:    Discharge Disposition:  HOME/SELF CARE  Per UR Regulation:    If discussed at Long Length of Stay Meetings, dates discussed:    Comments:  04/20/13 10:00 CM spoke with pt in room concerning Brilinta availability at his pharmacy, [place], [phone]. [facility] soes not have the medication in stock.  [facility], [phone] has it in stock and information was given to pt.  No other CM needs were communicated.  Freddy Jaksch, BSN, CM [phone].

## 2013-04-20 NOTE — Progress Notes (Signed)
12pm pt c/o nausea and vomiting , headache. V/s monitored, b/p 179/92 mmhg  ,prn med given , relayed to Wilburt Finlay PA . Discharge order discontinued , pt for transfer to telemetry for continues observation

## 2013-04-20 NOTE — Progress Notes (Signed)
Subjective: A little dizzy when ambulating.  Normal for him.  Objective: Vital signs in last 24 hours: Temp:  [97.8 F (36.6 C)-98.5 F (36.9 C)] 98.2 F (36.8 C) (10/04 0734) Pulse Rate:  [59-92] 77 (10/04 0734) Resp:  [18-20] 18 (10/04 0734) BP: (127-163)/(67-91) 139/72 mmHg (10/04 0734) SpO2:  [97 %-99 %] 97 % (10/04 0734) Weight:  [208 lb 15.9 oz (94.8 kg)] 208 lb 15.9 oz (94.8 kg) (10/04 0018) Last BM Date: 04/19/13  Intake/Output from previous day: 10/03 0701 - 10/04 0700 In: 1031 [P.O.:600; I.V.:431] Out: 2900 [Urine:2900] Intake/Output this shift:    Medications Current Facility-Administered Medications  Medication Dose Route Frequency Provider Last Rate Last Dose  . 0.9 %  sodium chloride infusion  250 mL Intravenous PRN [name], MD      . acetaminophen (TYLENOL) tablet 650 mg  650 mg Oral Q4H PRN [name], MD   650 mg at 04/19/13 2000  . aspirin chewable tablet 81 mg  81 mg Oral Daily [name], MD      . atorvastatin (LIPITOR) tablet 40 mg  40 mg Oral QHS [name], MD   40 mg at 04/19/13 2240  . dutasteride (AVODART) capsule 0.5 mg  0.5 mg Oral QHS [name], MD   0.5 mg at 04/19/13 2240  . famotidine (PEPCID) tablet 10 mg  10 mg Oral Daily [name], MD   10 mg at 04/19/13 1741  . isosorbide mononitrate (IMDUR) 24 hr tablet 30 mg  30 mg Oral Daily [name], MD      . metoprolol tartrate (LOPRESSOR) tablet 12.5 mg  12.5 mg Oral BID [name], MD   12.5 mg at 04/19/13 2241  . morphine 2 MG/ML injection 2 mg  2 mg Intravenous Q3H PRN Nada Boozer, NP      . nitroGLYCERIN (NITROSTAT) SL tablet 0.4 mg  0.4 mg Sublingual Q5 min PRN [name], MD      . ondansetron [facility]) injection 4 mg  4 mg Intravenous Q6H PRN [name], MD      . sodium chloride 0.9 % injection 3 mL  3 mL Intravenous Q12H [name], MD      . sodium chloride 0.9 % injection 3 mL  3 mL Intravenous PRN [name], MD       . Ticagrelor [facility]) tablet 90 mg  90 mg Oral BID [name], MD   90 mg at 04/19/13 2240    PE: General appearance: alert, cooperative and no distress Lungs: clear to auscultation bilaterally Heart: Regularly irregular, No MM Extremities: No LEE Pulses: 2+ and symmetric Skin: Left wrist cath site:  healing well. no edema or ecchymosis Neurologic: Grossly normal  Lab Results:   Recent Labs  04/17/13 1645 04/20/13 0641  WBC 7.5 8.0  HGB 12.8* 12.6*  HCT 37.9* 38.1*  PLT 306 267   BMET  Recent Labs  04/17/13 1645 04/20/13 0641  NA 137 141  K 4.2 4.0  CL 105 104  CO2 26 23  GLUCOSE 101* 107*  BUN 26* 13  CREATININE 0.96 0.73  CALCIUM 9.3 9.1   PT/INR  Recent Labs  04/17/13 1645  LABPROT 13.1  INR 0.99    Assessment/Plan   Active Problems:   Angina, class III   CAD (coronary artery disease) of artery bypass graft, loss of VG to RCA and VG-Diag   S/P CABG x 4;  LIMA-LAD, SVG-RPDA, SVG-OM, SVG-D1   CAD (coronary artery disease)   Other malaise and fatigue   Dyslipidemia, goal LDL below 70  Plan:   SP Coronary angiography revealing severe ostial LAD disease as previously described, with progression of disease in the native RCA with extensive long segment of 60 up to 90% stenosis in the mid vessel. 2 of 3 Vein Grafts 100% occluded with subtotal occlusion of the Vein Graft to the RCA that is not amenable to PCI. Widely patent LIMA-LAD, with minimal restriction of flow in the native circumflex and first diagonal branch.  Successful long segment difficult PCI and proximal-mid RCA lesion with a single Promus Premier DES 3.0 mm x 18 mm postdilated to 3.6 mm.   Patient ambulated today with complaints of dizziness but also states it is not unusual for him..  Orthostatic BP was positive. 106/60 to 80/60.  Net fluids: -1.9L.    Last echo 02/25/10.  EF 45-50%.  Dr. Herbie Baltimore is planning to do one in the office.  I do not necessarily think he is that volume depleted  as he says it is pretty much his baseline.  He may benefit with at least a 1/2 L of NS prior to going home.      Should be able to dc today.     LOS: 1 day    Allaina Brotzman 04/20/2013 8:40 AM

## 2013-04-20 NOTE — Progress Notes (Signed)
Pt. Seen and examined. Agree with the NP/PA-C note as written.  Was dyspneic yesterday, but that has resolved. Still having problems with dizziness and orthostasis. Auto-diuresed 2L yesterday. BP low normal today and significantly orthostatic. I agree with hydration this am.  Decrease imdur to 15 mg daily. Re-check orthostatics around 11:00. If orthostatic negative, can likely discharge thereafter.  Chrystie Nose, MD, [facility] Attending Cardiologist [facility]

## 2013-04-20 NOTE — Progress Notes (Signed)
CARDIAC REHAB PHASE I   PRE:  Rate/Rhythm: Sinus Brady 56  BP:    Sitting: 100/77     SaO2: 95 Room Air  MODE:  Ambulation: 600 ft   POST:  Rate/Rhythem: Sinus Rhythm PAC's 68  BP:    Sitting: 144/55    SaO2: 98% on room air  [phone]  Patient ambulated in the hallway with assistance times one using rolling walker.  Mr Wilson complained of feeling lightheaded during ambulation 50 feet from the door to his room. Manual sitting blood pressure 106/60.  Standing blood pressure 80/60.  Mr Monday was assisted beck to bed. Recheck blood pressure 120/60. Patient also complained of feeling short of breath. Patients RN notified of complaints. Exercise and diet information reviewed with patient.  Mr Munger is not interested in outpatient cardiac rehab at this time. Call bell within reach. Bed alarm on. Mr Levengood's symptoms resolved after resting in bed a few minutes.  Whitaker, Arta Bruce RN BSN

## 2013-04-21 DIAGNOSIS — I209 Angina pectoris, unspecified: Secondary | ICD-10-CM | POA: Diagnosis not present

## 2013-04-21 DIAGNOSIS — I951 Orthostatic hypotension: Secondary | ICD-10-CM | POA: Diagnosis not present

## 2013-04-21 DIAGNOSIS — I25810 Atherosclerosis of coronary artery bypass graft(s) without angina pectoris: Secondary | ICD-10-CM | POA: Diagnosis not present

## 2013-04-21 DIAGNOSIS — Z951 Presence of aortocoronary bypass graft: Secondary | ICD-10-CM | POA: Diagnosis not present

## 2013-04-21 MED ORDER — MIDODRINE HCL 5 MG PO TABS
10.0000 mg | ORAL_TABLET | Freq: Three times a day (TID) | ORAL | Status: DC
  Filled 2013-04-21 (×2): qty 2

## 2013-04-21 MED ORDER — PROMETHAZINE HCL 12.5 MG PO TABS: 12.5000 mg | ORAL_TABLET | Freq: Three times a day (TID) | ORAL | Status: DC | PRN

## 2013-04-21 MED ORDER — MIDODRINE HCL 10 MG PO TABS: 10.0000 mg | ORAL_TABLET | Freq: Three times a day (TID) | ORAL | Status: DC

## 2013-04-21 NOTE — Progress Notes (Signed)
Subjective: Nausea resolved.  Still dizzy with position change.  Objective: Vital signs in last 24 hours: Temp:  [97.7 F (36.5 C)-98.5 F (36.9 C)] 97.8 F (36.6 C) (10/05 0624) Pulse Rate:  [53-101] 64 (10/05 1040) Resp:  [19-20] 19 (10/05 0624) BP: (61-121)/(40-75) 119/65 mmHg (10/05 1040) SpO2:  [94 %-99 %] 95 % (10/05 0624) Last BM Date: 04/19/13  Intake/Output from previous day: 10/04 0701 - 10/05 0700 In: 360 [P.O.:360] Out: 1350 [Urine:1350] Intake/Output this shift: Total I/O In: 240 [P.O.:240] Out: -   Medications Current Facility-Administered Medications  Medication Dose Route Frequency Provider Last Rate Last Dose  . 0.9 %  sodium chloride infusion  250 mL Intravenous PRN [name], MD      . acetaminophen (TYLENOL) tablet 650 mg  650 mg Oral Q4H PRN [name], MD   650 mg at 04/20/13 1153  . aspirin chewable tablet 81 mg  81 mg Oral Daily [name], MD   81 mg at 04/21/13 1042  . atorvastatin (LIPITOR) tablet 40 mg  40 mg Oral QHS [name], MD   40 mg at 04/20/13 2323  . dutasteride (AVODART) capsule 0.5 mg  0.5 mg Oral QHS [name], MD   0.5 mg at 04/20/13 2323  . famotidine (PEPCID) tablet 10 mg  10 mg Oral Daily [name], MD   10 mg at 04/21/13 1042  . isosorbide mononitrate (IMDUR) 24 hr tablet 15 mg  15 mg Oral Daily Wilburt Finlay, PA-C   15 mg at 04/20/13 1017  . metoprolol tartrate (LOPRESSOR) tablet 12.5 mg  12.5 mg Oral BID Wilburt Finlay, PA-C   12.5 mg at 04/21/13 1042  . midodrine (PROAMATINE) tablet 5 mg  5 mg Oral TID WC Wilburt Finlay, PA-C   5 mg at 04/21/13 0914  . morphine 2 MG/ML injection 2 mg  2 mg Intravenous Q3H PRN Nada Boozer, NP      . nitroGLYCERIN (NITROSTAT) SL tablet 0.4 mg  0.4 mg Sublingual Q5 min PRN [name], MD      . ondansetron [facility]) injection 4 mg  4 mg Intravenous Q6H PRN [name], MD   4 mg at 04/21/13 0825  . sodium chloride 0.9 % injection 3 mL  3 mL Intravenous Q12H  [name], MD   3 mL at 04/20/13 2324  . sodium chloride 0.9 % injection 3 mL  3 mL Intravenous PRN [name], MD      . Ticagrelor [facility]) tablet 90 mg  90 mg Oral BID [name], MD   90 mg at 04/21/13 1042    PE: General appearance: alert, cooperative and no distress  Lungs: clear to auscultation bilaterally  Heart: Regularly irregular, No MM  Extremities: No LEE  Pulses: 2+ and symmetric  Skin: Left wrist cath site: healing well. no edema or ecchymosis  Neurologic: Grossly normal    Lab Results:   Recent Labs  04/20/13 0641  WBC 8.0  HGB 12.6*  HCT 38.1*  PLT 267   BMET  Recent Labs  04/20/13 0641  NA 141  K 4.0  CL 104  CO2 23  GLUCOSE 107*  BUN 13  CREATININE 0.73  CALCIUM 9.1     Assessment/Plan  Active Problems:   Angina, class III   CAD (coronary artery disease) of artery bypass graft, loss of VG to RCA and VG-Diag   S/P CABG x 4; LIMA-LAD, SVG-RPDA, SVG-OM, SVG-D1  CAD (coronary artery disease)   Other malaise and fatigue   Dyslipidemia, goal LDL below 70   Orthostatic hypotension  Plan: SP Successful long segment difficult PCI and proximal-mid RCA lesion with a single Promus Premier DES 3.0 mm x 18 mm postdilated to 3.6 mm.   We started midodrine 5mg  TID yesterday and gave of NS.  He still is severely orthostatic.  Will increase midodrine to 10mg .  I&O are not accurate.  DC home today.        LOS: 2 days    Dymond Gutt 04/21/2013 11:13 AM

## 2013-04-21 NOTE — Progress Notes (Signed)
04/21/2013 1120 NCM spoke to pt and states he may go home today. Lives at home with wife. NCM explained to pt Brillinta card will need to be activated and if dc home today will need to go to a pharmacy that is open on Sunday to get medication. Pt states he is having some n/v and is requesting something to take at home. NCM made Unit RN aware and will follow up with attending. Pt may benefit for [facility] RN to assist post dc with disease management and medication management. Isidoro Donning RN CCM Case Mgmt phone [phone]

## 2013-04-21 NOTE — Discharge Summary (Signed)
Physician Discharge Summary  Patient ID: MARTEZ WEIAND MRN: [id number] DOB/AGE: [birth year] 77 y.o.  Admit date: 04/19/2013 Discharge date: 04/21/2013  Admission Diagnoses:  Angina-Class III  Discharge Diagnoses:  Active Problems:   Angina, class III   CAD (coronary artery disease) of artery bypass graft, loss of VG to RCA and VG-Diag   S/P CABG x 4; LIMA-LAD, SVG-RPDA, SVG-OM, SVG-D1   CAD (coronary artery disease)   Other malaise and fatigue   Dyslipidemia, goal LDL below 70   Orthostatic hypotension   Discharged Condition: stable  Hospital Course:   Joshua Chambers is a 79 y.o. male with a PMH below who presents today for urgent walk-in visit to evaluate breathlessness, weakness and fatigue.  He is a very pleasant gentleman who I been following for a few years now. He had a history of coronary disease with CABG in 2007. Mostly what I been treating for is his orthostatic hypotension.  Interval History: He called in today to be seen as a walk-in visit due to breathlessness. He describes as feeling the same his he did before his bypass surgery which was followed and it. He says he feels just tired and fatigued. Just walking around the office here makes him tired. He notes dyspnea with walking. And when he gets to sit down he feels a pressure and tightness in his chest. He is now noted this simply walking across the office lobby. A few days ago he was able to do much more than this. His feels weak and lethargic. He notes waking up short of breath with PND type symptoms, but denies orthopnea at baseline. He does not have any edema. One episode was associated with a very brief period of what sound like right hemianopsia. That was quickly resolved. He has noted some irregular heartbeats flip-flopping in his chest. They haven't felt like as a fast heartbeat however.  The remainder of Cardiovascular ROS: negative for - edema, loss of consciousness, murmur, orthopnea or rapid heart rate:  Additional  cardiac review of systems:  Lightheadedness - yes, dizziness - yes, syncope/near-syncope - near-syncope; TIA/amaurosis fugax - that one brief episode with mild hemianopsia, but otherwise no  Melena - no, hematochezia no; hematuria - no; nosebleeds - no; claudication - no  The patient presented for left hear cath and underwent successful long segment difficult PCI and proximal-mid RCA lesion with a single Promus Premier DES 3.0 mm x 18 mm postdilated to 3.6 mm.  He was kept overnight for observation.  He was having problems with dizziness, which apparently is chronic.  Orthostatic BP was severely reduced with change in position.  He was given a bolus of NS and started on midodrine 5mg  which gave him nausea and vomiting.  He was kept an additional night.  Nausea resolved however, he remained orthostatic.  Midodrine was increased to 10mg  TID.  We discussed slowly changing positions to avoid dizziness.  The patient was seen by Dr. Rennis Golden who felt he was stable for DC home.  Fu with Dr. Herbie Baltimore in 7-10 days.      Consults: none  Significant Diagnostic Studies:  Left heart cath Access: Left Radial Artery; 6 Fr Sheath -- Seldinger technique (Angiocath Micropuncture Kit)  Radial Cocktail, IV Heparin (10 ml, 5000 Units) Diagnostic: TIG 4.0, JR 4, LCB, AL1)  Left Coronary Artery & SCG-OM Angiography: TIG 4.0  Right Coronary Artery, SVG-RCA, LIMA-LAD Angiography: JR 4  SVG -DIAG Angiography: AL1  LV Hemodynamics: JR4 TR Band: 1120 Hours, 12 mL  air  MEDICATIONS:  Anesthesia: Local Lidocaine 2 ml Sedation: 2 mg IV Versed, 50 mcg IV fentanyl ;  Omnipaque Contrast: 200 ml  Anticoagulation: IV Heparin 5000 Units ; Angiomax Bolus & drip  Anti-Platelet Agent: Brilinta 180 mg Hemodynamics:  Central Aortic / Mean Pressures: 110/61 mmHg; 82 mmHg  Left Ventricular Pressures / EDP: 110/7 mmHg; 9 mmHg Left Ventriculography: Not assessed.  Coronary Anatomy:  Left Main: Large-caliber vessel it tapers  distally into a 20-30% stenosis. It bifurcates into the LAD and a small nondominant Circumflex LAD: This is a moderate to large caliber vessel with a complex focal 70-80% lesion just after the takeoff with ectatic segments on either side. There is a proximal diagonal that comes off just after this. The LAD then has essentially subtotal occlusion with competitive flow from the LIMA graft noted.  D1: Moderate caliber vessel with some ostial involvement of the LAD lesion. The TIMI-3 flow down this vessel it covers a large portion of the anterolateral wall. There is no sign of the distal stump from a graft. Left Circumflex: Small to moderate caliber vessel that essentially courses as an obtuse marginal branch. There is mild ostial 20% stenosis but otherwise vessels are relatively free of disease. There is again no evidence of the graft to this vessel.  RCA: Social large-caliber vessel with a long segment of 60-70% stenosis that tapers down to about a 90% stenosis right before the vessel normalizes distally. Downstream vessel is relatively normal with a small focal 50% stenosis the vessel bifurcates distally into the posterior descending artery where there is to and fro flow from the graft. It then continues on is the posterior AV groove vessel that gives off one and and 3 major albeit small caliber posterolateral branches.  RPDA: This vessel as it fills via the vein graft. There is competitive flow from the graft and the native vessel.  Post PCI there actually is starting to be brisker native flow down the PDA with some filling of the distal vein graft  RPL Sysytem:The RPAV moderate large-caliber vessel, 3 RPL branches, no significant disease. Graft Anatomy:  LIMA-LAD: Widely patent graft with brisk downstream flow. There is minimal upstream flow, and it does not reach the diagonal branch.  SVG-OM and SVG-Diag - 100% flush occlusion at the aorto-ostium  SVG-RPDA: Large-caliber graft with a focal 99%, subtotal  occlusion followed by a short area of irregular, hazy appearing vessel with another 99% stenosis after that. There is TIMI 1 flow the. It reaches the grafted vessel. There is roughly 20 mm of extensively thrombosed/grummous filled graft. This does not appear to be a very favorable target for PCI. After reviewing the diagnostic images, the clear-cut culprit for this sudden onset of symptoms is the extent of the disease SVG-RCA. I discussed the findings with Dr. Excell Seltzer who was here review the films with me. Both agreed that the best option would be to proceed with PCI of the native RCA as the vein graft was so extensively disease that there is a high likelihood of distal embolization leading to no reflow from the graft into the native vessel. Plan to then made to proceed with PCI on the native RCA.  Angiomax bolus was administered and drip was started. He was given 180 mg Brilinta.  Percutaneous Coronary Intervention:  Guide: 6 Fr JR 4 Guidewire: BMW, with buddy wire it Pro-Water exchanged for a Wiggle Wire for post dilation  Predilation Balloon: Emerge Monorail 2.5 mm x 15 mm;  6 Atm x  30 Sec -- 2 inflations distal and proximal Stent: Promus Premier DES 3.0 mm x 18 mm;  Deployed at 12 Atm x 30 Sec,  Postdilated to 16 Atm x 45 Sec; 3.2 mm Post-dilation Balloon: [place] Trek 3.5 mm x 12 mm; after several unsuccessful attempts to pass  Euphora balloons (15 and 20 mm) were unsuccessful, a wiggle wire was advanced and a shorter balloon was chosen for post dilation.  16 Atm x 45 Sec -- 4 inflations at the same level covering the distal edge to the proximal edge of the stent  Final Diameter: 3.6 mm Post deployment angiography in multiple views, with and without guidewire in place revealed excellent stent deployment and lesion coverage. There was no evidence of dissection or perforation. There remains the small focal 50% lesion downstream from the stent, that is not changed from previous PCI.  PATIENT DISPOSITION:   The patient was transferred to the PACU holding area in a hemodynamicaly stable, chest pain free condition.  The patient tolerated the procedure well, and there were no complications. EBL: < 10 ml  The patient was stable before, during, and after the procedure. POST-OPERATIVE DIAGNOSIS:  Severe ostial LAD disease as previously described, with progression of disease in the native RCA with extensive long segment of 60 up to 90% stenosis in the mid vessel.  2 of 3 Vein Grafts 100% occluded with subtotal occlusion of the Vein Graft to the RCA that is not amenable to PCI.  Widely patent LIMA-LAD, with minimal restriction of flow in the native circumflex and first diagonal branch.  Successful long segment difficult PCI and proximal-mid RCA lesion with a single Promus Premier DES 3.0 mm x 18 mm postdilated to 3.6 mm PLAN OF CARE:  Patient will remain overnight for post catheterization evaluation and monitoring. Anticipate discharge in the morning.  Standard post radial cath care.  Continue dual antiplatelet therapy for minimal in year, but that given the extent and length of stent, would recommend lifelong dual antiplatelet therapy.  We'll continue the nitrate and beta blocker started prior to this procedure.  As no LV gram was performed, we will or an echocardiogram preformed in the outpatient setting prior to followup. [name], M.D., M.S.  [facility]  [address]  [place], [place] [address]  [phone]  04/19/2013  11:27 AM    Treatments:See above  Discharge Exam: Blood pressure 92/56, pulse 70, temperature 97.6 F (36.4 C), temperature source Oral, resp. rate 18, height 6\' 2"  (1.88 m), weight 208 lb 15.9 oz (94.8 kg), SpO2 100.00%.   Disposition: 01-Home or Self Care      Discharge Orders   Future Appointments Provider Department Dept Phone   04/30/2013 10:00 AM [name], MD [facility] [phone]   Future  Orders Complete By Expires   Diet - low sodium heart healthy  As directed    Discharge instructions  As directed    Comments:     No lifting more than a half gallon of milk for three days.   Increase activity slowly  As directed        Medication List         aspirin EC 81 MG tablet  Take 81 mg by mouth at bedtime.     atorvastatin 40 MG tablet  Commonly known as:  LIPITOR  Take 40 mg by mouth at bedtime.     dutasteride 0.5 MG capsule  Commonly known as:  AVODART  Take  0.5 mg by mouth at bedtime.     isosorbide mononitrate 30 MG 24 hr tablet  Commonly known as:  IMDUR  Take 0.5 tablets (15 mg total) by mouth daily.     metoprolol tartrate 25 MG tablet  Commonly known as:  LOPRESSOR  Take 0.5 tablets (12.5 mg total) by mouth 2 (two) times daily.     midodrine 10 MG tablet  Commonly known as:  PROAMATINE  Take 1 tablet (10 mg total) by mouth 3 (three) times daily with meals.     nitroGLYCERIN 0.4 MG SL tablet  Commonly known as:  NITROSTAT  Place 1 tablet (0.4 mg total) under the tongue every 5 (five) minutes as needed for chest pain.     promethazine 12.5 MG tablet  Commonly known as:  PHENERGAN  Take 1 tablet (12.5 mg total) by mouth every 8 (eight) hours as needed for nausea (To be taken prior to midodrine if needed.).     ranitidine 150 MG tablet  Commonly known as:  ZANTAC  Take 150 mg by mouth 2 (two) times daily as needed for heartburn.     REFRESH OP  Place 1 drop into both eyes daily as needed.     Ticagrelor 90 MG Tabs tablet  Commonly known as:  BRILINTA  Take 1 tablet (90 mg total) by mouth 2 (two) times daily.       Follow-up Information   Follow up with [name], MD On 04/30/2013. (at 10:00 AM)    Specialty:  Cardiology   Contact information:   [address] [place] [address] [phone]       Signed: Wilburt Finlay 04/21/2013, 1:57 PM

## 2013-04-21 NOTE — Progress Notes (Signed)
   CARE MANAGEMENT NOTE 04/21/2013  Patient:  Joshua Chambers, Joshua Chambers   Account Number:  [id number]  Date Initiated:  04/20/2013  Documentation initiated by:  [facility]  Subjective/Objective Assessment:   adm: dizziness; Angina, class III     Action/Plan:   discharge planning   Anticipated DC Date:  04/20/2013   Anticipated DC Plan:        DC Planning Services  CM consult  Medication Assistance      Choice offered to / List presented to:             Status of service:  Completed, signed off Medicare Important Message given?   (If response is "NO", the following Medicare IM given date fields will be blank) Date Medicare IM given:   Date Additional Medicare IM given:    Discharge Disposition:  HOME/SELF CARE  Per UR Regulation:    If discussed at Long Length of Stay Meetings, dates discussed:    Comments:  04/21/13 13:00 CM activated Brilinta card for pt in room. [facility] is open until 6:00pm and has the pt's medication in stock.  Pt stated he remembered me telling him that last evening and will go to [facility] for his prescription of Brilinta.  No other CM needs were communicated, Freddy Jaksch, BSN, [place] [phone]  04/21/2013 1120 NCM spoke to pt and states he may go home today. Lives at home with wife. NCM explained to pt Brillinta card will need to be activated and if dc home today will need to go to a pharmacy that is open on Sunday to get medication. Pt states he is having some n/v and is requesting something to take at home. NCM made Unit RN aware and will follow up with attending. Pt may benefit for [facility] RN to assist post dc with disease management and medication management. Isidoro Donning RN CCM Case Mgmt phone [phone]    04/20/13 10:00 CM spoke with pt in room concerning Brilinta availability at his pharmacy, [place], [phone]. [facility] does not have the medication in stock.  [facility], [phone] has it in stock and information was  given to pt.  No other CM needs were communicated.  Freddy Jaksch, BSN, CM [phone].

## 2013-04-21 NOTE — Progress Notes (Signed)
Spoke with patient multiple times this morning about the need for medication for his orthostatic hypotension. Patient is reluctant to take the medication, he states he doesn't feel he needs it and wants to go home. Orthostatic blood pressures were done this morning. See doc flow sheets. When sitting back into the bed patient was unsteady and landed forcefully on the bed. Bed alarm in use. Patient instructed to call and remain in bed until staff are able to help him up. Will continue to monitor.

## 2013-04-21 NOTE — Progress Notes (Signed)
Pt. Seen and examined. Agree with the NP/PA-C note as written.  Less dizzy today, but remains orthostatic. I think nausea was due to taking midodrine on an empty stomach. I would recommend 10 mg TID.  He should remain well-hydrated. Ok for discharge today. He can get Brillinta samples in our office. Follow-up with MLP or Dr. Herbie Baltimore in 7-10 days.  Chrystie Nose, MD, [facility] Attending Cardiologist [facility]

## 2013-04-21 NOTE — Progress Notes (Signed)
Discharge teaching completed. All questions answered. Prescriptions and handouts given to patient. Patient aware of all follow-up appointments.

## 2013-04-22 ENCOUNTER — Encounter: Payer: Self-pay | Admitting: Cardiology

## 2013-04-23 ENCOUNTER — Telehealth: Payer: Self-pay | Admitting: Cardiology

## 2013-04-23 ENCOUNTER — Ambulatory Visit (INDEPENDENT_AMBULATORY_CARE_PROVIDER_SITE_OTHER): Payer: Medicare Other | Admitting: Emergency Medicine

## 2013-04-23 VITALS — BP 132/86 | HR 80 | Temp 98.0°F | Resp 18 | Ht 72.0 in | Wt 212.0 lb

## 2013-04-23 DIAGNOSIS — R319 Hematuria, unspecified: Secondary | ICD-10-CM

## 2013-04-23 LAB — POCT CBC
Lymph, poc: 1.3 (ref 0.6–3.4)
MCH, POC: 31.4 pg — AB (ref 27–31.2)
MCHC: 31.8 g/dL (ref 31.8–35.4)
MID (cbc): 0.5 (ref 0–0.9)
MPV: 8.3 fL (ref 0–99.8)
POC Granulocyte: 6.5 (ref 2–6.9)
POC MID %: 6.1 %M (ref 0–12)
Platelet Count, POC: 320 10*3/uL (ref 142–424)
RBC: 3.92 M/uL — AB (ref 4.69–6.13)
RDW, POC: 13.2 %
WBC: 8.3 10*3/uL (ref 4.6–10.2)

## 2013-04-23 LAB — POCT URINALYSIS DIPSTICK
Glucose, UA: 100
Ketones, UA: 80
Nitrite, UA: POSITIVE
Spec Grav, UA: 1.01
Urobilinogen, UA: >=8

## 2013-04-23 LAB — POCT UA - MICROSCOPIC ONLY
Epithelial cells, urine per micros: NEGATIVE
WBC, Ur, HPF, POC: NEGATIVE

## 2013-04-23 MED FILL — Sodium Chloride IV Soln 0.9%: INTRAVENOUS | Qty: 50 | Status: AC

## 2013-04-23 NOTE — Telephone Encounter (Signed)
Bleeding a lot .

## 2013-04-23 NOTE — Progress Notes (Signed)
[facility] [address], [place] [address] [phone]  Date:  04/23/2013   Name:  Joshua Chambers   DOB:  [birth year]   MRN:  [id number]  PCP:  Pearla Dubonnet, MD    Chief Complaint: Hematuria   History of Present Illness:  Joshua Chambers is a 79 y.o. very pleasant male patient who presents with the following:  Discharged from hospital following a PCTA with stenting.  Started on Brilant and continued on aspirin.  Yesterday evening noted blood in his urine with clots.  No fever or chills.  No nausea or vomiting no hematochezia or black stools.  No dizziness or orthostatic changes.  Has some chronic unsteadiness and uses a cane.  Is convinced that he has a "problem" with his prostate.  Spoke with his doctor's office who warned him to stop the ASA.  Had no foley placed and had no preprocedure dysuria, urgency or frequency.  Has history of several UTI's in past.  Also treated previously with three way foley and irrigation for bleeding and clots.  No improvement with over the counter medications or other home remedies. Denies other complaint or health concern today.   Patient Active Problem List   Diagnosis Date Noted  . Orthostatic hypotension 04/20/2013  . Angina, class III 04/17/2013  . CAD (coronary artery disease) of artery bypass graft, loss of VG to RCA and VG-Diag 04/17/2013  . CAD (coronary artery disease) 04/17/2013  . Other and unspecified coagulation defects 04/17/2013  . Preoperative examination, unspecified 04/17/2013  . Other malaise and fatigue 04/17/2013  . Dyslipidemia, goal LDL below 70   . S/P CABG x 4; LIMA-LAD, SVG-RPDA, SVG-OM, SVG-D1 03/18/2006    Class: Chronic    Past Medical History  Diagnosis Date  . CAD (coronary artery disease), native coronary artery 2007    Multivessel CAD referred for CABG  . S/P CABG x 3 2007    LIMA-LAD, SVG-DIAG, SVG-RCA  . History of nuclear stress test April 2009    6 min, 10 METS, diaphragmatic  attenuation, no ischemia.  . Abnormal echocardiogram 2011    EF 45-50%, mild HK of anterior septum.  [name] History of stroke 2011    Possible TIA in 2001  . Hx of orthostatic hypotension 2009    Positive tilt table:  dizziness/vasopressor type; previously on the decision   . Dyslipidemia, goal LDL below 70   . Hx SBO   . History of DVT of lower extremity     Right popliteal vein  . Nephrolithiasis   . Arthritis     Past Surgical History  Procedure Laterality Date  . Coronary artery bypass graft  2007    LIMA-LAD, SVG-D1, SVG-RCA  . Appendectomy    . Tonsillectomy    . Colon surgery    . Coronary angioplasty with stent placement  04/19/13    Stent-Promus DES to native RCA prox-mid, found closure of VG to RCA,  closed graft to diag, patent LIMA    History  Substance Use Topics  . Smoking status: Never Smoker   . Smokeless tobacco: Never Used  . Alcohol Use: No    History reviewed. No pertinent family history.  Allergies  Allergen Reactions  . Amiodarone Other (See Comments)    Extreme dehydration  . Diltiazem Other (See Comments)    Pt unsure of reaction  . Sulfa Antibiotics Other (See Comments)    Pt unsure of reaction    Medication list has been reviewed  and updated.  Current Outpatient Prescriptions on File Prior to Visit  Medication Sig Dispense Refill  . aspirin EC 81 MG tablet Take 81 mg by mouth at bedtime.       [name] atorvastatin (LIPITOR) 40 MG tablet Take 40 mg by mouth at bedtime.       . dutasteride (AVODART) 0.5 MG capsule Take 0.5 mg by mouth at bedtime.      . isosorbide mononitrate (IMDUR) 30 MG 24 hr tablet Take 0.5 tablets (15 mg total) by mouth daily.  30 tablet  5  . metoprolol tartrate (LOPRESSOR) 25 MG tablet Take 0.5 tablets (12.5 mg total) by mouth 2 (two) times daily.  30 tablet  6  . midodrine (PROAMATINE) 10 MG tablet Take 1 tablet (10 mg total) by mouth 3 (three) times daily with meals.  90 tablet  5  . nitroGLYCERIN (NITROSTAT) 0.4 MG SL  tablet Place 1 tablet (0.4 mg total) under the tongue every 5 (five) minutes as needed for chest pain.  25 tablet  3  . Polyvinyl Alcohol-Povidone (REFRESH OP) Place 1 drop into both eyes daily as needed.      . promethazine (PHENERGAN) 12.5 MG tablet Take 1 tablet (12.5 mg total) by mouth every 8 (eight) hours as needed for nausea (To be taken prior to midodrine if needed.).  30 tablet  2  . ranitidine (ZANTAC) 150 MG tablet Take 150 mg by mouth 2 (two) times daily as needed for heartburn.       . Ticagrelor (BRILINTA) 90 MG TABS tablet Take 1 tablet (90 mg total) by mouth 2 (two) times daily.  60 tablet  10   No current facility-administered medications on file prior to visit.    Review of Systems:  As per HPI, otherwise negative.    Physical Examination: Filed Vitals:   04/23/13 1657  BP: 132/86  Pulse: 80  Temp: 98 F (36.7 C)  Resp: 18   Filed Vitals:   04/23/13 1657  Height: 6' (1.829 m)  Weight: 212 lb (96.163 kg)   Body mass index is 28.75 kg/(m^2). Ideal Body Weight: Weight in (lb) to have BMI = 25: 183.9   GEN: WDWN, NAD, Non-toxic, Alert & Oriented x 3 HEENT: Atraumatic, Normocephalic.  Ears and Nose: No external deformity. EXTR: No clubbing/cyanosis/edema NEURO: Normal gait.  PSYCH: Normally interactive. Conversant. Not depressed or anxious appearing.  Calm demeanor.    Assessment and Plan: Gross hematuria with clots To ER for evaluation of clotting.   Signed,  Phillips Odor, MD   Results for orders placed in visit on 04/23/13  POCT URINALYSIS DIPSTICK      Result Value Range   Color, UA burgundy     Clarity, UA       Glucose, UA 100     Bilirubin, UA large     Ketones, UA 80     Spec Grav, UA 1.010     Blood, UA large     pH, UA 8.5     Protein, UA 300     Urobilinogen, UA >=8.0     Nitrite, UA pos     Leukocytes, UA large (3+)    POCT UA - MICROSCOPIC ONLY      Result Value Range   WBC, Ur, HPF, POC neg     RBC, urine, microscopic  tntc     Bacteria, U Microscopic 4+     Mucus, UA neg     Epithelial cells, urine per micros neg  Crystals, Ur, HPF, POC neg     Casts, Ur, LPF, POC RBC     Yeast, UA neg    POCT CBC      Result Value Range   WBC 8.3  4.6 - 10.2 K/uL   Lymph, poc 1.3  0.6 - 3.4   POC LYMPH PERCENT 15.8  10 - 50 %L   MID (cbc) 0.5  0 - 0.9   POC MID % 6.1  0 - 12 %M   POC Granulocyte 6.5  2 - 6.9   Granulocyte percent 78.1  37 - 80 %G   RBC 3.92 (*) 4.69 - 6.13 M/uL   Hemoglobin 12.3 (*) 14.1 - 18.1 g/dL   HCT, POC 40.9 (*) 81.1 - 53.7 %   MCV 98.6 (*) 80 - 97 fL   MCH, POC 31.4 (*) 27 - 31.2 pg   MCHC 31.8  31.8 - 35.4 g/dL   RDW, POC 91.4     Platelet Count, POC 320  142 - 424 K/uL   MPV 8.3  0 - 99.8 fL

## 2013-04-23 NOTE — Telephone Encounter (Signed)
Pt. Informed to stop his asa 81mg . Pt. Stated understanding of instructions

## 2013-04-23 NOTE — Telephone Encounter (Signed)
Pt. Stated that about an hour ago he started seeing blood in his urin. I talked to [place] or Pharm. D here in the office and was in formed that pt. Needed to go to urgent care or see PCP today to evaluate this new development.Pt. Stated understanding of instructions. And a note was sent to Dr. Herbie Baltimore and I spoke to Dr. Herbie Baltimore and received orders to tell pt. To stop his asa. I will call the pt. Back and let him know to stop his asa.

## 2013-04-24 DIAGNOSIS — R310 Gross hematuria: Secondary | ICD-10-CM | POA: Diagnosis not present

## 2013-04-25 ENCOUNTER — Telehealth: Payer: Self-pay | Admitting: Cardiology

## 2013-04-25 NOTE — Telephone Encounter (Signed)
Wanted to let Dr.Harding know that he has develop an infection and he was just discharge from the hospital on Sunday .[name] Please Call   Thanks

## 2013-04-25 NOTE — Telephone Encounter (Signed)
Spoke to patient. He states the urgent care wanted him to call an update on his condition with urinary bleeding. He went to Urologist per patient they scoped bladder and told he it was inflamed and gave him an antiobiotc to take. He states he was just following direction.  Informed him --let Dr  Herbie Baltimore know

## 2013-04-26 NOTE — Progress Notes (Signed)
Patient received information during pre cath on 04/19/2013

## 2013-04-30 ENCOUNTER — Ambulatory Visit (INDEPENDENT_AMBULATORY_CARE_PROVIDER_SITE_OTHER): Payer: Medicare Other | Admitting: Cardiology

## 2013-04-30 ENCOUNTER — Encounter: Payer: Self-pay | Admitting: Cardiology

## 2013-04-30 VITALS — BP 118/80 | HR 74 | Ht 75.0 in | Wt 207.3 lb

## 2013-04-30 DIAGNOSIS — E785 Hyperlipidemia, unspecified: Secondary | ICD-10-CM

## 2013-04-30 DIAGNOSIS — R0609 Other forms of dyspnea: Secondary | ICD-10-CM

## 2013-04-30 DIAGNOSIS — R319 Hematuria, unspecified: Secondary | ICD-10-CM

## 2013-04-30 DIAGNOSIS — I25810 Atherosclerosis of coronary artery bypass graft(s) without angina pectoris: Secondary | ICD-10-CM | POA: Diagnosis not present

## 2013-04-30 DIAGNOSIS — I209 Angina pectoris, unspecified: Secondary | ICD-10-CM

## 2013-04-30 DIAGNOSIS — Z951 Presence of aortocoronary bypass graft: Secondary | ICD-10-CM

## 2013-04-30 DIAGNOSIS — Z9861 Coronary angioplasty status: Secondary | ICD-10-CM | POA: Diagnosis not present

## 2013-04-30 DIAGNOSIS — I2510 Atherosclerotic heart disease of native coronary artery without angina pectoris: Secondary | ICD-10-CM

## 2013-04-30 DIAGNOSIS — I10 Essential (primary) hypertension: Secondary | ICD-10-CM

## 2013-04-30 DIAGNOSIS — R0600 Dyspnea, unspecified: Secondary | ICD-10-CM

## 2013-04-30 HISTORY — DX: Dyspnea, unspecified: R06.00

## 2013-04-30 MED ORDER — CLOPIDOGREL BISULFATE 75 MG PO TABS: 75.0000 mg | ORAL_TABLET | Freq: Every day | ORAL | Status: DC

## 2013-04-30 NOTE — Progress Notes (Signed)
PCP: Pearla Dubonnet, MD  Clinic Note: Chief Complaint  Patient presents with  . Follow-up    post cath/PCI, URINARY BLEEDING , NO CHEST PAIN, TIRED, DIFFICULTY TIME BREATHING, NO EDEMA,NO PROBLEM WITH RIGHT RADIAL SITE,PROBLEM BREATHING YESTERDAY    HPI: Joshua Chambers is a 79 y.o. male with a PMH below who presents today for urgent walk-in visit to evaluate breathlessness, weakness and fatigue. He is a very pleasant gentleman who I been following for a few years now.  He had a history of coronary disease with CABG in 2007.  Mostly what I been treating for is his orthostatic hypotension.  I saw him back on October 1 for progressively worsening dyspnea with exertion and chest pressure. These symptoms are concerning for unstable angina, so I referred him for diagnostic catheterization. To the Cath Lab on October 3 and found occluded vein grafts to the diagonal and OM with near occlusion of the vein graft to the RCA. The LIMA was patent. After discussing it with colleagues and we decided that the best option was to proceed with PCI of the native RCA and not attempt intervention on the vein graft. The mid RCA was treated in a long segment with a 3.0 mm x 38 mm Promus Premier DES postdilated up to 3.6 mm. He was discharged on Brilinta plus aspirin.  Interval History: Since his discharge, he has had a relatively difficult course with brisk hematuria for which he went to see a urologist. He had a cystoscopy performed which showed possible UTI. He was placed on antibiotics. That has improved these any further hematuria. He's not had any melena or hematochezia. Also noted profound dyspnea is different than it was before his cath he basophilic he cannot catch his breath this occurs with lying down with sitting up or with walking and nothing really makes it worse or better. He just feels like he can't catch his breath. He has no more chest tightness or pressure. His oxygen saturation today was 97%  despite the sensation of dyspnea. He said it or relieve it and it yesterday a evening and overnight. But today he feels much better  The remainder of Cardiovascular ROS: positive for - dyspnea on exertion, orthopnea and shortness of breath negative for - chest pain, edema, irregular heartbeat, loss of consciousness, murmur, palpitations, paroxysmal nocturnal dyspnea or rapid heart rate: Additional cardiac review of systems: Lightheadedness / dizziness - yes - when dyspneic, syncope/near-syncope - near-syncope; TIA/amaurosis fugax - no further episodes; claudication - no  Past Medical History  Diagnosis Date  . CAD (coronary artery disease), native coronary artery 2007    Multivessel CAD referred for CABG  . S/P CABG x 3 2007    LIMA-LAD, SVG-DIAG, SVG-RCA  . History of nuclear stress test April 2009    6 min, 10 METS, diaphragmatic attenuation, no ischemia.  . Abnormal echocardiogram 2011    EF 45-50%, mild HK of anterior septum.  [name] History of stroke 2011    Possible TIA in 2001  . Hx of orthostatic hypotension 2009    Positive tilt table:  dizziness/vasopressor type; previously on the decision   . Dyslipidemia, goal LDL below 70   . Hx SBO   . History of DVT of lower extremity     Right popliteal vein  . Nephrolithiasis   . Arthritis   . CAD S/P percutaneous coronary angioplasty 04/19/2013    PCI of Native RCA - Promus Premier DES 3.0 mm x 38 mm (3.6 mm)  .  CAD (coronary artery disease), autologous vein bypass graft 04/19/2013    100% flush occlusion of SVG-OM & SVG-Diag; near Subtotal Occlusion of SVG-RCA (PCI of RCA done to avoid distal embolization with PCI to SVG)    Prior Cardiac Evaluation and Past Surgical History: Past Surgical History  Procedure Laterality Date  . Coronary artery bypass graft  2007    LIMA-LAD, SVG-D1, SVG-RCA  . Appendectomy    . Tonsillectomy    . Colon surgery    . Coronary angioplasty with stent placement  04/19/13    Stent-Promus DES (3.0 mm x  38 mm - 3.6 mm) to native RCA prox-mid, found closure of VG to RCA,  closed graft to diag, patent LIMA    Allergies  Allergen Reactions  . Amiodarone Other (See Comments)    Extreme dehydration  . Diltiazem Other (See Comments)    Pt unsure of reaction  . Sulfa Antibiotics Other (See Comments)    Pt unsure of reaction    Current Outpatient Prescriptions  Medication Sig Dispense Refill  . aspirin EC 81 MG tablet Take 81 mg by mouth at bedtime.       [name] atorvastatin (LIPITOR) 40 MG tablet Take 40 mg by mouth at bedtime.       . ciprofloxacin (CIPRO) 500 MG tablet Take 500 mg by mouth 2 (two) times daily.      [name] dutasteride (AVODART) 0.5 MG capsule Take 0.5 mg by mouth at bedtime.      . isosorbide mononitrate (IMDUR) 30 MG 24 hr tablet Take 0.5 tablets (15 mg total) by mouth daily.  30 tablet  5  . metoprolol tartrate (LOPRESSOR) 25 MG tablet Take 0.5 tablets (12.5 mg total) by mouth 2 (two) times daily.  30 tablet  6  . midodrine (PROAMATINE) 10 MG tablet Take 1 tablet (10 mg total) by mouth 3 (three) times daily with meals.  90 tablet  5  . nitroGLYCERIN (NITROSTAT) 0.4 MG SL tablet Place 1 tablet (0.4 mg total) under the tongue every 5 (five) minutes as needed for chest pain.  25 tablet  3  . Polyvinyl Alcohol-Povidone (REFRESH OP) Place 1 drop into both eyes daily as needed.      . ranitidine (ZANTAC) 150 MG tablet Take 150 mg by mouth 2 (two) times daily as needed for heartburn.       . clopidogrel (PLAVIX) 75 MG tablet Take 1 tablet (75 mg total) by mouth daily.  90 tablet  3  . promethazine (PHENERGAN) 12.5 MG tablet Take 1 tablet (12.5 mg total) by mouth every 8 (eight) hours as needed for nausea (To be taken prior to midodrine if needed.).  30 tablet  2   No current facility-administered medications for this visit.    History   Social History Narrative    He is now remarried for the last 2 years, his first wife of 60 years is deceased. He has 1   child from that first  marriage and 2 grandchildren.    He does not smoke, does not drink.    He is not very active but he does kind of meander around with his new wife who is permanently disabled.    FAMILY HISTORY: I reviewed his family history in the paper chart, there is no pertinent history in this gentleman is 79 years old with known cardiac risk factors and coronary disease versus CABG.  ROS: A comprehensive Review of Systems - Negative except  pertinent positives noted above. General ROS:  positive for  - sleep disturbance due to dyspnea No recent episodes of orthostatic hypotension  PHYSICAL EXAM BP 118/80  Pulse 74  Ht 6\' 3"  (1.905 m)  Wt 207 lb 4.8 oz (94.031 kg)  BMI 25.91 kg/m2  SpO2 97% General appearance: Alert and oriented x3  He answers questions appropriately. He does not appear to be in acute distress today. Relatively healthy appearing and well-nourished/well-groomed.  Neck: Supple, no adenopathy, no carotid bruit or JVD Lungs: CTA B., normal percussion bilaterally and no wheezes rales or rhonchi. Heart: regular rate and rhythm, S1, S2 normal, no murmur, click, rub or gallop, normal apical impulse and Occasional ectopy Abdomen: soft, non-tender; bowel sounds normal; no masses,  no organomegaly Extremities: extremities normal, atraumatic, no cyanosis or edema, no edema, Pulses: 2+ and symmetric Neurologic: Alert and oriented X 3, normal strength and tone. Normal symmetric reflexes. Normal coordination and gait is somewhat unsteady, as he uses a cane for support.  CN II through XII grossly intact  WUJ:[id number] today: Yes Rate: 74 , Rhythm: Sinus rhythm with PACs.  Otherwise essentially normal ECG with no ischemic changes besides nonspecific ST-T changes  Recent Labs: CBC showed a stable hemoglobin of 12.3.; UA showed large 3+ leukocytosis with 4+ bacteria.  ASSESSMENT / PLAN: CAD S/P percutaneous coronary angioplasty - PCI of native RCA; Promus DES 3.0 mm x38 mm (3.6 mm) Status post PCI.  From the class III angina standpoint he seems to be much better. Unfortunately his post-PCI course his been somewhat marred with the hematuria and the dyspnea that he may very well be related to Brilinta. Thankfully he is not anemic from the hematuria. That seemed to be a UTI and is being treated and stable.  Plan: With his dyspnea and propensity to bleed, I would switch him from Brilinta to Plavix to avoid the dyspnea side effects of Brilinta. Continue aspirin for now, however if he were to start bleeding again I would want him to stop the aspirin temporarily.  Angina, class III Clearly the culprit is due to the occluded vein grafts. I think it is bright vein graft is basically close itself off now that the native right is worked on. He has a large diameter drug-eluting stent placed with brisk flow, I don't really see much likelihood of this stent closing down. We will continue to monitor however.  Plan: Continue beta blocker and Imdur along with statin and DAPT (now ASA plus Plavix)  CAD (coronary artery disease) of artery bypass graft, loss of VG to RCA and VG-Diag It would appear, that the vein grafts did not work all that well for him. Thankfully we do not need to intervene on her vein graft. I really think that working on it negative right would've been a very difficult and probably dangerous procedure. Thankfully his native right was amenable to PCI.  Hematuria Hopefully this was related to UTI. This will be be followed up with urology. Plavix being less potent and Brilinta, would hopefully help mitigate some of bleeding risk. His hemoglobin was stable.  Dyslipidemia, goal LDL below 70 He seems to be tolerating statin well. He does need to have lipids checked, but I wanted to get him over this current episode. Hopefully we can check it again when I see him in followup if it has not been checked by his PCP. In the past Dr. Kevan Ny was following his lipids.  Dyspnea Actually think that this  dyspnea sensation is related to  and unusual side effect of Brilinta.  He had the same sensation right after his PCI. No reason to suspect that there is restenosis of the stent this close out. Plan: Switch from Brilinta to Plavix   Orders Placed This Encounter  Procedures  . CBC  . EKG 12-Lead  . EKG 12-Lead    This order was created through External Result Entry   Meds ordered this encounter  Medications  . ciprofloxacin (CIPRO) 500 MG tablet    Sig: Take 500 mg by mouth 2 (two) times daily.  [name] DISCONTD: amLODipine (NORVASC) 5 MG tablet    Sig: Take 5 mg by mouth daily.  . clopidogrel (PLAVIX) 75 MG tablet    Sig: Take 1 tablet (75 mg total) by mouth daily.    Dispense:  90 tablet    Refill:  3    Followup: Roughly 3 months  Ashanti Ratti W. Herbie Baltimore, M.D., M.S. [facility] [address] [place], [place]  [address]  [phone] Pager # [phone]

## 2013-04-30 NOTE — Patient Instructions (Signed)
Please have lab---CBC  STOP BRILINTA AND START clopidogel (Plavix) one tablet daily   Your physician wants you to follow-up in 3 months Dr Herbie Baltimore.  You will receive a reminder letter in the mail two months in advance. If you don't receive a letter, please call our office to schedule the follow-up appointment.

## 2013-05-02 DIAGNOSIS — I10 Essential (primary) hypertension: Secondary | ICD-10-CM | POA: Insufficient documentation

## 2013-05-02 DIAGNOSIS — R319 Hematuria, unspecified: Secondary | ICD-10-CM | POA: Insufficient documentation

## 2013-05-02 HISTORY — DX: Hematuria, unspecified: R31.9

## 2013-05-02 HISTORY — DX: Essential (primary) hypertension: I10

## 2013-05-02 LAB — CBC
HCT: 36.5 % — ABNORMAL LOW (ref 39.0–52.0)
Hemoglobin: 12.6 g/dL — ABNORMAL LOW (ref 13.0–17.0)
MCH: 32.1 pg (ref 26.0–34.0)
MCHC: 34.5 g/dL (ref 30.0–36.0)
RDW: 13.5 % (ref 11.5–15.5)
WBC: 9 10*3/uL (ref 4.0–10.5)

## 2013-05-02 NOTE — Assessment & Plan Note (Signed)
He seems to be tolerating statin well. He does need to have lipids checked, but I wanted to get him over this current episode. Hopefully we can check it again when I see him in followup if it has not been checked by his PCP. In the past Dr. Kevan Ny was following his lipids.

## 2013-05-02 NOTE — Assessment & Plan Note (Signed)
Actually think that this dyspnea sensation is related to  and unusual side effect of Brilinta.  He had the same sensation right after his PCI. No reason to suspect that there is restenosis of the stent this close out. Plan: Switch from Brilinta to Plavix

## 2013-05-02 NOTE — Assessment & Plan Note (Signed)
Status post PCI. From the class III angina standpoint he seems to be much better. Unfortunately his post-PCI course his been somewhat marred with the hematuria and the dyspnea that he may very well be related to Brilinta. Thankfully he is not anemic from the hematuria. That seemed to be a UTI and is being treated and stable.  Plan: With his dyspnea and propensity to bleed, I would switch him from Brilinta to Plavix to avoid the dyspnea side effects of Brilinta. Continue aspirin for now, however if he were to start bleeding again I would want him to stop the aspirin temporarily.

## 2013-05-02 NOTE — Assessment & Plan Note (Signed)
It would appear, that the vein grafts did not work all that well for him. Thankfully we do not need to intervene on her vein graft. I really think that working on it negative right would've been a very difficult and probably dangerous procedure. Thankfully his native right was amenable to PCI.

## 2013-05-02 NOTE — Assessment & Plan Note (Signed)
Clearly the culprit is due to the occluded vein grafts. I think it is bright vein graft is basically close itself off now that the native right is worked on. He has a large diameter drug-eluting stent placed with brisk flow, I don't really see much likelihood of this stent closing down. We will continue to monitor however.  Plan: Continue beta blocker and Imdur along with statin and DAPT (now ASA plus Plavix)

## 2013-05-02 NOTE — Assessment & Plan Note (Signed)
Hopefully this was related to UTI. This will be be followed up with urology. Plavix being less potent and Brilinta, would hopefully help mitigate some of bleeding risk. His hemoglobin was stable.

## 2013-05-08 NOTE — Telephone Encounter (Signed)
Spoke to patient. Verbalized understanding. He wanted Dr Herbie Baltimore to know that he is still having problems breathing and fatigue.  Will defer to Dr Herbie Baltimore.

## 2013-05-08 NOTE — Telephone Encounter (Signed)
Message copied by Tobin Chad on Wed May 08, 2013  4:13 PM ------      Message from: [name]      Created: Thu May 02, 2013  8:08 PM       CBC looks stable.            [name], MD       ------

## 2013-05-13 DIAGNOSIS — J449 Chronic obstructive pulmonary disease, unspecified: Secondary | ICD-10-CM | POA: Diagnosis not present

## 2013-05-15 DIAGNOSIS — N401 Enlarged prostate with lower urinary tract symptoms: Secondary | ICD-10-CM | POA: Diagnosis not present

## 2013-05-15 DIAGNOSIS — R310 Gross hematuria: Secondary | ICD-10-CM | POA: Diagnosis not present

## 2013-05-15 DIAGNOSIS — N139 Obstructive and reflux uropathy, unspecified: Secondary | ICD-10-CM | POA: Diagnosis not present

## 2013-07-29 ENCOUNTER — Encounter: Payer: Self-pay | Admitting: Cardiology

## 2013-07-29 ENCOUNTER — Ambulatory Visit (INDEPENDENT_AMBULATORY_CARE_PROVIDER_SITE_OTHER): Payer: Medicare Other | Admitting: Cardiology

## 2013-07-29 VITALS — BP 128/70 | HR 70 | Ht 74.0 in | Wt 207.1 lb

## 2013-07-29 DIAGNOSIS — I209 Angina pectoris, unspecified: Secondary | ICD-10-CM | POA: Diagnosis not present

## 2013-07-29 DIAGNOSIS — Z951 Presence of aortocoronary bypass graft: Secondary | ICD-10-CM

## 2013-07-29 DIAGNOSIS — I25810 Atherosclerosis of coronary artery bypass graft(s) without angina pectoris: Secondary | ICD-10-CM | POA: Diagnosis not present

## 2013-07-29 DIAGNOSIS — I951 Orthostatic hypotension: Secondary | ICD-10-CM | POA: Diagnosis not present

## 2013-07-29 DIAGNOSIS — R0600 Dyspnea, unspecified: Secondary | ICD-10-CM

## 2013-07-29 DIAGNOSIS — I2510 Atherosclerotic heart disease of native coronary artery without angina pectoris: Secondary | ICD-10-CM | POA: Diagnosis not present

## 2013-07-29 DIAGNOSIS — E785 Hyperlipidemia, unspecified: Secondary | ICD-10-CM

## 2013-07-29 DIAGNOSIS — Z9861 Coronary angioplasty status: Secondary | ICD-10-CM

## 2013-07-29 DIAGNOSIS — R0989 Other specified symptoms and signs involving the circulatory and respiratory systems: Secondary | ICD-10-CM

## 2013-07-29 DIAGNOSIS — R0609 Other forms of dyspnea: Secondary | ICD-10-CM

## 2013-07-29 NOTE — Progress Notes (Signed)
PCP: Pearla Dubonnet, MD  Clinic Note: Chief Complaint  Patient presents with  . ROV 3 months    C/o shortness of breath-all the time-PCP gave inhalers, doesn't seem to be working, and lightheadedness/dizziness all the time.    HPI: Joshua Chambers is a 80 y.o. male with a PMH below who presents today for urgent walk-in visit to evaluate breathlessness, weakness and fatigue. He is a very pleasant gentleman who I been following for a few years now.  He had a history of coronary disease with CABG in 2007.  Mostly what I been treating for is his orthostatic hypotension up until this past October when he was noticing some significant exertional angina. He is taken to cardiac catheterization and found to have occluded vein graft to the diagonal and OM with near occlusion of the vein graft to the RCA.  The LIMA was patent. After discussing it with colleagues and we decided that the best option was to proceed with PCI of the native RCA and not attempt intervention on the vein graft. The mid RCA was treated in a long segment with a 3.0 mm x 38 mm Promus Premier DES postdilated up to 3.6 mm. He was discharged on Brilinta plus aspirin, but was converted to Plavix alone after hematuria related to UTI and gasping dyspnea thought to be related to Brilinta.   Interval History: Since his last visit in October, he has not had anymore that chest pressure sensation. His major concern is balance. He says that he has to walk with a cane now is otherwise he feels like he is tilting to the left. He doesn't really describe the chest discomfort but does note that he is a little more short of breath than he had been. His primary is tried him on some inhalers that made it a little better. Interestingly the dyspnea is better when he is lying flat than when he sits up. It usually occurs more at rest. He says his legs ache some a night and somewhat cramp but don't bother him too much. He denies any PND, orthopnea or any  significant edema. No lightheadedness, syncope or near syncope. He does have the dizziness is not really vertiginous in nature. He denies any TIA or RCA symptoms. No further episodes of hematuria. No melena or hematochezia.  Past Medical History  Diagnosis Date  . CAD (coronary artery disease), native coronary artery 2007    Multivessel CAD referred for CABG  . S/P CABG x 3 2007    LIMA-LAD, SVG-DIAG, SVG-RCA  . History of nuclear stress test April 2009    6 min, 10 METS, diaphragmatic attenuation, no ischemia.  . Abnormal echocardiogram 2011    EF 45-50%, mild HK of anterior septum.  [name] History of stroke 2011    Possible TIA in 2001  . Hx of orthostatic hypotension 2009    Positive tilt table:  dizziness/vasopressor type; previously on the decision   . Dyslipidemia, goal LDL below 70   . Hx SBO   . History of DVT of lower extremity     Right popliteal vein  . Nephrolithiasis   . Arthritis   . CAD S/P percutaneous coronary angioplasty 04/19/2013    PCI of Native RCA - Promus Premier DES 3.0 mm x 38 mm (3.6 mm)  . CAD (coronary artery disease), autologous vein bypass graft 04/19/2013    100% flush occlusion of SVG-OM & SVG-Diag; near Subtotal Occlusion of SVG-RCA (PCI of RCA done to avoid distal  embolization with PCI to SVG)    Prior Cardiac Evaluation and Past Surgical History: Past Surgical History  Procedure Laterality Date  . Coronary artery bypass graft  2007    LIMA-LAD, SVG-D1, SVG-RCA  . Appendectomy    . Tonsillectomy    . Colon surgery    . Coronary angioplasty with stent placement  04/19/13    Stent-Promus DES (3.0 mm x 38 mm - 3.6 mm) to native RCA prox-mid, found closure of VG to RCA,  closed graft to diag, patent LIMA    Allergies  Allergen Reactions  . Amiodarone Other (See Comments)    Extreme dehydration  . Diltiazem Other (See Comments)    Pt unsure of reaction  . Sulfa Antibiotics Other (See Comments)    Pt unsure of reaction    Current Outpatient  Prescriptions  Medication Sig Dispense Refill  . atorvastatin (LIPITOR) 40 MG tablet Take 40 mg by mouth at bedtime.       . clopidogrel (PLAVIX) 75 MG tablet Take 1 tablet (75 mg total) by mouth daily.  90 tablet  3  . dutasteride (AVODART) 0.5 MG capsule Take 0.5 mg by mouth at bedtime.      . isosorbide mononitrate (IMDUR) 30 MG 24 hr tablet Take 0.5 tablets (15 mg total) by mouth daily.  30 tablet  5  . metoprolol tartrate (LOPRESSOR) 25 MG tablet Take 0.5 tablets (12.5 mg total) by mouth 2 (two) times daily.  30 tablet  6  . midodrine (PROAMATINE) 10 MG tablet Take 1 tablet (10 mg total) by mouth 3 (three) times daily with meals.  90 tablet  5  . nitroGLYCERIN (NITROSTAT) 0.4 MG SL tablet Place 1 tablet (0.4 mg total) under the tongue every 5 (five) minutes as needed for chest pain.  25 tablet  3  . Polyvinyl Alcohol-Povidone (REFRESH OP) Place 1 drop into both eyes daily as needed.      . ranitidine (ZANTAC) 150 MG tablet Take 150 mg by mouth 2 (two) times daily as needed for heartburn.        No current facility-administered medications for this visit.    History   Social History Narrative    He is now remarried for the last 2 years, his first wife of 60 years is deceased. He has 1   child from that first marriage and 2 grandchildren.    He does not smoke, does not drink.    He is not very active but he does kind of meander around with his new wife who is permanently disabled.    ROS: A comprehensive Review of Systems - Negative except  pertinent positives noted above.  PHYSICAL EXAM BP 128/70  Pulse 70  Ht 6\' 2"  (1.88 m)  Wt 207 lb 1.6 oz (93.94 kg)  BMI 26.58 kg/m2 General appearance: Alert and oriented x3  He answers questions appropriately. He does not appear to be in acute distress today. Relatively healthy appearing and well-nourished/well-groomed.  Neck: Supple, no adenopathy, no carotid bruit or JVD Lungs: CTA B., normal percussion bilaterally and no wheezes rales or  rhonchi. Heart: regular rate and rhythm, S1, S2 normal, no murmur, click, rub or gallop, normal apical impulse and Occasional ectopy Abdomen: soft, non-tender; bowel sounds normal; no masses,  no organomegaly Extremities: extremities normal, atraumatic, no cyanosis or edema, no edema, Pulses: 2+ and symmetric Neurologic: Alert and oriented X 3, normal strength and tone. Normal symmetric reflexes. Normal coordination and gait is somewhat unsteady, as he uses  a cane for support.  CN II through XII grossly intact  SAY:[id number] today: Yes Rate: 70 , Rhythm: Sinus rhythm with PACs.   Nnspecific ST-T changes, Otherwise essentially normal ECG  Recent Labs:  none recently.  ASSESSMENT / PLAN: Angina, class III No further episodes of angina following PCI.  CAD S/P percutaneous coronary angioplasty - PCI of native RCA; Promus DES 3.0 mm x38 mm (3.6 mm)  Currently stabe on Plavix alone. He is also taking a statin and beta blocker along with a nitrate. The benefit of Plavix is less potency with less bleeding risk as well as the lack of the gasping dyspneic symptoms he had the Brilinta. We will continue with aspirin since his bleeding has stopped since this change.  CAD (coronary artery disease) of artery bypass graft, loss of VG to RCA and VG-Diag He now basically does not have any vein grafts left. He simply has the LIMA. The circumflex and diagonal native vessels were not really PCI amenable, however the RCA should do well with a relatively large stent.  Orthostatic hypotension He does have occasionally have orthostatic hypotension symptoms that much now, it's mostly this balance issue. I think we can back off on his Midrin he did so back off to 5 3 times a day incentive 10 3 times a day to try this for a week to see how this does. Things at work up for a long is if his dyspnea better, be it is is he having recurrence of orthostatic hypotension.  Dyslipidemia, goal LDL below 70 He is not be a dose of  statin, he is due for lipid checked by his PCP in February for his yearly physical.  Dyspnea The and dyspnea has improved. He now has this strange rest dyspnea with lying flat. This would lead [place] to believe this is probably not a heart related issue. I recommended that he continue using inhalers as given by his PCP. He also acknowledges that the exertional dyspnea he sometimes complains about is probably more just due to age related deconditioning and his frustration he is unable to do the things he used to do.   Orders Placed This Encounter  Procedures  . EKG 12-Lead   No orders of the defined types were placed in this encounter.    Followup: Roughly  6  months  DAVID W. Herbie Baltimore, M.D., M.S. [facility] [address] [place], [place]  [address]  [phone] Pager # [phone]

## 2013-07-29 NOTE — Patient Instructions (Addendum)
Try taking 1/2 tablet 3 times day of MIDODRINE FOR one week   Your physician wants you to follow-up in 6 month Dr Herbie Baltimore. You will receive a reminder letter in the mail two months in advance. If you don't receive a letter, please call our office to schedule the follow-up appointment.

## 2013-08-01 NOTE — Assessment & Plan Note (Signed)
He now basically does not have any vein grafts left. He simply has the LIMA. The circumflex and diagonal native vessels were not really PCI amenable, however the RCA should do well with a relatively large stent.

## 2013-08-01 NOTE — Assessment & Plan Note (Signed)
Currently stabe on Plavix alone. He is also taking a statin and beta blocker along with a nitrate. The benefit of Plavix is less potency with less bleeding risk as well as the lack of the gasping dyspneic symptoms he had the Brilinta. We will continue with aspirin since his bleeding has stopped since this change.

## 2013-08-01 NOTE — Assessment & Plan Note (Signed)
No further episodes of angina following PCI.

## 2013-08-01 NOTE — Assessment & Plan Note (Addendum)
The and dyspnea has improved. He now has this strange rest dyspnea with lying flat. This would lead [place] to believe this is probably not a heart related issue. I recommended that he continue using inhalers as given by his PCP. He also acknowledges that the exertional dyspnea he sometimes complains about is probably more just due to age related deconditioning and his frustration he is unable to do the things he used to do.

## 2013-08-01 NOTE — Assessment & Plan Note (Signed)
He is not be a dose of statin, he is due for lipid checked by his PCP in February for his yearly physical.

## 2013-08-01 NOTE — Assessment & Plan Note (Signed)
He does have occasionally have orthostatic hypotension symptoms that much now, it's mostly this balance issue. I think we can back off on his Midrin he did so back off to 5 3 times a day incentive 10 3 times a day to try this for a week to see how this does. Things at work up for a long is if his dyspnea better, be it is is he having recurrence of orthostatic hypotension.

## 2013-08-13 ENCOUNTER — Other Ambulatory Visit: Payer: Self-pay | Admitting: Cardiology

## 2013-08-13 NOTE — Telephone Encounter (Signed)
Rx was sent to pharmacy electronically. 

## 2013-08-14 ENCOUNTER — Other Ambulatory Visit: Payer: Self-pay | Admitting: Cardiology

## 2013-08-22 DIAGNOSIS — Z1331 Encounter for screening for depression: Secondary | ICD-10-CM | POA: Diagnosis not present

## 2013-08-22 DIAGNOSIS — R0602 Shortness of breath: Secondary | ICD-10-CM | POA: Diagnosis not present

## 2013-08-22 DIAGNOSIS — E782 Mixed hyperlipidemia: Secondary | ICD-10-CM | POA: Diagnosis not present

## 2013-08-22 DIAGNOSIS — M069 Rheumatoid arthritis, unspecified: Secondary | ICD-10-CM | POA: Diagnosis not present

## 2013-08-22 DIAGNOSIS — R3129 Other microscopic hematuria: Secondary | ICD-10-CM | POA: Diagnosis not present

## 2013-08-22 DIAGNOSIS — R42 Dizziness and giddiness: Secondary | ICD-10-CM | POA: Diagnosis not present

## 2013-08-22 DIAGNOSIS — Z0000 Encounter for general adult medical examination without abnormal findings: Secondary | ICD-10-CM | POA: Diagnosis not present

## 2013-08-22 DIAGNOSIS — Z79899 Other long term (current) drug therapy: Secondary | ICD-10-CM | POA: Diagnosis not present

## 2013-08-22 DIAGNOSIS — D509 Iron deficiency anemia, unspecified: Secondary | ICD-10-CM | POA: Diagnosis not present

## 2013-08-22 DIAGNOSIS — I2510 Atherosclerotic heart disease of native coronary artery without angina pectoris: Secondary | ICD-10-CM | POA: Diagnosis not present

## 2013-11-06 ENCOUNTER — Other Ambulatory Visit: Payer: Self-pay | Admitting: Cardiology

## 2013-11-06 NOTE — Telephone Encounter (Signed)
Rx was sent to pharmacy electronically. 

## 2013-11-07 ENCOUNTER — Other Ambulatory Visit: Payer: Self-pay | Admitting: Cardiology

## 2013-11-07 NOTE — Telephone Encounter (Signed)
Rx was sent to pharmacy electronically. 

## 2013-12-19 DIAGNOSIS — H353190 Nonexudative age-related macular degeneration, unspecified eye, stage unspecified: Secondary | ICD-10-CM | POA: Diagnosis not present

## 2013-12-19 DIAGNOSIS — H04129 Dry eye syndrome of unspecified lacrimal gland: Secondary | ICD-10-CM | POA: Diagnosis not present

## 2013-12-19 DIAGNOSIS — H26499 Other secondary cataract, unspecified eye: Secondary | ICD-10-CM | POA: Diagnosis not present

## 2013-12-27 DIAGNOSIS — H8110 Benign paroxysmal vertigo, unspecified ear: Secondary | ICD-10-CM | POA: Diagnosis not present

## 2013-12-27 DIAGNOSIS — J449 Chronic obstructive pulmonary disease, unspecified: Secondary | ICD-10-CM | POA: Diagnosis not present

## 2013-12-27 DIAGNOSIS — M25659 Stiffness of unspecified hip, not elsewhere classified: Secondary | ICD-10-CM | POA: Diagnosis not present

## 2014-01-03 ENCOUNTER — Other Ambulatory Visit (HOSPITAL_COMMUNITY): Payer: Self-pay | Admitting: Physician Assistant

## 2014-01-03 NOTE — Telephone Encounter (Signed)
Rx was sent to pharmacy electronically. 

## 2014-01-27 ENCOUNTER — Ambulatory Visit (INDEPENDENT_AMBULATORY_CARE_PROVIDER_SITE_OTHER): Payer: Medicare Other | Admitting: Cardiology

## 2014-01-27 VITALS — BP 132/72 | HR 53 | Ht 75.0 in | Wt 213.1 lb

## 2014-01-27 DIAGNOSIS — I209 Angina pectoris, unspecified: Secondary | ICD-10-CM

## 2014-01-27 DIAGNOSIS — I25810 Atherosclerosis of coronary artery bypass graft(s) without angina pectoris: Secondary | ICD-10-CM

## 2014-01-27 DIAGNOSIS — Z9861 Coronary angioplasty status: Secondary | ICD-10-CM

## 2014-01-27 DIAGNOSIS — I951 Orthostatic hypotension: Secondary | ICD-10-CM

## 2014-01-27 DIAGNOSIS — I10 Essential (primary) hypertension: Secondary | ICD-10-CM | POA: Diagnosis not present

## 2014-01-27 DIAGNOSIS — I2510 Atherosclerotic heart disease of native coronary artery without angina pectoris: Secondary | ICD-10-CM | POA: Diagnosis not present

## 2014-01-27 DIAGNOSIS — E785 Hyperlipidemia, unspecified: Secondary | ICD-10-CM

## 2014-01-27 NOTE — Patient Instructions (Signed)
IF YOU GOING TO WALK FOR A DISTANCE - USE YOUR WALKER RATHER THAN YOUR CANE.  CONTINUE CURRENT MEDICATIONS.  Your physician wants you to follow-up in 6 MONTH Dr Herbie Baltimore 30 MIN APPT.  You will receive a reminder letter in the mail two months in advance. If you don't receive a letter, please call our office to schedule the follow-up appointment.

## 2014-02-01 ENCOUNTER — Encounter: Payer: Self-pay | Admitting: Cardiology

## 2014-02-01 NOTE — Assessment & Plan Note (Signed)
Labs are followed by PCP. He is currently on a statin.

## 2014-02-01 NOTE — Assessment & Plan Note (Signed)
Relatively stable blood pressure on current regimen. Would avoid aggressive blood pressure control due to history of orthostatic hypotension

## 2014-02-01 NOTE — Assessment & Plan Note (Signed)
No active visual symptoms. Able to mow his lawn at age 80 which is quite impressive. He remains on Plavix with no aspirin, atorvastatin, metoprolol and Imdur. Due to history of orthostatic hypotension not on ACE inhibitor/ARB.

## 2014-02-01 NOTE — Progress Notes (Signed)
PCP: GATES,ROBERT NEVILL, MD  Clinic Note: Chief Complaint  Patient presents with  . 6 month visit    no chest pain , pain legs, sob , no edema, problem  when he sits he goes to sleep    HPI: Joshua Chambers is a 80 y.o. male with a PMH below who presents today for ~6 month follow-up.  He is a very pleasant gentleman who I been following for a few years now.  He had a history of Multivessel CAD s/p CABG in 2007 with PCI of the Native RCA due to near occlusion of extensively disease SVG-RCA.  A long segment was treated with a single Promus Premier DES 3.0 mm x 38 mm (post-dilated to 3.6 mm).  Initially on Brilinta, he was converted to Plavix due to "gasping dsypena" intolerance.  I saw him in January for exertional dyspnea -- with notable improvement in response to Inhales/Nebulizers.    Interval History: Since his last visit, he has not had anymore that chest pressure sensation with rest or exertion, and really is not noticing any dyspnea. He is a the lawn yesterday for about half an hour without stopping. He denied any chest tightness or pressure or dyspnea. He does still sometimes get exertional dyspnea if he overdoes it. He continues to complain of his main issue being poor balance. He is using a cane and feels as though he tilts to the left. He has not had any falls or any dizziness or lightheadedness associated with it. He denies any TIA/sclerosis fugax or syncope/near-syncope symptoms. The other thing he notes is that he is sleeping a lot more than usual. Can take up to one or 2 hour nap in the afternoon.  Interestingly the dyspnea is better when he is lying flat than when he sits up. It usually occurs more at rest. He says his legs ache some a night and somewhat cramp but don't bother him too much. He denies any PND, orthopnea or any significant edema. No lightheadedness, syncope or near syncope. He does have the dizziness is not really vertiginous in nature. He denies any hematuria,  melena, hematochezia, or epistaxis. No claudication.  Past Medical History  Diagnosis Date  . CAD (coronary artery disease), native coronary artery 2007    Multivessel CAD referred for CABG  . S/P CABG x 3 2007    LIMA-LAD, SVG-DIAG, SVG-RCA  . CAD (coronary artery disease), autologous vein bypass graft 04/19/2013    100% flush occlusion of SVG-OM & SVG-Diag; near Subtotal Occlusion of SVG-RCA (PCI of RCA done to avoid distal embolization with PCI to SVG)  . CAD S/P percutaneous coronary angioplasty  04/19/2013    PCI of Native RCA - Promus Premier DES 3.0 mm x 38 mm (3.6 mm);   [name] History of stroke August 2011    Possible TIA in 2001;  Echo: EF 45-50%, mild HK of anterior septum.  [name] Hx of orthostatic hypotension 2009    Positive tilt table:  dizziness/vasopressor type; previously on the decision   . Dyslipidemia, goal LDL below 70   . Hx SBO   . History of DVT of lower extremity     Right popliteal vein  . Nephrolithiasis   . Arthritis   . Unsteady gait     Prior Cardiac Evaluation and Past Surgical History: Past Surgical History  Procedure Laterality Date  . Coronary artery bypass graft  2007    LIMA-LAD, SVG-D1, SVG-RCA  . Appendectomy    . Tonsillectomy    .  Colon surgery    . Coronary angioplasty with stent placement  04/19/13    Stent-Promus DES (3.0 mm x 38 mm - 3.6 mm) to native RCA prox-mid, found closure of VG to RCA,  closed graft to diag, patent LIMA    Allergies  Allergen Reactions  . Amiodarone Other (See Comments)    Extreme dehydration  . Diltiazem Other (See Comments)    Pt unsure of reaction  . Sulfa Antibiotics Other (See Comments)    Pt unsure of reaction    Current Outpatient Prescriptions  Medication Sig Dispense Refill  . atorvastatin (LIPITOR) 40 MG tablet TAKE 1 TABLET DAILY AT     BEDTIME  90 tablet  3  . clopidogrel (PLAVIX) 75 MG tablet Take 1 tablet (75 mg total) by mouth daily.  90 tablet  3  . dutasteride (AVODART) 0.5 MG capsule Take  0.5 mg by mouth at bedtime.      . isosorbide mononitrate (IMDUR) 30 MG 24 hr tablet Take 0.5 tablets (15 mg total) by mouth daily.  30 tablet  4  . metoprolol tartrate (LOPRESSOR) 25 MG tablet TAKE 1/2 TABLET BY MOUTH TWICE DAILY  30 tablet  6  . midodrine (PROAMATINE) 10 MG tablet TAKE 1 TABLET BY MOUTH THREE TIMES DAILY WITH MEALS  90 tablet  5  . nitroGLYCERIN (NITROSTAT) 0.4 MG SL tablet Place 1 tablet (0.4 mg total) under the tongue every 5 (five) minutes as needed for chest pain.  25 tablet  3  . Polyvinyl Alcohol-Povidone (REFRESH OP) Place 1 drop into both eyes daily as needed.      . ranitidine (ZANTAC) 150 MG tablet Take 150 mg by mouth 2 (two) times daily as needed for heartburn.        No current facility-administered medications for this visit.   No changes in Social or Family History  ROS: A comprehensive Review of Systems - Negative except  pertinent positives noted above.  PHYSICAL EXAM BP 132/72  Pulse 53  Ht 6\' 3"  (1.905 m)  Wt 213 lb 1.6 oz (96.662 kg)  BMI 26.64 kg/m2 General appearance: Alert and oriented x3  He answers questions appropriately. He does not appear to be in acute distress today. Relatively healthy appearing and well-nourished/well-groomed.  Neck: Supple, no adenopathy, no carotid bruit or JVD Lungs: CTA B., normal percussion bilaterally and no wheezes rales or rhonchi. Heart: regular rate and rhythm, S1, S2 normal, no murmur, click, rub or gallop, normal apical impulse and Occasional ectopy Abdomen: soft, non-tender; bowel sounds normal; no masses,  no organomegaly Extremities: extremities normal, atraumatic, no cyanosis or edema, no edema, Pulses: 2+ and symmetric Neurologic: Alert and oriented X 3, normal strength and tone. Normal symmetric reflexes. Normal coordination and gait is somewhat unsteady, as he uses a cane for support.  CN II through XII grossly intact  today: Yes Rate: 70 , Rhythm: Sinus rhythm with PACs.   Nnspecific ST-T  changes, Otherwise essentially normal ECG  Recent Labs:  none recently.  ASSESSMENT / PLAN: CAD S/P percutaneous coronary angioplasty - PCI of native RCA; Promus DES 3.0 mm x38 mm (3.6 mm) No active visual symptoms. Able to mow his lawn at age 47 which is quite impressive. He remains on Plavix with no aspirin, atorvastatin, metoprolol and Imdur. Due to history of orthostatic hypotension not on ACE inhibitor/ARB.  Coronary artery disease involving coronary bypass graft of native heart without angina pectoris; CAD (coronary artery disease) of artery bypass graft:  loss of  VG to RCA and VG-Diag Loss of all vein grafts. RCA was PCI amenable and treated with large stents. Now perfused by stented RCA and LIMA-LAD as the circumflex and diagonal vessels were not amenable to PCI.  He is relatively preserved EF of 45-50% but no signs or symptoms of heart failure.  Essential hypertension Relatively stable blood pressure on current regimen. Would avoid aggressive blood pressure control due to history of orthostatic hypotension  Orthostatic hypotension Taking baseline dose of ProAmatine. I don't think this has anything to do with his unsteady gait. Allow for permissive hypertension, therefore not on ACE inhibitor/ARB or calcium channel blocker.  Dyslipidemia, goal LDL below 70 Labs are followed by PCP. He is currently on a statin.   Orders Placed This Encounter  Procedures  . EKG 12-Lead   No orders of the defined types were placed in this encounter.    Followup: Roughly  6  months  Deeanne Deininger W. Herbie Baltimore, M.D., M.S. [facility] [address] [place], [place]  [address]  [phone] Pager # [phone]

## 2014-02-01 NOTE — Assessment & Plan Note (Signed)
Loss of all vein grafts. RCA was PCI amenable and treated with large stents. Now perfused by stented RCA and LIMA-LAD as the circumflex and diagonal vessels were not amenable to PCI.  He is relatively preserved EF of 45-50% but no signs or symptoms of heart failure.

## 2014-02-01 NOTE — Assessment & Plan Note (Addendum)
Taking baseline dose of ProAmatine. I don't think this has anything to do with his unsteady gait. Allow for permissive hypertension, therefore not on ACE inhibitor/ARB or calcium channel blocker.

## 2014-02-12 DIAGNOSIS — J449 Chronic obstructive pulmonary disease, unspecified: Secondary | ICD-10-CM | POA: Diagnosis not present

## 2014-02-12 DIAGNOSIS — M25659 Stiffness of unspecified hip, not elsewhere classified: Secondary | ICD-10-CM | POA: Diagnosis not present

## 2014-02-12 DIAGNOSIS — R5383 Other fatigue: Secondary | ICD-10-CM | POA: Diagnosis not present

## 2014-02-12 DIAGNOSIS — I2510 Atherosclerotic heart disease of native coronary artery without angina pectoris: Secondary | ICD-10-CM | POA: Diagnosis not present

## 2014-02-12 DIAGNOSIS — R5381 Other malaise: Secondary | ICD-10-CM | POA: Diagnosis not present

## 2014-02-12 DIAGNOSIS — I951 Orthostatic hypotension: Secondary | ICD-10-CM | POA: Diagnosis not present

## 2014-02-12 DIAGNOSIS — R42 Dizziness and giddiness: Secondary | ICD-10-CM | POA: Diagnosis not present

## 2014-02-12 DIAGNOSIS — N400 Enlarged prostate without lower urinary tract symptoms: Secondary | ICD-10-CM | POA: Diagnosis not present

## 2014-03-19 DIAGNOSIS — Z23 Encounter for immunization: Secondary | ICD-10-CM | POA: Diagnosis not present

## 2014-03-19 DIAGNOSIS — I951 Orthostatic hypotension: Secondary | ICD-10-CM | POA: Diagnosis not present

## 2014-03-19 DIAGNOSIS — H8110 Benign paroxysmal vertigo, unspecified ear: Secondary | ICD-10-CM | POA: Diagnosis not present

## 2014-03-19 DIAGNOSIS — R5381 Other malaise: Secondary | ICD-10-CM | POA: Diagnosis not present

## 2014-03-19 DIAGNOSIS — I2510 Atherosclerotic heart disease of native coronary artery without angina pectoris: Secondary | ICD-10-CM | POA: Diagnosis not present

## 2014-03-19 DIAGNOSIS — N400 Enlarged prostate without lower urinary tract symptoms: Secondary | ICD-10-CM | POA: Diagnosis not present

## 2014-03-19 DIAGNOSIS — J449 Chronic obstructive pulmonary disease, unspecified: Secondary | ICD-10-CM | POA: Diagnosis not present

## 2014-03-19 DIAGNOSIS — R5383 Other fatigue: Secondary | ICD-10-CM | POA: Diagnosis not present

## 2014-03-19 DIAGNOSIS — R42 Dizziness and giddiness: Secondary | ICD-10-CM | POA: Diagnosis not present

## 2014-03-19 DIAGNOSIS — I499 Cardiac arrhythmia, unspecified: Secondary | ICD-10-CM | POA: Diagnosis not present

## 2014-03-31 DIAGNOSIS — H8110 Benign paroxysmal vertigo, unspecified ear: Secondary | ICD-10-CM | POA: Diagnosis not present

## 2014-03-31 DIAGNOSIS — M25676 Stiffness of unspecified foot, not elsewhere classified: Secondary | ICD-10-CM | POA: Diagnosis not present

## 2014-03-31 DIAGNOSIS — R269 Unspecified abnormalities of gait and mobility: Secondary | ICD-10-CM | POA: Diagnosis not present

## 2014-03-31 DIAGNOSIS — M6281 Muscle weakness (generalized): Secondary | ICD-10-CM | POA: Diagnosis not present

## 2014-03-31 DIAGNOSIS — M25673 Stiffness of unspecified ankle, not elsewhere classified: Secondary | ICD-10-CM | POA: Diagnosis not present

## 2014-04-04 DIAGNOSIS — M25673 Stiffness of unspecified ankle, not elsewhere classified: Secondary | ICD-10-CM | POA: Diagnosis not present

## 2014-04-04 DIAGNOSIS — R269 Unspecified abnormalities of gait and mobility: Secondary | ICD-10-CM | POA: Diagnosis not present

## 2014-04-04 DIAGNOSIS — M6281 Muscle weakness (generalized): Secondary | ICD-10-CM | POA: Diagnosis not present

## 2014-04-04 DIAGNOSIS — H8110 Benign paroxysmal vertigo, unspecified ear: Secondary | ICD-10-CM | POA: Diagnosis not present

## 2014-04-04 DIAGNOSIS — M25676 Stiffness of unspecified foot, not elsewhere classified: Secondary | ICD-10-CM | POA: Diagnosis not present

## 2014-04-08 DIAGNOSIS — M6281 Muscle weakness (generalized): Secondary | ICD-10-CM | POA: Diagnosis not present

## 2014-04-08 DIAGNOSIS — H8110 Benign paroxysmal vertigo, unspecified ear: Secondary | ICD-10-CM | POA: Diagnosis not present

## 2014-04-08 DIAGNOSIS — M25676 Stiffness of unspecified foot, not elsewhere classified: Secondary | ICD-10-CM | POA: Diagnosis not present

## 2014-04-08 DIAGNOSIS — R269 Unspecified abnormalities of gait and mobility: Secondary | ICD-10-CM | POA: Diagnosis not present

## 2014-04-08 DIAGNOSIS — M25673 Stiffness of unspecified ankle, not elsewhere classified: Secondary | ICD-10-CM | POA: Diagnosis not present

## 2014-04-10 DIAGNOSIS — H8110 Benign paroxysmal vertigo, unspecified ear: Secondary | ICD-10-CM | POA: Diagnosis not present

## 2014-04-10 DIAGNOSIS — M25673 Stiffness of unspecified ankle, not elsewhere classified: Secondary | ICD-10-CM | POA: Diagnosis not present

## 2014-04-10 DIAGNOSIS — M6281 Muscle weakness (generalized): Secondary | ICD-10-CM | POA: Diagnosis not present

## 2014-04-10 DIAGNOSIS — M25676 Stiffness of unspecified foot, not elsewhere classified: Secondary | ICD-10-CM | POA: Diagnosis not present

## 2014-04-10 DIAGNOSIS — R269 Unspecified abnormalities of gait and mobility: Secondary | ICD-10-CM | POA: Diagnosis not present

## 2014-04-13 ENCOUNTER — Other Ambulatory Visit: Payer: Self-pay | Admitting: Cardiology

## 2014-04-14 NOTE — Telephone Encounter (Signed)
Rx was sent to pharmacy electronically. 

## 2014-04-30 DIAGNOSIS — R269 Unspecified abnormalities of gait and mobility: Secondary | ICD-10-CM | POA: Diagnosis not present

## 2014-04-30 DIAGNOSIS — M25673 Stiffness of unspecified ankle, not elsewhere classified: Secondary | ICD-10-CM | POA: Diagnosis not present

## 2014-04-30 DIAGNOSIS — H8113 Benign paroxysmal vertigo, bilateral: Secondary | ICD-10-CM | POA: Diagnosis not present

## 2014-04-30 DIAGNOSIS — M6281 Muscle weakness (generalized): Secondary | ICD-10-CM | POA: Diagnosis not present

## 2014-05-02 ENCOUNTER — Other Ambulatory Visit (HOSPITAL_COMMUNITY): Payer: Self-pay | Admitting: Physician Assistant

## 2014-05-02 NOTE — Telephone Encounter (Signed)
Rx was sent to pharmacy electronically. 

## 2014-05-14 DIAGNOSIS — H8113 Benign paroxysmal vertigo, bilateral: Secondary | ICD-10-CM | POA: Diagnosis not present

## 2014-05-14 DIAGNOSIS — M25673 Stiffness of unspecified ankle, not elsewhere classified: Secondary | ICD-10-CM | POA: Diagnosis not present

## 2014-05-14 DIAGNOSIS — M6281 Muscle weakness (generalized): Secondary | ICD-10-CM | POA: Diagnosis not present

## 2014-05-14 DIAGNOSIS — R269 Unspecified abnormalities of gait and mobility: Secondary | ICD-10-CM | POA: Diagnosis not present

## 2014-05-16 DIAGNOSIS — N401 Enlarged prostate with lower urinary tract symptoms: Secondary | ICD-10-CM | POA: Diagnosis not present

## 2014-05-16 DIAGNOSIS — R310 Gross hematuria: Secondary | ICD-10-CM | POA: Diagnosis not present

## 2014-05-16 DIAGNOSIS — R350 Frequency of micturition: Secondary | ICD-10-CM | POA: Diagnosis not present

## 2014-05-16 DIAGNOSIS — R3913 Splitting of urinary stream: Secondary | ICD-10-CM | POA: Diagnosis not present

## 2014-05-16 DIAGNOSIS — R351 Nocturia: Secondary | ICD-10-CM | POA: Diagnosis not present

## 2014-05-20 ENCOUNTER — Telehealth: Payer: Self-pay | Admitting: Cardiology

## 2014-05-20 NOTE — Telephone Encounter (Signed)
Received 4 pages of records from [facility] (Dr Su Grand) for patients appointment on 07/31/14 with Dr Herbie Baltimore.  Records given to N. Hines for Dr Elissa Hefty schedule on 07/31/13  lp

## 2014-05-23 DIAGNOSIS — M25673 Stiffness of unspecified ankle, not elsewhere classified: Secondary | ICD-10-CM | POA: Diagnosis not present

## 2014-05-23 DIAGNOSIS — R269 Unspecified abnormalities of gait and mobility: Secondary | ICD-10-CM | POA: Diagnosis not present

## 2014-05-23 DIAGNOSIS — H8113 Benign paroxysmal vertigo, bilateral: Secondary | ICD-10-CM | POA: Diagnosis not present

## 2014-05-23 DIAGNOSIS — M6281 Muscle weakness (generalized): Secondary | ICD-10-CM | POA: Diagnosis not present

## 2014-05-30 DIAGNOSIS — R269 Unspecified abnormalities of gait and mobility: Secondary | ICD-10-CM | POA: Diagnosis not present

## 2014-05-30 DIAGNOSIS — H8113 Benign paroxysmal vertigo, bilateral: Secondary | ICD-10-CM | POA: Diagnosis not present

## 2014-05-30 DIAGNOSIS — M25673 Stiffness of unspecified ankle, not elsewhere classified: Secondary | ICD-10-CM | POA: Diagnosis not present

## 2014-05-30 DIAGNOSIS — M6281 Muscle weakness (generalized): Secondary | ICD-10-CM | POA: Diagnosis not present

## 2014-06-04 DIAGNOSIS — M25673 Stiffness of unspecified ankle, not elsewhere classified: Secondary | ICD-10-CM | POA: Diagnosis not present

## 2014-06-04 DIAGNOSIS — R269 Unspecified abnormalities of gait and mobility: Secondary | ICD-10-CM | POA: Diagnosis not present

## 2014-06-04 DIAGNOSIS — M6281 Muscle weakness (generalized): Secondary | ICD-10-CM | POA: Diagnosis not present

## 2014-06-04 DIAGNOSIS — H8113 Benign paroxysmal vertigo, bilateral: Secondary | ICD-10-CM | POA: Diagnosis not present

## 2014-06-11 DIAGNOSIS — R269 Unspecified abnormalities of gait and mobility: Secondary | ICD-10-CM | POA: Diagnosis not present

## 2014-06-11 DIAGNOSIS — H8113 Benign paroxysmal vertigo, bilateral: Secondary | ICD-10-CM | POA: Diagnosis not present

## 2014-06-11 DIAGNOSIS — M25673 Stiffness of unspecified ankle, not elsewhere classified: Secondary | ICD-10-CM | POA: Diagnosis not present

## 2014-06-11 DIAGNOSIS — M6281 Muscle weakness (generalized): Secondary | ICD-10-CM | POA: Diagnosis not present

## 2014-06-20 DIAGNOSIS — H8113 Benign paroxysmal vertigo, bilateral: Secondary | ICD-10-CM | POA: Diagnosis not present

## 2014-06-20 DIAGNOSIS — R269 Unspecified abnormalities of gait and mobility: Secondary | ICD-10-CM | POA: Diagnosis not present

## 2014-06-20 DIAGNOSIS — M6281 Muscle weakness (generalized): Secondary | ICD-10-CM | POA: Diagnosis not present

## 2014-06-20 DIAGNOSIS — M25673 Stiffness of unspecified ankle, not elsewhere classified: Secondary | ICD-10-CM | POA: Diagnosis not present

## 2014-06-26 ENCOUNTER — Encounter (HOSPITAL_COMMUNITY): Payer: Self-pay | Admitting: Cardiology

## 2014-06-27 DIAGNOSIS — R269 Unspecified abnormalities of gait and mobility: Secondary | ICD-10-CM | POA: Diagnosis not present

## 2014-06-27 DIAGNOSIS — M6281 Muscle weakness (generalized): Secondary | ICD-10-CM | POA: Diagnosis not present

## 2014-06-27 DIAGNOSIS — M25673 Stiffness of unspecified ankle, not elsewhere classified: Secondary | ICD-10-CM | POA: Diagnosis not present

## 2014-06-27 DIAGNOSIS — H8113 Benign paroxysmal vertigo, bilateral: Secondary | ICD-10-CM | POA: Diagnosis not present

## 2014-07-03 DIAGNOSIS — H3531 Nonexudative age-related macular degeneration: Secondary | ICD-10-CM | POA: Diagnosis not present

## 2014-07-03 DIAGNOSIS — H26493 Other secondary cataract, bilateral: Secondary | ICD-10-CM | POA: Diagnosis not present

## 2014-07-26 ENCOUNTER — Other Ambulatory Visit: Payer: Self-pay | Admitting: Cardiology

## 2014-07-28 NOTE — Telephone Encounter (Signed)
Rx refill sent to patient pharmacy   

## 2014-07-31 ENCOUNTER — Ambulatory Visit (INDEPENDENT_AMBULATORY_CARE_PROVIDER_SITE_OTHER): Payer: Medicare Other | Admitting: Cardiology

## 2014-07-31 VITALS — BP 110/62 | HR 83 | Ht 75.0 in | Wt 211.0 lb

## 2014-07-31 DIAGNOSIS — R319 Hematuria, unspecified: Secondary | ICD-10-CM

## 2014-07-31 DIAGNOSIS — I951 Orthostatic hypotension: Secondary | ICD-10-CM

## 2014-07-31 DIAGNOSIS — I2510 Atherosclerotic heart disease of native coronary artery without angina pectoris: Secondary | ICD-10-CM | POA: Diagnosis not present

## 2014-07-31 DIAGNOSIS — R0600 Dyspnea, unspecified: Secondary | ICD-10-CM

## 2014-07-31 DIAGNOSIS — I25810 Atherosclerosis of coronary artery bypass graft(s) without angina pectoris: Secondary | ICD-10-CM

## 2014-07-31 DIAGNOSIS — E785 Hyperlipidemia, unspecified: Secondary | ICD-10-CM | POA: Diagnosis not present

## 2014-07-31 DIAGNOSIS — Z9861 Coronary angioplasty status: Secondary | ICD-10-CM | POA: Diagnosis not present

## 2014-07-31 DIAGNOSIS — R5383 Other fatigue: Secondary | ICD-10-CM | POA: Diagnosis not present

## 2014-07-31 DIAGNOSIS — I10 Essential (primary) hypertension: Secondary | ICD-10-CM | POA: Diagnosis not present

## 2014-07-31 NOTE — Patient Instructions (Signed)
Your physician wants you to follow-up in: 6 months with Dr. Harding. You will receive a reminder letter in the mail two months in advance. If you don't receive a letter, please call our office to schedule the follow-up appointment.  

## 2014-08-01 ENCOUNTER — Encounter: Payer: Self-pay | Admitting: Cardiology

## 2014-08-01 NOTE — Assessment & Plan Note (Signed)
Essentially no more vein grafts were patent. He is at PCI to his native vessels now with PCI of the RCA. His LIMA to LAD is patent. The circumflex and diagonal vessel not amenable to PCI. No active anginal symptoms. He has slowed a bit but has not had any significant symptoms of chest breast exertion. Mildly reduced EF of 40-50% by prior evaluation but no signs of heart failure. He is on low-dose beta blocker  Which is high as can be titrated for his blood pressure range. He is on a statin and Plavix.

## 2014-08-01 NOTE — Progress Notes (Signed)
PCP: Pearla Dubonnet, MD  Clinic Note: Chief Complaint  Patient presents with  . Follow-up    6 Months, pt denied chest pain and SOB, pt c/o balancing problem  . Coronary Artery Disease    Status post CABG and PCI    HPI: Joshua Chambers is a 81 y.o. male with a PMH below who presents today for ~6 month follow-up for his CAD cardiac perspective.[name]  He is a very pleasant gentleman who I been following for a few years now.  He had a history of Multivessel CAD s/p CABG in 2007 with PCI of the Native RCA due to near occlusion of extensively disease SVG-RCA.  A long segment was treated with a single Promus Premier DES 3.0 mm x 38 mm (post-dilated to 3.6 mm).  Initially on Brilinta, he was converted to Plavix due to "gasping dsypena" intolerance.  I saw him last year in January for exertional dyspnea -- with notable improvement in response to Inhales/Nebulizers.  When I saw him last in July 2015, he was doing quite well.  Interval History: Since his last visit, he has continued to do well,. He denies having anymore chest pressure sensation with rest or exertion, and really is not noticing any dyspnea. He does still sometimes get exertional dyspnea if he overdoes it. He has been doing physical therapy for about an hour at a time today's weight. He says when he does that he just feels very sore and tired afterwards, and was wondering if he should continue to do the therapy. He really isn't noticing much lately or dyspnea now.  No heart failure symptoms of PND, orthopnea or edema.  He continues to complain of his main issue being poor balance. He is using a cane and feels as though he tilts to the left. He has not had any falls or any dizziness or lightheadedness associated with it. He denies any TIA/sclerosis fugax or syncope/near-syncope symptoms. The other thing he notes is that he is sleeping a lot more than usual. Can take up to one or 2 hour nap in the afternoon.  He says his legs ache some a  night and somewhat cramp but don't bother him too much. No lightheadedness, syncope or near syncope. He does have the dizziness is not really vertiginous in nature.  Past Medical History  Diagnosis Date  . CAD (coronary artery disease), native coronary artery 2007    Multivessel CAD referred for CABG  . S/P CABG x 3 2007    LIMA-LAD, SVG-DIAG, SVG-RCA  . CAD (coronary artery disease), autologous vein bypass graft 04/19/2013    100% flush occlusion of SVG-OM & SVG-Diag; near Subtotal Occlusion of SVG-RCA (PCI of RCA done to avoid distal embolization with PCI to SVG)  . CAD S/P percutaneous coronary angioplasty  04/19/2013    PCI of Native RCA - Promus Premier DES 3.0 mm x 38 mm (3.6 mm);   [name] History of stroke August 2011    Possible TIA in 2001;  Echo: EF 45-50%, mild HK of anterior septum.  [name] Hx of orthostatic hypotension 2009    Positive tilt table:  dizziness/vasopressor type; previously on the decision   . Dyslipidemia, goal LDL below 70   . Hx SBO   . History of DVT of lower extremity     Right popliteal vein  . Nephrolithiasis   . Arthritis   . Unsteady gait     Prior Cardiac Evaluation and Past Surgical History: Past Surgical History  Procedure  Laterality Date  . Coronary artery bypass graft  2007    LIMA-LAD, SVG-D1, SVG-RCA  . Appendectomy    . Tonsillectomy    . Colon surgery    . Coronary angioplasty with stent placement  04/19/13    Stent-Promus DES (3.0 mm x 38 mm - 3.6 mm) to native RCA prox-mid, found closure of VG to RCA,  closed graft to diag, patent LIMA  . Left heart catheterization with coronary/graft angiogram Left 04/19/2013    Procedure: LEFT HEART CATHETERIZATION WITH Isabel Caprice;  Surgeon: [name], MD;  Location: [facility] CATH LAB;  Service: Cardiovascular;  Laterality: Left;    Allergies  Allergen Reactions  . Amiodarone Other (See Comments)    Extreme dehydration  . Diltiazem Other (See Comments)    Pt unsure of reaction  . Sulfa  Antibiotics Other (See Comments)    Pt unsure of reaction    Current Outpatient Prescriptions  Medication Sig Dispense Refill  . atorvastatin (LIPITOR) 40 MG tablet TAKE 1 TABLET DAILY AT     BEDTIME 90 tablet 3  . clopidogrel (PLAVIX) 75 MG tablet TAKE 1 TABLET BY MOUTH EVERY DAY 90 tablet 3  . dutasteride (AVODART) 0.5 MG capsule Take 0.5 mg by mouth at bedtime.    . metoprolol tartrate (LOPRESSOR) 25 MG tablet TK1/2 TABLET BY MOUTH TWICE DAILY 30 tablet 4  . midodrine (PROAMATINE) 10 MG tablet TAKE 1 TABLET BY MOUTH THREE TIMES DAILY WITH MEALS 90 tablet 5  . nitroGLYCERIN (NITROSTAT) 0.4 MG SL tablet Place 1 tablet (0.4 mg total) under the tongue every 5 (five) minutes as needed for chest pain. 25 tablet 3  . Polyvinyl Alcohol-Povidone (REFRESH OP) Place 1 drop into both eyes daily as needed.    [name] PULMICORT FLEXHALER 180 MCG/ACT inhaler Place 2 puffs into the nose daily.    . ranitidine (ZANTAC) 150 MG tablet Take 150 mg by mouth 2 (two) times daily as needed for heartburn.      No current facility-administered medications for this visit.   No changes in Social or Family History  ROS: A comprehensive Review of Systems -  Was performed Review of Systems  Constitutional: Negative for malaise/fatigue.  HENT: Negative for nosebleeds.   Respiratory: Negative for cough.   Cardiovascular: Negative for claudication.  Gastrointestinal: Positive for melena. Negative for blood in stool.  Genitourinary: Negative for hematuria.  Musculoskeletal: Positive for myalgias (After doing physical therapy) and joint pain (Arthritis pains in his knees and hips).  Neurological: Positive for dizziness (Poor balance. He needs to walk with a cane now. - ). Negative for loss of consciousness.  Endo/Heme/Allergies: Does not bruise/bleed easily.  Psychiatric/Behavioral: Negative for depression and memory loss. The patient is not nervous/anxious and does not have insomnia.   All other systems reviewed and are  negative.  PHYSICAL EXAM BP 110/62 mmHg  Pulse 83  Ht 6\' 3"  (1.905 m)  Wt 211 lb (95.709 kg)  BMI 26.37 kg/m2 General appearance: Alert and oriented x3  He answers questions appropriately. He does not appear to be in acute distress today. Relatively healthy appearing and well-nourished/well-groomed.  Neck: Supple, no adenopathy, no carotid bruit or JVD Lungs: CTA B., normal percussion bilaterally and no wheezes rales or rhonchi. Heart: regular rate and rhythm, S1, S2 normal, no murmur, click, rub or gallop, normal apical impulse and Occasional ectopy Abdomen: soft, non-tender; bowel sounds normal; no masses,  no organomegaly Extremities: extremities normal, atraumatic, no cyanosis or edema, no edema, Pulses: 2+  and symmetric Neurologic: Alert and oriented X 3, normal strength and tone. Normal symmetric reflexes. Normal coordination and gait is somewhat unsteady, as he uses a cane for support.  CN II through XII grossly intact  VEL:[id number] today: Yes Rate: 83, Rhythm: Sinus rhythm with PVC & PACs.  Nnspecific ST-T changes, Otherwise essentially normal ECG  Recent Labs:  February 2015 -- due for recheck in February 2016  ASSESSMENT / PLAN: Coronary artery disease involving coronary bypass graft of native heart without angina pectoris; CAD (coronary artery disease) of artery bypass graft:  loss of VG to RCA and VG-Diag Essentially no more vein grafts were patent. He is at PCI to his native vessels now with PCI of the RCA. His LIMA to LAD is patent. The circumflex and diagonal vessel not amenable to PCI. No active anginal symptoms. He has slowed a bit but has not had any significant symptoms of chest breast exertion. Mildly reduced EF of 40-50% by prior evaluation but no signs of heart failure. He is on low-dose beta blocker  Which is high as can be titrated for his blood pressure range. He is on a statin and Plavix.   CAD S/P percutaneous coronary angioplasty - PCI of native RCA; Promus  DES 3.0 mm x38 mm (3.6 mm)  He is on Plavix alone without aspirin for long stent.okay to stop her procedures provided he replaces it with aspirin 81 mg. When necessary nitroglycerin refill.   Essential hypertension Relatively stable blood pressure now. He still takes Midodrin to keep his blood pressures while avoid orthostasis. As a result, no further medications on low-dose Lopressor as ordered.   Orthostatic hypotension Still taking Midorin/ProAmatine   Hematuria No further episodes since he's been on Plavix as opposed to Brilinta    Fatigue The symptoms seem to have improved with increasing blood pressure. Not actively having heart failure symptoms. He does fall asleep easily but is not feeling fatigued otherwise.    Orders Placed This Encounter  Procedures  . EKG 12-Lead   Meds ordered this encounter  Medications  . PULMICORT FLEXHALER 180 MCG/ACT inhaler    Sig: Place 2 puffs into the nose daily.    Followup: Roughly  6  months  Gracia Saggese, Piedad Climes, M.D., M.S. Interventional Cardiologist   Pager # [phone]

## 2014-08-01 NOTE — Assessment & Plan Note (Signed)
The symptoms seem to have improved with increasing blood pressure. Not actively having heart failure symptoms. He does fall asleep easily but is not feeling fatigued otherwise.

## 2014-08-01 NOTE — Assessment & Plan Note (Signed)
He is on Plavix alone without aspirin for long stent.okay to stop her procedures provided he replaces it with aspirin 81 mg. When necessary nitroglycerin refill.

## 2014-08-01 NOTE — Assessment & Plan Note (Addendum)
Relatively stable blood pressure now. He still takes Midodrin to keep his blood pressures while avoid orthostasis. As a result, no further medications on low-dose Lopressor as ordered.

## 2014-08-01 NOTE — Assessment & Plan Note (Signed)
Still taking Midorin/ProAmatine

## 2014-08-01 NOTE — Assessment & Plan Note (Signed)
No further episodes since he's been on Plavix as opposed to [employer]

## 2014-08-05 ENCOUNTER — Encounter: Payer: Self-pay | Admitting: Cardiology

## 2014-08-13 DIAGNOSIS — B0229 Other postherpetic nervous system involvement: Secondary | ICD-10-CM | POA: Diagnosis not present

## 2014-08-21 ENCOUNTER — Telehealth: Payer: Self-pay | Admitting: Cardiology

## 2014-08-21 MED ORDER — ATORVASTATIN CALCIUM 40 MG PO TABS: 40.0000 mg | ORAL_TABLET | Freq: Every day | ORAL | Status: DC

## 2014-08-21 NOTE — Telephone Encounter (Signed)
°  1. Which medications need to be refilled? Atorvastatin  2. Which pharmacy is medication to be sent to?[facility] on [address]  3. Do they need a 30 day or 90 day supply? 30  4. Would they like a call back once the medication has been sent to the pharmacy? yes

## 2014-08-21 NOTE — Telephone Encounter (Signed)
Rx(s) sent to pharmacy electronically. Patient notified. 

## 2014-09-12 DIAGNOSIS — I2510 Atherosclerotic heart disease of native coronary artery without angina pectoris: Secondary | ICD-10-CM | POA: Diagnosis not present

## 2014-09-12 DIAGNOSIS — Z0001 Encounter for general adult medical examination with abnormal findings: Secondary | ICD-10-CM | POA: Diagnosis not present

## 2014-09-12 DIAGNOSIS — G629 Polyneuropathy, unspecified: Secondary | ICD-10-CM | POA: Diagnosis not present

## 2014-09-12 DIAGNOSIS — J449 Chronic obstructive pulmonary disease, unspecified: Secondary | ICD-10-CM | POA: Diagnosis not present

## 2014-09-12 DIAGNOSIS — E782 Mixed hyperlipidemia: Secondary | ICD-10-CM | POA: Diagnosis not present

## 2014-09-12 DIAGNOSIS — Z1389 Encounter for screening for other disorder: Secondary | ICD-10-CM | POA: Diagnosis not present

## 2014-09-12 DIAGNOSIS — I498 Other specified cardiac arrhythmias: Secondary | ICD-10-CM | POA: Diagnosis not present

## 2014-09-12 DIAGNOSIS — B0229 Other postherpetic nervous system involvement: Secondary | ICD-10-CM | POA: Diagnosis not present

## 2014-09-12 DIAGNOSIS — H6120 Impacted cerumen, unspecified ear: Secondary | ICD-10-CM | POA: Diagnosis not present

## 2014-09-12 DIAGNOSIS — Z79899 Other long term (current) drug therapy: Secondary | ICD-10-CM | POA: Diagnosis not present

## 2014-09-12 DIAGNOSIS — R42 Dizziness and giddiness: Secondary | ICD-10-CM | POA: Diagnosis not present

## 2014-09-12 DIAGNOSIS — Z23 Encounter for immunization: Secondary | ICD-10-CM | POA: Diagnosis not present

## 2014-10-01 ENCOUNTER — Other Ambulatory Visit: Payer: Self-pay | Admitting: Physician Assistant

## 2014-12-10 DIAGNOSIS — R490 Dysphonia: Secondary | ICD-10-CM | POA: Diagnosis not present

## 2014-12-10 DIAGNOSIS — J309 Allergic rhinitis, unspecified: Secondary | ICD-10-CM | POA: Diagnosis not present

## 2014-12-10 DIAGNOSIS — R0982 Postnasal drip: Secondary | ICD-10-CM | POA: Diagnosis not present

## 2014-12-14 ENCOUNTER — Other Ambulatory Visit: Payer: Self-pay | Admitting: Cardiology

## 2014-12-16 NOTE — Telephone Encounter (Signed)
Rx(s) sent to pharmacy electronically.  

## 2015-01-28 ENCOUNTER — Ambulatory Visit (INDEPENDENT_AMBULATORY_CARE_PROVIDER_SITE_OTHER): Payer: Medicare Other | Admitting: Cardiology

## 2015-01-28 ENCOUNTER — Encounter: Payer: Self-pay | Admitting: Cardiology

## 2015-01-28 VITALS — BP 142/70 | HR 70 | Ht 74.0 in | Wt 208.6 lb

## 2015-01-28 DIAGNOSIS — I10 Essential (primary) hypertension: Secondary | ICD-10-CM

## 2015-01-28 DIAGNOSIS — I25810 Atherosclerosis of coronary artery bypass graft(s) without angina pectoris: Secondary | ICD-10-CM

## 2015-01-28 DIAGNOSIS — Z79899 Other long term (current) drug therapy: Secondary | ICD-10-CM

## 2015-01-28 DIAGNOSIS — I2510 Atherosclerotic heart disease of native coronary artery without angina pectoris: Secondary | ICD-10-CM

## 2015-01-28 DIAGNOSIS — Z9861 Coronary angioplasty status: Secondary | ICD-10-CM | POA: Diagnosis not present

## 2015-01-28 DIAGNOSIS — Z951 Presence of aortocoronary bypass graft: Secondary | ICD-10-CM | POA: Diagnosis not present

## 2015-01-28 DIAGNOSIS — R5383 Other fatigue: Secondary | ICD-10-CM

## 2015-01-28 DIAGNOSIS — E785 Hyperlipidemia, unspecified: Secondary | ICD-10-CM

## 2015-01-28 DIAGNOSIS — I951 Orthostatic hypotension: Secondary | ICD-10-CM

## 2015-01-28 NOTE — Progress Notes (Signed)
PCP: Pearla Dubonnet, MD  Clinic Note: Chief Complaint  Patient presents with  . Follow-up     no chest pain, occassional shortness of breath, no edema, no pain in legs,no cramping in legs, has lightheadedness, has dizziness  . Coronary Artery Disease    HPI: Joshua Chambers is a 81 y.o. male with a PMH below who presents today for 6 month follow up of his CAD and other cardiac risk factors. He had a history of Multivessel CAD s/p CABG in 2007 with PCI of the Native RCA due to near occlusion of extensively disease SVG-RCA. A long segment was treated with a single Promus Premier DES 3.0 mm x 38 mm (post-dilated to 3.6 mm). Initially on Brilinta, he was converted to Plavix due to "gasping dsypena" intolerance. I saw him last year in January for exertional dyspnea -- with notable improvement in response to Inhales/Nebulizers.   NTHONY Chambers was last seen on Jul 31 2014 - he continued to do well without any major complaints. He has been walking with the cane in order to combat his loss of balance. Because of this he is not as active as he used to be.  Recent Hospitalizations: n/a  Studies Reviewed: n/a  Interval History: Joshua Chambers in good spirits today, he says he is feeling good with no complaints. His mood was lower than he used to. He still does his walking is physical therapy training was to watch her in to do. He  Felt as though the training was too hard. He does say that he still gets tired a little easier to use 2, but acknowledges that he is not doing as much as he used to either.  Cardiovascular ROS: no chest pain or dyspnea on exertion positive for - getting tired a bit easier negative for - edema, irregular heartbeat, loss of consciousness, orthopnea, palpitations, paroxysmal nocturnal dyspnea, rapid heart rate, shortness of breath or Near syncope, TIA/amaurosis fugax    Past Medical History  Diagnosis Date  . CAD (coronary artery disease), native coronary artery 2007   Multivessel CAD referred for CABG  . S/P CABG x 3 2007    LIMA-LAD, SVG-DIAG, SVG-RCA  . CAD (coronary artery disease), autologous vein bypass graft 04/19/2013    100% flush occlusion of SVG-OM & SVG-Diag; near Subtotal Occlusion of SVG-RCA (PCI of RCA done to avoid distal embolization with PCI to SVG)  . CAD S/P percutaneous coronary angioplasty  04/19/2013    PCI of Native RCA - Promus Premier DES 3.0 mm x 38 mm (3.6 mm);   [name] History of stroke August 2011    Possible TIA in 2001;  Echo: EF 45-50%, mild HK of anterior septum.  [name] Hx of orthostatic hypotension 2009    Positive tilt table:  dizziness/vasopressor type; previously on the decision   . Dyslipidemia, goal LDL below 70   . Hx SBO   . History of DVT of lower extremity     Right popliteal vein  . Nephrolithiasis   . Arthritis   . Unsteady gait     Past Surgical History  Procedure Laterality Date  . Coronary artery bypass graft  2007    LIMA-LAD, SVG-D1, SVG-RCA  . Appendectomy    . Tonsillectomy    . Colon surgery    . Left heart catheterization with coronary/graft angiogram Left 04/19/2013    LEFT HEART CATH W/ CORONARY/GRAFT ANGIOGRAM;  [name], MD;  Location: [facility] CATH LAB;  LM 20%-->LAD 70-80% then subTO after  D1 (Patent LIMA-LAD); Small non-dom Cx ~20% ostial.  RCA long 60-70% prox --> 90% mid.  SVG-OM & SVG-Diag 100%, diffuse Subtotal occlusion of SVG-rPD  . Coronary angioplasty with stent placement  04/19/13    Stent-Promus DES (3.0 mm x 38 mm - 3.6 mm) to native RCA prox-mid,    ROS: A comprehensive was performed. Review of Systems  Constitutional: Negative for malaise/fatigue.  HENT: Negative for nosebleeds.   Eyes: Negative for blurred vision.  Respiratory: Negative for cough.   Cardiovascular: Negative for claudication.  Gastrointestinal: Negative for blood in stool and melena.  Genitourinary: Negative for hematuria.  Musculoskeletal: Positive for joint pain. Negative for falls.  Neurological:        Poor balance  Psychiatric/Behavioral: Negative.   All other systems reviewed and are negative.   Current Outpatient Prescriptions on File Prior to Visit  Medication Sig Dispense Refill  . atorvastatin (LIPITOR) 40 MG tablet Take 1 tablet (40 mg total) by mouth daily at 6 PM. 30 tablet 11  . clopidogrel (PLAVIX) 75 MG tablet TAKE 1 TABLET BY MOUTH EVERY DAY 90 tablet 3  . dutasteride (AVODART) 0.5 MG capsule Take 0.5 mg by mouth at bedtime.    . metoprolol tartrate (LOPRESSOR) 25 MG tablet Take 0.5 tablets (12.5 mg total) by mouth 2 (two) times daily. 30 tablet 8  . midodrine (PROAMATINE) 10 MG tablet TAKE 1 TABLET BY MOUTH THREE TIMES DAILY WITH MEALS 90 tablet 10  . nitroGLYCERIN (NITROSTAT) 0.4 MG SL tablet Place 1 tablet (0.4 mg total) under the tongue every 5 (five) minutes as needed for chest pain. 25 tablet 3  . Polyvinyl Alcohol-Povidone (REFRESH OP) Place 1 drop into both eyes daily as needed.    [name] PULMICORT FLEXHALER 180 MCG/ACT inhaler Place 2 puffs into the nose daily.    . ranitidine (ZANTAC) 150 MG tablet Take 150 mg by mouth 2 (two) times daily as needed for heartburn.      No current facility-administered medications on file prior to visit.   Allergies  Allergen Reactions  . Amiodarone Other (See Comments)    Extreme dehydration  . Diltiazem Other (See Comments)    Pt unsure of reaction  . Sulfa Antibiotics Other (See Comments)    Pt unsure of reaction    History   Social History  . Marital Status: Married    Spouse Name: N/A  . Number of Children: N/A  . Years of Education: N/A   Occupational History  . Not on file.   Social History Main Topics  . Smoking status: Never Smoker   . Smokeless tobacco: Never Used  . Alcohol Use: No  . Drug Use: No  . Sexual Activity: Not on file   Other Topics Concern  . Not on file   Social History Narrative    He is now remarried for the last 2 years, his first wife of 60 years is deceased. He has 1   child from that  first marriage and 2 grandchildren.    He does not smoke, does not drink.    He is not very active but he does kind of meander around with his new wife who is permanently disabled.   family history is not on file. -- non-contributory   Wt Readings from Last 3 Encounters:  01/28/15 94.62 kg (208 lb 9.6 oz)  07/31/14 95.709 kg (211 lb)  01/27/14 96.662 kg (213 lb 1.6 oz)    PHYSICAL EXAM BP 142/70 mmHg  Pulse 70  Ht 6\' 2"  (1.88 m)  Wt 94.62 kg (208 lb 9.6 oz)  BMI 26.77 kg/m2 BP 110/62 mmHg  Pulse 83  Ht 6\' 3"  (1.905 m)  Wt 211 lb (95.709 kg)  BMI 26.37 kg/m2 General appearance: Alert and oriented x3 He answers questions appropriately. He does not appear to be in acute distress today. Relatively healthy appearing and well-nourished/well-groomed.  Neck: Supple, no adenopathy, no carotid bruit or JVD Lungs: CTA B., normal percussion bilaterally and no wheezes rales or rhonchi. Heart: regular rate and rhythm, S1, S2 normal, no murmur, click, rub or gallop, normal apical impulse and Occasional ectopy Abdomen: soft, non-tender; bowel sounds normal; no masses, no organomegaly Extremities: extremities normal, atraumatic, no cyanosis or edema, no edema, Pulses: 2+ and symmetric Neurologic: Alert and oriented X 3, normal strength and tone. Normal symmetric reflexes. Normal coordination and gait is somewhat unsteady, as he uses a cane for support. CN II through XII grossly intact   Adult ECG Report  Rate: 70 ;  Rhythm: normal sinus rhythm and premature atrial contractions [facility])  Narrative Interpretation: Otherwise normal EKG.   Other studies Reviewed: Additional studies/ records that were reviewed today include:  Recent Labs:  No recent labs   ASSESSMENT / PLAN: Problem List Items Addressed This Visit    CAD S/P percutaneous coronary angioplasty - PCI of native RCA; Promus DES 3.0 mm x38 mm (3.6 mm) (Chronic)    On Plavix alone. On beta blocker and statin. No anginal symptoms. When  necessary nitroglycerin. We talked about timing of potential ejecting a stress test. That'll probably be in about 2 years. He indicated he probably would not be interested in any screening tests unless he has symptoms.      Coronary artery disease involving coronary bypass graft of native heart without angina pectoris; CAD (coronary artery disease) of artery bypass graft:  loss of VG to RCA and VG-Diag - Primary (Chronic)    Only patent LIMA-LAD. Circumflex and diagonal vessels none PCI.  Native RCA stented with long DES stent and dramatic improvement of symptoms. Because of mild reduction in EF but no heart failure symptoms. For medical management is only on a low dose beta blocker. Allowing for permissive hypertension to avoid orthostatic hypotension.      Relevant Orders   EKG 12-Lead (Completed)   Lipid panel   Comprehensive metabolic panel   Dyslipidemia, goal LDL below 70 (Chronic)    On statin, however he is not sure when the last time his PCP check the lab. I have not seen any recent labs. Just to make sure that his LFTs are okay and his lipids are not well controlled we will check lipid panel and LFTs/CMP      Relevant Orders   EKG 12-Lead (Completed)   Lipid panel   Comprehensive metabolic panel   Essential hypertension (Chronic)    He is right in the chart exam his blood pressure. On low-dose beta blocker as well midodrine.      Relevant Orders   EKG 12-Lead (Completed)   Lipid panel   Comprehensive metabolic panel   Fatigue (Chronic)    Overall these symptoms are pretty much better he says he is tired easier. I think he probably has to do with his Down's issues and lack of continued physical activity.      Orthostatic hypotension    Still taking Midodrine/PrioAmatine      S/P CABG x 4; LIMA-LAD, SVG-RPDA, SVG-OM, SVG-D1 -- all but LIMA-LAD occluded (Chronic)  Relevant Orders   EKG 12-Lead (Completed)   Lipid panel   Comprehensive metabolic panel    Other Visit  Diagnoses    Polypharmacy        Relevant Orders    Lipid panel    Comprehensive metabolic panel       Current medicines are reviewed at length with the patient today. (+/- concerns) n/a The following changes have been made: n/a   NO CHANGE IN MEDICATIONS  Your physician wants you to follow-up in 6 MONTHS DR Ronelle Michie.   [name], M.D., M.S. Interventional Cardiologist   Pager # [phone]

## 2015-01-28 NOTE — Patient Instructions (Signed)
LABS -CMP ,LIPID   NO CHANGE IN MEDICATIONS  Your physician wants you to follow-up in 6 MONTHS DR HARDING.  You will receive a reminder letter in the mail two months in advance. If you don't receive a letter, please call our office to schedule the follow-up appointment.

## 2015-01-30 ENCOUNTER — Encounter: Payer: Self-pay | Admitting: Cardiology

## 2015-01-30 NOTE — Assessment & Plan Note (Signed)
Overall these symptoms are pretty much better he says he is tired easier. I think he probably has to do with his Down's issues and lack of continued physical activity.

## 2015-01-30 NOTE — Assessment & Plan Note (Signed)
Still taking Midodrine/PrioAmatine

## 2015-01-30 NOTE — Assessment & Plan Note (Signed)
On Plavix alone. On beta blocker and statin. No anginal symptoms. When necessary nitroglycerin. We talked about timing of potential ejecting a stress test. That'll probably be in about 2 years. He indicated he probably would not be interested in any screening tests unless he has symptoms.

## 2015-01-30 NOTE — Assessment & Plan Note (Signed)
On statin, however he is not sure when the last time his PCP check the lab. I have not seen any recent labs. Just to make sure that his LFTs are okay and his lipids are not well controlled we will check lipid panel and LFTs/CMP

## 2015-01-30 NOTE — Assessment & Plan Note (Signed)
He is right in the chart exam his blood pressure. On low-dose beta blocker as well midodrine.

## 2015-01-30 NOTE — Assessment & Plan Note (Signed)
Only patent LIMA-LAD. Circumflex and diagonal vessels none PCI.  Native RCA stented with long DES stent and dramatic improvement of symptoms. Because of mild reduction in EF but no heart failure symptoms. For medical management is only on a low dose beta blocker. Allowing for permissive hypertension to avoid orthostatic hypotension.

## 2015-02-03 DIAGNOSIS — N390 Urinary tract infection, site not specified: Secondary | ICD-10-CM | POA: Diagnosis not present

## 2015-02-03 DIAGNOSIS — R351 Nocturia: Secondary | ICD-10-CM | POA: Diagnosis not present

## 2015-02-03 DIAGNOSIS — N138 Other obstructive and reflux uropathy: Secondary | ICD-10-CM | POA: Diagnosis not present

## 2015-02-03 DIAGNOSIS — N401 Enlarged prostate with lower urinary tract symptoms: Secondary | ICD-10-CM | POA: Diagnosis not present

## 2015-02-19 DIAGNOSIS — H04123 Dry eye syndrome of bilateral lacrimal glands: Secondary | ICD-10-CM | POA: Diagnosis not present

## 2015-02-19 DIAGNOSIS — H35363 Drusen (degenerative) of macula, bilateral: Secondary | ICD-10-CM | POA: Diagnosis not present

## 2015-02-19 DIAGNOSIS — H26493 Other secondary cataract, bilateral: Secondary | ICD-10-CM | POA: Diagnosis not present

## 2015-03-17 DIAGNOSIS — R490 Dysphonia: Secondary | ICD-10-CM | POA: Diagnosis not present

## 2015-03-17 DIAGNOSIS — Z23 Encounter for immunization: Secondary | ICD-10-CM | POA: Diagnosis not present

## 2015-03-17 DIAGNOSIS — R311 Benign essential microscopic hematuria: Secondary | ICD-10-CM | POA: Diagnosis not present

## 2015-03-17 DIAGNOSIS — R0982 Postnasal drip: Secondary | ICD-10-CM | POA: Diagnosis not present

## 2015-03-17 DIAGNOSIS — K219 Gastro-esophageal reflux disease without esophagitis: Secondary | ICD-10-CM | POA: Diagnosis not present

## 2015-03-17 DIAGNOSIS — E782 Mixed hyperlipidemia: Secondary | ICD-10-CM | POA: Diagnosis not present

## 2015-03-17 DIAGNOSIS — J309 Allergic rhinitis, unspecified: Secondary | ICD-10-CM | POA: Diagnosis not present

## 2015-03-17 DIAGNOSIS — B0229 Other postherpetic nervous system involvement: Secondary | ICD-10-CM | POA: Diagnosis not present

## 2015-03-17 DIAGNOSIS — J449 Chronic obstructive pulmonary disease, unspecified: Secondary | ICD-10-CM | POA: Diagnosis not present

## 2015-03-17 DIAGNOSIS — I498 Other specified cardiac arrhythmias: Secondary | ICD-10-CM | POA: Diagnosis not present

## 2015-03-17 DIAGNOSIS — Z79899 Other long term (current) drug therapy: Secondary | ICD-10-CM | POA: Diagnosis not present

## 2015-03-17 DIAGNOSIS — I2510 Atherosclerotic heart disease of native coronary artery without angina pectoris: Secondary | ICD-10-CM | POA: Diagnosis not present

## 2015-04-04 ENCOUNTER — Other Ambulatory Visit: Payer: Self-pay | Admitting: Cardiology

## 2015-04-06 NOTE — Telephone Encounter (Signed)
REFILL 

## 2015-04-27 ENCOUNTER — Other Ambulatory Visit: Payer: Self-pay | Admitting: Cardiology

## 2015-05-01 DIAGNOSIS — L0291 Cutaneous abscess, unspecified: Secondary | ICD-10-CM | POA: Diagnosis not present

## 2015-05-01 DIAGNOSIS — L723 Sebaceous cyst: Secondary | ICD-10-CM | POA: Diagnosis not present

## 2015-05-01 DIAGNOSIS — L02419 Cutaneous abscess of limb, unspecified: Secondary | ICD-10-CM | POA: Diagnosis not present

## 2015-06-08 DIAGNOSIS — N138 Other obstructive and reflux uropathy: Secondary | ICD-10-CM | POA: Diagnosis not present

## 2015-06-08 DIAGNOSIS — R351 Nocturia: Secondary | ICD-10-CM | POA: Diagnosis not present

## 2015-06-08 DIAGNOSIS — N401 Enlarged prostate with lower urinary tract symptoms: Secondary | ICD-10-CM | POA: Diagnosis not present

## 2015-07-28 ENCOUNTER — Ambulatory Visit (INDEPENDENT_AMBULATORY_CARE_PROVIDER_SITE_OTHER): Payer: Medicare Other | Admitting: Cardiology

## 2015-07-28 ENCOUNTER — Encounter: Payer: Self-pay | Admitting: Cardiology

## 2015-07-28 VITALS — BP 110/70 | HR 76 | Ht 75.0 in | Wt 207.0 lb

## 2015-07-28 DIAGNOSIS — I10 Essential (primary) hypertension: Secondary | ICD-10-CM

## 2015-07-28 DIAGNOSIS — E785 Hyperlipidemia, unspecified: Secondary | ICD-10-CM

## 2015-07-28 DIAGNOSIS — I491 Atrial premature depolarization: Secondary | ICD-10-CM

## 2015-07-28 DIAGNOSIS — I25810 Atherosclerosis of coronary artery bypass graft(s) without angina pectoris: Secondary | ICD-10-CM

## 2015-07-28 DIAGNOSIS — Z9861 Coronary angioplasty status: Secondary | ICD-10-CM

## 2015-07-28 DIAGNOSIS — I2510 Atherosclerotic heart disease of native coronary artery without angina pectoris: Secondary | ICD-10-CM | POA: Diagnosis not present

## 2015-07-28 HISTORY — DX: Atrial premature depolarization: I49.1

## 2015-07-28 NOTE — Assessment & Plan Note (Signed)
LIMA LAD patent but otherwise native vessels were occluded. At this point I would consider this to be routine native vessel follow-up. Would not recommend screening follow-up Myoview unless he has symptoms.

## 2015-07-28 NOTE — Assessment & Plan Note (Signed)
No recurrent anginal symptoms. Has not had use any when necessary nitroglycerin. He is on Plavix without aspirin. On low-dose beta blocker - not titrate up secondary to orthostatic dizziness on Midrin currently. He is on statin with LAD a monitored by PCP. At this point, unless she has any symptoms, would not recommend any screening test unless he does.

## 2015-07-28 NOTE — Assessment & Plan Note (Signed)
Allowing for permissive hypertension a stone his history of orthostatic hypotension. He is on Midrin, and blood pressure is only 110/70. Maintaining low-dose beta blocker based on his PACs on EKG.

## 2015-07-28 NOTE — Patient Instructions (Signed)
NO CHANGE IN CURRENT MEDICATIONS   Your physician wants you to follow-up in 6 MONTHS WITH DR HADING.  You will receive a reminder letter in the mail two months in advance. If you don't receive a letter, please call our office to schedule the follow-up appointment.  If you need a refill on your cardiac medications before your next appointment, please call your pharmacy.

## 2015-07-28 NOTE — Assessment & Plan Note (Signed)
Asymptomatic, but relatively frequent. Would maintain treatment with low-dose beta blocker. I don't want to titrate up further because of his orthostatic hypotension.

## 2015-07-28 NOTE — Progress Notes (Signed)
PCP: Pearla Dubonnet, MD  Clinic Note: Chief Complaint  Patient presents with  . 6 months  . Dizziness  . Shortness of Breath  . Coronary Artery Disease    HPI: Joshua Chambers is a 82 y.o. male with a PMH below who presents today for 6 month follow up of his CAD and other cardiac risk factors. He had a history of Multivessel CAD s/p CABG in 2007 with PCI of the Native RCA due to near occlusion of extensively disease SVG-RCA. A long segment was treated with a single Promus Premier DES 3.0 mm x 38 mm (post-dilated to 3.6 mm). Initially on Brilinta, he was converted to Plavix due to "gasping dsypena" intolerance. I saw him last year in January for exertional dyspnea -- with notable improvement in response to Inhales/Nebulizers.   [name] was last seen on January 28, 2015 - he continued to do well without any major complaints. He has been walking with the cane in order to combat his loss of balance. Because of this he is not as active as he used to be.  Despite this, he was in good spirits   Recent Hospitalizations: n/a  Studies Reviewed: n/a  Interval History: Joshua Chambers again looks to be in good spirits.  He notes that he recently turned 22 & thinks that he may be "slowing down a bit."  He is no longer doing routine exercise, partly because of his balance issues. He is using his cane which helps a lot. He does not use a cane around the house, but does use it for walking longer distances. He does remain active, but this does not do scheduled walking/exercise. He has noted some exertional dyspnea, partly because he is become a little bit deconditioned. But otherwise been relatively symptomatically cardiac standpoint. He denies any palpitations or irregular heartbeats despite KG findings.   Cardiovascular ROS: positive for - dyspnea on exertion and getting tired a bit easier negative for - chest pain, edema, irregular heartbeat, loss of consciousness, orthopnea, palpitations, paroxysmal  nocturnal dyspnea, rapid heart rate, shortness of breath or Near syncope, TIA/amaurosis fugax    Past Medical History  Diagnosis Date  . CAD (coronary artery disease), native coronary artery 2007    Multivessel CAD referred for CABG  . S/P CABG x 3 2007    LIMA-LAD, SVG-DIAG, SVG-RCA  . CAD (coronary artery disease), autologous vein bypass graft 04/19/2013    100% flush occlusion of SVG-OM & SVG-Diag; near Subtotal Occlusion of SVG-RCA (PCI of RCA done to avoid distal embolization with PCI to SVG)  . CAD S/P percutaneous coronary angioplasty  04/19/2013    PCI of Native RCA - Promus Premier DES 3.0 mm x 38 mm (3.6 mm);   Joshua Chambers History of stroke August 2011    Possible TIA in 2001;  Echo: EF 45-50%, mild HK of anterior septum.  Joshua Chambers Hx of orthostatic hypotension 2009    Positive tilt table:  dizziness/vasopressor type; previously on the decision   . Dyslipidemia, goal LDL below 70   . Hx SBO   . History of DVT of lower extremity     Right popliteal vein  . Nephrolithiasis   . Arthritis   . Unsteady gait     Past Surgical History  Procedure Laterality Date  . Coronary artery bypass graft  2007    LIMA-LAD, SVG-D1, SVG-RCA  . Appendectomy    . Tonsillectomy    . Colon surgery    . Left heart catheterization with  coronary/graft angiogram Left 04/19/2013    LEFT HEART CATH W/ CORONARY/GRAFT ANGIOGRAM;  [name], MD;  Location: [facility] CATH LAB;  LM 20%-->LAD 70-80% then subTO after D1 (Patent LIMA-LAD); Small non-dom Cx ~20% ostial.  RCA long 60-70% prox --> 90% mid.  SVG-OM & SVG-Diag 100%, diffuse Subtotal occlusion of SVG-rPD  . Coronary angioplasty with stent placement  04/19/13    Stent-Promus DES (3.0 mm x 38 mm - 3.6 mm) to native RCA prox-mid,    ROS: A comprehensive was performed. Review of Systems  Constitutional: Negative for malaise/fatigue.  HENT: Negative for nosebleeds.   Eyes: Negative for blurred vision.  Respiratory: Negative for cough.   Cardiovascular: Negative  for claudication.  Gastrointestinal: Negative for blood in stool and melena.  Genitourinary: Negative for hematuria.  Musculoskeletal: Positive for joint pain. Negative for falls.  Neurological: Positive for dizziness (Less notable dizziness/orthostatic changes.).       Poor balance  Psychiatric/Behavioral: Negative.   All other systems reviewed and are negative.   Current Outpatient Prescriptions on File Prior to Visit  Medication Sig Dispense Refill  . atorvastatin (LIPITOR) 40 MG tablet Take 1 tablet (40 mg total) by mouth daily at 6 PM. 30 tablet 11  . clopidogrel (PLAVIX) 75 MG tablet TAKE 1 TABLET BY MOUTH EVERY DAY 90 tablet 2  . dutasteride (AVODART) 0.5 MG capsule Take 0.5 mg by mouth at bedtime.    . isosorbide mononitrate (IMDUR) 30 MG 24 hr tablet TAKE 1/2 TABLET BY MOUTH EVERY DAY 30 tablet 5  . metoprolol tartrate (LOPRESSOR) 25 MG tablet Take 0.5 tablets (12.5 mg total) by mouth 2 (two) times daily. 30 tablet 8  . midodrine (PROAMATINE) 10 MG tablet TAKE 1 TABLET BY MOUTH THREE TIMES DAILY WITH MEALS 90 tablet 10  . nitroGLYCERIN (NITROSTAT) 0.4 MG SL tablet Place 1 tablet (0.4 mg total) under the tongue every 5 (five) minutes as needed for chest pain. 25 tablet 3  . Polyvinyl Alcohol-Povidone (REFRESH OP) Place 1 drop into both eyes daily as needed.    Joshua Chambers PULMICORT FLEXHALER 180 MCG/ACT inhaler Place 2 puffs into the nose daily.    . ranitidine (ZANTAC) 150 MG tablet Take 150 mg by mouth 2 (two) times daily as needed for heartburn.      No current facility-administered medications on file prior to visit.   Allergies  Allergen Reactions  . Amiodarone Other (See Comments)    Extreme dehydration  . Diltiazem Other (See Comments)    Pt unsure of reaction  . Sulfa Antibiotics Other (See Comments)    Pt unsure of reaction    Social History   Social History  . Marital Status: Married    Spouse Name: N/A  . Number of Children: N/A  . Years of Education: N/A    Occupational History  . Not on file.   Social History Main Topics  . Smoking status: Never Smoker   . Smokeless tobacco: Never Used  . Alcohol Use: No  . Drug Use: No  . Sexual Activity: Not on file   Other Topics Concern  . Not on file   Social History Narrative    He is now remarried for the last 2 years, his first wife of 60 years is deceased. He has 1   child from that first marriage and 2 grandchildren.    He does not smoke, does not drink.    He is not very active but he does kind of meander around with his  new wife who is permanently disabled.   family history is not on file. -- non-contributory   Wt Readings from Last 3 Encounters:  07/28/15 207 lb (93.895 kg)  01/28/15 208 lb 9.6 oz (94.62 kg)  07/31/14 211 lb (95.709 kg)    PHYSICAL EXAM BP 110/70 mmHg  Pulse 76  Ht 6\' 3"  (1.905 m)  Wt 207 lb (93.895 kg)  BMI 25.87 kg/m2 General appearance: Alert and oriented x3 He answers questions appropriately. He does not appear to be in acute distress today. Relatively healthy appearing and well-nourished/well-groomed.  Neck: Supple, no adenopathy, no carotid bruit or JVD Lungs: CTA B., normal percussion bilaterally and no wheezes rales or rhonchi. Heart: regular rate and rhythm, S1, S2 normal, no murmur, click, rub or gallop, normal apical impulse and Occasional ectopy Abdomen: soft, non-tender; bowel sounds normal; no masses, no organomegaly Extremities: extremities normal, atraumatic, no cyanosis or edema, no edema, Pulses: 2+ and symmetric Neurologic: Alert and oriented X 3, normal strength and tone. Normal symmetric reflexes. Normal coordination and gait is somewhat unsteady, as he uses a cane for support. CN II through XII grossly intact   Adult ECG Report  Rate: 76;  Rhythm: normal sinus rhythm and premature atrial contractions (PAC) in Bigeminy pattern  Narrative Interpretation: Otherwise normal EKG. - stable   Other studies Reviewed: Additional studies/  records that were reviewed today include:  Recent Labs:  No recent labs   ASSESSMENT / PLAN: Problem List Items Addressed This Visit    Premature atrial contractions (Chronic)    Asymptomatic, but relatively frequent. Would maintain treatment with low-dose beta blocker. I don't want to titrate up further because of his orthostatic hypotension.      Essential hypertension (Chronic)    Allowing for permissive hypertension a stone his history of orthostatic hypotension. He is on Midrin, and blood pressure is only 110/70. Maintaining low-dose beta blocker based on his PACs on EKG.      Relevant Orders   EKG 12-Lead   Dyslipidemia, goal LDL below 70 (Chronic)   Relevant Orders   EKG 12-Lead   Coronary artery disease involving coronary bypass graft of native heart without angina pectoris; CAD (coronary artery disease) of artery bypass graft:  loss of VG to RCA and VG-Diag - Primary (Chronic)    LIMA LAD patent but otherwise native vessels were occluded. At this point I would consider this to be routine native vessel follow-up. Would not recommend screening follow-up Myoview unless he has symptoms.      Relevant Orders   EKG 12-Lead   CAD S/P percutaneous coronary angioplasty - PCI of native RCA; Promus DES 3.0 mm x38 mm (3.6 mm) (Chronic)    No recurrent anginal symptoms. Has not had use any when necessary nitroglycerin. He is on Plavix without aspirin. On low-dose beta blocker - not titrate up secondary to orthostatic dizziness on Midrin currently. He is on statin with LAD a monitored by PCP. At this point, unless she has any symptoms, would not recommend any screening test unless he does.         Current medicines are reviewed at length with the patient today. (+/- concerns) n/a The following changes have been made: n/a   NO CHANGE IN MEDICATIONS  Your physician wants you to follow-up in 6 MONTHS DR Mally Gavina.   , M.D., M.S. Interventional Cardiologist    Pager # [phone]

## 2015-08-05 ENCOUNTER — Telehealth: Payer: Self-pay | Admitting: Cardiology

## 2015-08-05 NOTE — Telephone Encounter (Signed)
1. What dental office are you calling from? Dr. Burr Medico  2. What is your office phone and fax number? Office- [phone]   3. What type of procedure is the patient having performed? Tooth extraction  4. What date is procedure scheduled? Pending   5. What is your question (ex. Antibiotics prior to procedure, holding medication-we need to know how long dentist wants pt to hold med)? Plavix   6.

## 2015-08-05 NOTE — Telephone Encounter (Signed)
Okay to hold Plavix. Does not need antibiotics. Hold Plavix 5 days preprocedure  HARDING, Piedad Climes, MD

## 2015-08-05 NOTE — Telephone Encounter (Signed)
Will forward for dr harding review and advise

## 2015-08-06 NOTE — Telephone Encounter (Signed)
Called Laureen at Dr. Jon Gills [facility]'s office ([facility] [phone]) Gave her the message from Dr. Herbie Baltimore that is is okay to hold Plavix. Does not need antibiotics. Hold Plavix 5 days preprocedure

## 2015-08-11 ENCOUNTER — Other Ambulatory Visit: Payer: Self-pay | Admitting: Cardiology

## 2015-08-11 NOTE — Telephone Encounter (Signed)
Rx(s) sent to pharmacy electronically.  

## 2015-08-20 DIAGNOSIS — L02413 Cutaneous abscess of right upper limb: Secondary | ICD-10-CM | POA: Diagnosis not present

## 2015-09-02 ENCOUNTER — Other Ambulatory Visit: Payer: Self-pay | Admitting: Cardiovascular Disease

## 2015-09-02 NOTE — Telephone Encounter (Signed)
Rx(s) sent to pharmacy electronically.  

## 2015-11-03 ENCOUNTER — Other Ambulatory Visit: Payer: Self-pay | Admitting: Cardiology

## 2015-11-03 NOTE — Telephone Encounter (Signed)
Rx request sent to pharmacy.  

## 2015-12-03 ENCOUNTER — Encounter (HOSPITAL_COMMUNITY): Payer: Self-pay

## 2015-12-03 ENCOUNTER — Emergency Department (HOSPITAL_COMMUNITY): Payer: Medicare Other

## 2015-12-03 ENCOUNTER — Emergency Department (HOSPITAL_COMMUNITY)
Admission: EM | Admit: 2015-12-03 | Discharge: 2015-12-03 | Disposition: A | Discharge status: Home / Self Care | Payer: Medicare Other | Attending: Emergency Medicine | Admitting: Emergency Medicine

## 2015-12-03 DIAGNOSIS — R202 Paresthesia of skin: Secondary | ICD-10-CM | POA: Insufficient documentation

## 2015-12-03 DIAGNOSIS — Z8673 Personal history of transient ischemic attack (TIA), and cerebral infarction without residual deficits: Secondary | ICD-10-CM | POA: Diagnosis not present

## 2015-12-03 DIAGNOSIS — Z9889 Other specified postprocedural states: Secondary | ICD-10-CM | POA: Diagnosis not present

## 2015-12-03 DIAGNOSIS — R29898 Other symptoms and signs involving the musculoskeletal system: Secondary | ICD-10-CM

## 2015-12-03 DIAGNOSIS — Z87442 Personal history of urinary calculi: Secondary | ICD-10-CM | POA: Insufficient documentation

## 2015-12-03 DIAGNOSIS — Z8739 Personal history of other diseases of the musculoskeletal system and connective tissue: Secondary | ICD-10-CM | POA: Insufficient documentation

## 2015-12-03 DIAGNOSIS — E785 Hyperlipidemia, unspecified: Secondary | ICD-10-CM | POA: Insufficient documentation

## 2015-12-03 DIAGNOSIS — Z86718 Personal history of other venous thrombosis and embolism: Secondary | ICD-10-CM | POA: Diagnosis not present

## 2015-12-03 DIAGNOSIS — I2510 Atherosclerotic heart disease of native coronary artery without angina pectoris: Secondary | ICD-10-CM | POA: Insufficient documentation

## 2015-12-03 DIAGNOSIS — R200 Anesthesia of skin: Secondary | ICD-10-CM | POA: Diagnosis not present

## 2015-12-03 DIAGNOSIS — Z9861 Coronary angioplasty status: Secondary | ICD-10-CM | POA: Insufficient documentation

## 2015-12-03 DIAGNOSIS — Z7902 Long term (current) use of antithrombotics/antiplatelets: Secondary | ICD-10-CM | POA: Diagnosis not present

## 2015-12-03 DIAGNOSIS — Z8719 Personal history of other diseases of the digestive system: Secondary | ICD-10-CM | POA: Diagnosis not present

## 2015-12-03 DIAGNOSIS — Z79899 Other long term (current) drug therapy: Secondary | ICD-10-CM | POA: Insufficient documentation

## 2015-12-03 DIAGNOSIS — I6789 Other cerebrovascular disease: Secondary | ICD-10-CM | POA: Diagnosis not present

## 2015-12-03 DIAGNOSIS — Z951 Presence of aortocoronary bypass graft: Secondary | ICD-10-CM | POA: Diagnosis not present

## 2015-12-03 LAB — BASIC METABOLIC PANEL
ANION GAP: 8 (ref 5–15)
BUN: 20 mg/dL (ref 6–20)
CO2: 26 mmol/L (ref 22–32)
Calcium: 9.3 mg/dL (ref 8.9–10.3)
Chloride: 107 mmol/L (ref 101–111)
Creatinine, Ser: 0.91 mg/dL (ref 0.61–1.24)
Glucose, Bld: 103 mg/dL — ABNORMAL HIGH (ref 65–99)
POTASSIUM: 4.6 mmol/L (ref 3.5–5.1)
SODIUM: 141 mmol/L (ref 135–145)

## 2015-12-03 LAB — CBC WITH DIFFERENTIAL/PLATELET
BASOS ABS: 0 10*3/uL (ref 0.0–0.1)
BASOS PCT: 0 %
EOS ABS: 0.1 10*3/uL (ref 0.0–0.7)
EOS PCT: 1 %
HCT: 36.4 % — ABNORMAL LOW (ref 39.0–52.0)
Hemoglobin: 11.7 g/dL — ABNORMAL LOW (ref 13.0–17.0)
Lymphocytes Relative: 15 %
Lymphs Abs: 0.8 10*3/uL (ref 0.7–4.0)
MCH: 30.2 pg (ref 26.0–34.0)
MCHC: 32.1 g/dL (ref 30.0–36.0)
MCV: 94.1 fL (ref 78.0–100.0)
Monocytes Absolute: 0.5 10*3/uL (ref 0.1–1.0)
Monocytes Relative: 9 %
Neutro Abs: 4.3 10*3/uL (ref 1.7–7.7)
Neutrophils Relative %: 75 %
PLATELETS: 258 10*3/uL (ref 150–400)
RBC: 3.87 MIL/uL — AB (ref 4.22–5.81)
RDW: 12.8 % (ref 11.5–15.5)
WBC: 5.7 10*3/uL (ref 4.0–10.5)

## 2015-12-03 MED ORDER — DIAZEPAM 2 MG PO TABS
2.0000 mg | ORAL_TABLET | Freq: Once | ORAL | Status: AC
  Administered 2015-12-03: 2 mg via ORAL
  Filled 2015-12-03: qty 1

## 2015-12-03 NOTE — ED Notes (Signed)
Pt presents with onset of L hand numbness while sitting outside in the shade at 1130 today.  Pt reports numbness radiated up L arm, reports frontal headache after numbness started.  Pt reports dizziness.  On arrival, pt reports numbness has subsided in L arm, reports tingling in L fingers.

## 2015-12-03 NOTE — ED Notes (Signed)
Joshua Chambers verbalized understanding of d/c instructions and has no further questions. Joshua Chambers stable and NAD. Neuro intact. Joshua Chambers to follow up with Dr Kevan Ny.

## 2015-12-03 NOTE — ED Provider Notes (Signed)
CSN: [id number]     Arrival date & time 12/03/15  1318 History   First MD Initiated Contact with Patient 12/03/15 1323     No chief complaint on file.    (Consider location/radiation/quality/duration/timing/severity/associated sxs/prior Treatment) HPI Comments: Patient here after having a 30 minute episode of left upper extremity paresthesias characterized as numbness starting in his left hand extending up to the mid forearm. Symptoms resolved spontaneously. No associated weakness in his arms or legs, no confusion. No severe headaches or neck discomfort. No chest pain or shortness of breath or diaphoresis. Does have a history of TIA in the past but this is different. Currently feels back to his baseline.  The history is provided by the patient and the spouse.    Past Medical History  Diagnosis Date  . CAD (coronary artery disease), native coronary artery 2007    Multivessel CAD referred for CABG  . S/P CABG x 3 2007    LIMA-LAD, SVG-DIAG, SVG-RCA  . CAD (coronary artery disease), autologous vein bypass graft 04/19/2013    100% flush occlusion of SVG-OM & SVG-Diag; near Subtotal Occlusion of SVG-RCA (PCI of RCA done to avoid distal embolization with PCI to SVG)  . CAD S/P percutaneous coronary angioplasty  04/19/2013    PCI of Native RCA - Promus Premier DES 3.0 mm x 38 mm (3.6 mm);   [name] History of stroke August 2011    Possible TIA in 2001;  Echo: EF 45-50%, mild HK of anterior septum.  [name] Hx of orthostatic hypotension 2009    Positive tilt table:  dizziness/vasopressor type; previously on the decision   . Dyslipidemia, goal LDL below 70   . Hx SBO   . History of DVT of lower extremity     Right popliteal vein  . Nephrolithiasis   . Arthritis   . Unsteady gait    Past Surgical History  Procedure Laterality Date  . Coronary artery bypass graft  2007    LIMA-LAD, SVG-D1, SVG-RCA  . Appendectomy    . Tonsillectomy    . Colon surgery    . Left heart catheterization with  coronary/graft angiogram Left 04/19/2013    LEFT HEART CATH W/ CORONARY/GRAFT ANGIOGRAM;  [name], MD;  Location: [facility] CATH LAB;  LM 20%-->LAD 70-80% then subTO after D1 (Patent LIMA-LAD); Small non-dom Cx ~20% ostial.  RCA long 60-70% prox --> 90% mid.  SVG-OM & SVG-Diag 100%, diffuse Subtotal occlusion of SVG-rPD  . Coronary angioplasty with stent placement  04/19/13    Stent-Promus DES (3.0 mm x 38 mm - 3.6 mm) to native RCA prox-mid,   History reviewed. No pertinent family history. Social History  Substance Use Topics  . Smoking status: Never Smoker   . Smokeless tobacco: Never Used  . Alcohol Use: No    Review of Systems  All other systems reviewed and are negative.     Allergies  Amiodarone; Diltiazem; and Sulfa antibiotics  Home Medications   Prior to Admission medications   Medication Sig Start Date End Date Taking? Authorizing Provider  atorvastatin (LIPITOR) 40 MG tablet TAKE 1 TABLET BY MOUTH DAILY AT 6:00 PM 08/11/15   [name], MD  clopidogrel (PLAVIX) 75 MG tablet TAKE 1 TABLET BY MOUTH EVERY DAY 04/06/15   [name], MD  dutasteride (AVODART) 0.5 MG capsule Take 0.5 mg by mouth at bedtime.    Historical Provider, MD  isosorbide mononitrate (IMDUR) 30 MG 24 hr tablet TAKE 1/2 TABLET BY MOUTH EVERY DAY  04/27/15   [name], MD  metoprolol tartrate (LOPRESSOR) 25 MG tablet TAKE 1/2 TABLET(12.5 MG) BY MOUTH TWICE DAILY 09/02/15   [name], MD  midodrine (PROAMATINE) 10 MG tablet TAKE 1 TABLET BY MOUTH THREE TIMES DAILY WITH MEALS 11/03/15   [name], MD  nitroGLYCERIN (NITROSTAT) 0.4 MG SL tablet Place 1 tablet (0.4 mg total) under the tongue every 5 (five) minutes as needed for chest pain. 04/17/13   [name], MD  Polyvinyl Alcohol-Povidone (REFRESH OP) Place 1 drop into both eyes daily as needed.    Historical Provider, MD  PULMICORT FLEXHALER 180 MCG/ACT inhaler Place 2 puffs into the nose daily. 07/24/14   Historical Provider, MD   ranitidine (ZANTAC) 150 MG tablet Take 150 mg by mouth 2 (two) times daily as needed for heartburn.     Historical Provider, MD   BP 134/98 mmHg  Pulse 78  Temp(Src) 97.8 F (36.6 C) (Oral)  Resp 17  Ht 6\' 3"  (1.905 m)  Wt 96.616 kg  BMI 26.62 kg/m2  SpO2 94% Physical Exam  Constitutional: He is oriented to person, place, and time. He appears well-developed and well-nourished.  Non-toxic appearance. No distress.  HENT:  Head: Normocephalic and atraumatic.  Eyes: Conjunctivae, EOM and lids are normal. Pupils are equal, round, and reactive to light.  Neck: Normal range of motion. Neck supple. No tracheal deviation present. No thyroid mass present.  Cardiovascular: Normal rate, regular rhythm and normal heart sounds.  Exam reveals no gallop.   No murmur heard. Pulmonary/Chest: Effort normal and breath sounds normal. No stridor. No respiratory distress. He has no decreased breath sounds. He has no wheezes. He has no rhonchi. He has no rales.  Abdominal: Soft. Normal appearance and bowel sounds are normal. He exhibits no distension. There is no tenderness. There is no rebound and no CVA tenderness.  Musculoskeletal: Normal range of motion. He exhibits no edema or tenderness.  Neurological: He is alert and oriented to person, place, and time. He has normal strength. No cranial nerve deficit or sensory deficit. GCS eye subscore is 4. GCS verbal subscore is 5. GCS motor subscore is 6.  Skin: Skin is warm and dry. No abrasion and no rash noted.  Psychiatric: He has a normal mood and affect. His speech is normal and behavior is normal.  Nursing note and vitals reviewed.   ED Course  Procedures (including critical care time) Labs Review Labs Reviewed - No data to display  Imaging Review No results found. I have personally reviewed and evaluated these images and lab results as part of my medical decision-making.   EKG Interpretation   Date/Time:  Thursday Dec 03 2015 13:25:07  EDT Ventricular Rate:  79 PR Interval:  136 QRS Duration: 108 QT Interval:  414 QTC Calculation: 475 R Axis:   48 Text Interpretation:  Sinus or ectopic atrial rhythm Supraventricular  bigeminy Incomplete left bundle branch block Baseline wander in lead(s) II  No significant change since last tracing Confirmed by Walther Sanagustin  MD, Demesha Boorman  ([id number]) on 12/03/2015 2:52:23 PM      MDM   Final diagnoses:  None    Patient symptoms are totally resolved at this time. MRI is pending and have signed patient not to Dr. 12/05/2015. Will consult neurology.    Juleen China, MD 12/03/15 [phone]

## 2015-12-03 NOTE — ED Provider Notes (Signed)
82 year old male with left upper extremity numbness from hand to forearm. Motion degraded images, but MRI without acute abnormality. Electrolytes are fine. He is hemodynamically stable. He continues to remain asymptomatic. Last carotid Dopplers appear to be from 2013, but at that time did not have any flow-limiting disease. He is already on Plavix. His blood pressure is good. He is on a statin. At this time, I feel he is appropriate for discharge. I do not feel that he requires further testing or hospitalization but this certainly can be readdressed if symptoms recur. Outpatient follow-up.  Raeford Razor, MD 12/03/15 [phone]

## 2015-12-08 DIAGNOSIS — R202 Paresthesia of skin: Secondary | ICD-10-CM | POA: Diagnosis not present

## 2015-12-08 DIAGNOSIS — I2510 Atherosclerotic heart disease of native coronary artery without angina pectoris: Secondary | ICD-10-CM | POA: Diagnosis not present

## 2015-12-08 DIAGNOSIS — J449 Chronic obstructive pulmonary disease, unspecified: Secondary | ICD-10-CM | POA: Diagnosis not present

## 2015-12-23 ENCOUNTER — Other Ambulatory Visit: Payer: Self-pay | Admitting: Cardiology

## 2015-12-23 NOTE — Telephone Encounter (Signed)
Rx(s) sent to pharmacy electronically.  

## 2016-01-10 ENCOUNTER — Other Ambulatory Visit: Payer: Self-pay | Admitting: Cardiology

## 2016-01-11 NOTE — Telephone Encounter (Signed)
Rx(s) sent to pharmacy electronically.  

## 2016-01-31 ENCOUNTER — Other Ambulatory Visit: Payer: Self-pay | Admitting: Cardiology

## 2016-02-01 ENCOUNTER — Ambulatory Visit (INDEPENDENT_AMBULATORY_CARE_PROVIDER_SITE_OTHER): Payer: Medicare Other | Admitting: Cardiology

## 2016-02-01 ENCOUNTER — Encounter: Payer: Self-pay | Admitting: Cardiology

## 2016-02-01 VITALS — BP 100/60 | HR 74 | Ht 75.0 in | Wt 202.0 lb

## 2016-02-01 DIAGNOSIS — I951 Orthostatic hypotension: Secondary | ICD-10-CM | POA: Diagnosis not present

## 2016-02-01 DIAGNOSIS — I25810 Atherosclerosis of coronary artery bypass graft(s) without angina pectoris: Secondary | ICD-10-CM | POA: Diagnosis not present

## 2016-02-01 DIAGNOSIS — I2510 Atherosclerotic heart disease of native coronary artery without angina pectoris: Secondary | ICD-10-CM | POA: Diagnosis not present

## 2016-02-01 DIAGNOSIS — I491 Atrial premature depolarization: Secondary | ICD-10-CM

## 2016-02-01 DIAGNOSIS — Z9861 Coronary angioplasty status: Secondary | ICD-10-CM | POA: Diagnosis not present

## 2016-02-01 DIAGNOSIS — I10 Essential (primary) hypertension: Secondary | ICD-10-CM | POA: Diagnosis not present

## 2016-02-01 DIAGNOSIS — E785 Hyperlipidemia, unspecified: Secondary | ICD-10-CM | POA: Diagnosis not present

## 2016-02-01 NOTE — Patient Instructions (Signed)
Your physician has recommended you make the following change in your medication.   For the next week take only the evening dose of the metoprolol. ( lopressor) after 1 week then STOP.  Your physician wants you to follow-up in: 6 months or sooner if needed. You will receive a reminder letter in the mail two months in advance. If you don't receive a letter, please call our office to schedule the follow-up appointment.  If you need a refill on your cardiac medications before your next appointment, please call your pharmacy.

## 2016-02-01 NOTE — Telephone Encounter (Signed)
Rx(s) sent to pharmacy electronically.  

## 2016-02-01 NOTE — Progress Notes (Signed)
PCP: Pearla Dubonnet, MD  Clinic Note: Chief Complaint  Patient presents with  . Follow-up  . Coronary Artery Disease    HPI: Joshua Chambers is a 82 y.o. male with a PMH below who presents today for 6 month follow up of his CAD and other cardiac risk factors. He had a history of Multivessel CAD s/p CABG in 2007 with PCI of the Native RCA due to near occlusion of extensively disease SVG-RCA. A long segment was treated with a single Promus Premier DES 3.0 mm x 38 mm (post-dilated to 3.6 mm). Initially on Brilinta, he was converted to Plavix due to "gasping dsypena" intolerance. I saw him last year in January for exertional dyspnea -- with notable improvement in response to Inhales/Nebulizers. Gar Ponto was last seen in Jan 2017.  Recent Hospitalizations: Recent ER evaluation for left hand numbness. Ruled out stroke.  Studies Reviewed: n/a  Interval History: Joshua Chambers presents today doing well. He just smiles and says I'm just getting older and tired.  Still has the unsteady gait with significant balance issues. He says overall he has some good days and bad days, but really denies any significant cardiac symptoms of angina with rest or exertion. He does get some exertional dyspnea. He still tries today active however and goes out at least once a day with his wife. They've no out to eat which forces them to be up and out.  No chest pain or shortness of breath with rest or exertion. No PND, orthopnea or edema.  No palpitations, lightheadedness, dizziness, weakness or syncope/near syncope. No obvious TIA symptoms besides the one episode in the emergency room that ruled out for CVA. No melena, hematochezia, hematuria, or epstaxis. No claudication.  ROS: A comprehensive was performed. Review of Systems  Constitutional: Negative for fever and malaise/fatigue.  Respiratory: Negative for cough and shortness of breath.   Cardiovascular: Negative for claudication.  Gastrointestinal:  Negative for blood in stool and melena.  Genitourinary: Negative for hematuria.  Musculoskeletal: Positive for joint pain. Negative for myalgias and falls.       Legs hurt with walking.  Neurological: Negative for headaches.       Balance poor. Unsteady gait.  Psychiatric/Behavioral: Negative for memory loss. The patient is not nervous/anxious and does not have insomnia.   All other systems reviewed and are negative.    Past Medical History  Diagnosis Date  . CAD (coronary artery disease), native coronary artery 2007    Multivessel CAD referred for CABG  . S/P CABG x 3 2007    LIMA-LAD, SVG-DIAG, SVG-RCA  . CAD (coronary artery disease), autologous vein bypass graft 04/19/2013    100% flush occlusion of SVG-OM & SVG-Diag; near Subtotal Occlusion of SVG-RCA (PCI of RCA done to avoid distal embolization with PCI to SVG)  . CAD S/P percutaneous coronary angioplasty  04/19/2013    PCI of Native RCA - Promus Premier DES 3.0 mm x 38 mm (3.6 mm);   Joshua Chambers History of stroke August 2011    Possible TIA in 2001;  Echo: EF 45-50%, mild HK of anterior septum.  Joshua Chambers Hx of orthostatic hypotension 2009    Positive tilt table:  dizziness/vasopressor type; previously on the decision   . Dyslipidemia, goal LDL below 70   . Hx SBO   . History of DVT of lower extremity     Right popliteal vein  . Nephrolithiasis   . Arthritis   . Unsteady gait     Past  Surgical History  Procedure Laterality Date  . Coronary artery bypass graft  2007    LIMA-LAD, SVG-D1, SVG-RCA  . Appendectomy    . Tonsillectomy    . Colon surgery    . Left heart catheterization with coronary/graft angiogram Left 04/19/2013    LEFT HEART CATH W/ CORONARY/GRAFT ANGIOGRAM;  [name], MD;  Location: [facility] CATH LAB;  LM 20%-->LAD 70-80% then subTO after D1 (Patent LIMA-LAD); Small non-dom Cx ~20% ostial.  RCA long 60-70% prox --> 90% mid.  SVG-OM & SVG-Diag 100%, diffuse Subtotal occlusion of SVG-rPD  . Coronary angioplasty with stent  placement  04/19/13    Stent-Promus DES (3.0 mm x 38 mm - 3.6 mm) to native RCA prox-mid,    Prior to Admission medications   Medication Sig Start Date End Date Taking? Authorizing Provider  atorvastatin (LIPITOR) 40 MG tablet TAKE 1 TABLET BY MOUTH DAILY AT 6:00 PM 08/11/15  Yes [name], MD  clopidogrel (PLAVIX) 75 MG tablet TAKE 1 TABLET BY MOUTH EVERY DAY 01/11/16  Yes [name], MD  dutasteride (AVODART) 0.5 MG capsule Take 0.5 mg by mouth at bedtime.   Yes Historical Provider, MD  isosorbide mononitrate (IMDUR) 30 MG 24 hr tablet TAKE 1/2 TABLET BY MOUTH EVERY DAY 04/27/15  Yes [name], MD  metoprolol tartrate (LOPRESSOR) 25 MG tablet TAKE 1/2 TABLET(12.5 MG) BY MOUTH TWICE DAILY 09/02/15  Yes [name], MD  midodrine (PROAMATINE) 10 MG tablet TAKE 1 TABLET BY MOUTH THREE TIMES DAILY WITH MEALS 12/23/15  Yes [name], MD  nitroGLYCERIN (NITROSTAT) 0.4 MG SL tablet Place 1 tablet (0.4 mg total) under the tongue every 5 (five) minutes as needed for chest pain. 04/17/13  Yes [name], MD  Polyvinyl Alcohol-Povidone (REFRESH OP) Place 1 drop into both eyes daily as needed.   Yes Historical Provider, MD  PULMICORT FLEXHALER 180 MCG/ACT inhaler Inhale 2 puffs into the lungs daily.  07/24/14  Yes Historical Provider, MD  ranitidine (ZANTAC) 150 MG tablet Take 150 mg by mouth 2 (two) times daily as needed for heartburn.    Yes Historical Provider, MD   Allergies  Allergen Reactions  . Amiodarone Other (See Comments)    Extreme dehydration  . Diltiazem Other (See Comments)    Pt unsure of reaction  . Sulfa Antibiotics Other (See Comments)    Pt unsure of reaction    Social History   Social History  . Marital Status: Married    Spouse Name: N/A  . Number of Children: N/A  . Years of Education: N/A   Social History Main Topics  . Smoking status: Never Smoker   . Smokeless tobacco: Never Used  . Alcohol Use: No  . Drug Use: No  . Sexual Activity: Not  Asked   Other Topics Concern  . None   Social History Narrative    He is now remarried for the last 2 years, his first wife of 60 years is deceased. He has 1   child from that first marriage and 2 grandchildren.    He does not smoke, does not drink.    He is not very active but he does kind of meander around with his new wife who is permanently disabled.    History reviewed. No pertinent family history.   Wt Readings from Last 3 Encounters:  02/01/16 202 lb (91.627 kg)  12/03/15 213 lb (96.616 kg)  07/28/15 207 lb (93.895 kg)    PHYSICAL  EXAM BP 100/60 mmHg  Pulse 74  Ht 6\' 3"  (1.905 m)  Wt 202 lb (91.627 kg)  BMI 25.25 kg/m2  SpO2 96% General appearance: Alert and oriented x3 He answers questions appropriately. He does not appear to be in acute distress today. Relatively healthy appearing and well-nourished/well-groomed.  Neck: Supple, no adenopathy, no carotid bruit or JVD Lungs: CTA B., normal percussion bilaterally and no wheezes rales or rhonchi. Heart: regular rate and rhythm, S1, S2 normal, no murmur, click, rub or gallop, normal apical impulse and Occasional ectopy Abdomen: soft, non-tender; bowel sounds normal; no masses, no organomegaly Extremities: extremities normal, atraumatic, no cyanosis or edema, no edema, Pulses: 2+ and symmetric Neurologic: Alert and oriented X 3, normal strength and tone. Normal symmetric reflexes. Normal coordination and gait is somewhat unsteady, as he uses a cane for support. CN II through XII grossly intact  Adult ECG Report - n/a  Other studies Reviewed: Additional studies/ records that were reviewed today include:  Recent Labs:  n/a     ASSESSMENT / PLAN: Problem List Items Addressed This Visit    Premature atrial contractions (Chronic)    Asymptomatic. We'll see how they go being off beta blocker.      Orthostatic hypotension    No significant improvement on Midodrine, but has not had any falls. I think a lot of this is  probably neurologic in not blood pressure were dilated, however with the orthostatic symptoms and his blood pressure being low today, I think we can just simply stop the beta blocker all together.      Essential hypertension (Chronic)    Actually borderline hypotensive. Now would be to allow for permissive hypertension of possible Only safe to stop beta blocker now as he is no longer noticing any palpitations.      Dyslipidemia, goal LDL below 70 (Chronic)    Continues to be on statin. Monitored by PCP. No myalgias or memory loss issues. I have not seen any recent labs.      Coronary artery disease involving coronary bypass graft of native heart without angina pectoris; CAD (coronary artery disease) of artery bypass graft:  loss of VG to RCA and VG-Diag - Primary (Chronic)    Based on age and lack of symptoms, I think we can hold off on follow-up stress test evaluation unless he has symptoms.      CAD S/P percutaneous coronary angioplasty - PCI of native RCA; Promus DES 3.0 mm x38 mm (3.6 mm) (Chronic)    Doing well without any active angina symptoms. After the most recent catheterization with stent, he is quite familiar now with his symptoms and has not noted any. Remains on Plavix alone. On Imdur, beta blocker and statin. However with his dizziness is using blood pressure being a little low, I think we can simply stop the low-dose beta blocker to avoid hypotension.         Current medicines are reviewed at length with the patient today. (+/- concerns) None The following changes have been made:  Wean off beta blocker. For one week take evening dose only and then stop.   Studies Ordered:   No orders of the defined types were placed in this encounter.    ROV 6 months     Bryan Lemma, M.D., M.S. Interventional Cardiologist   Pager # [phone] Phone # [phone] [address] [place], [place] [address]

## 2016-02-03 ENCOUNTER — Encounter: Payer: Self-pay | Admitting: Cardiology

## 2016-02-03 NOTE — Assessment & Plan Note (Signed)
No significant improvement on Midodrine, but has not had any falls. I think a lot of this is probably neurologic in not blood pressure were dilated, however with the orthostatic symptoms and his blood pressure being low today, I think we can just simply stop the beta blocker all together.

## 2016-02-03 NOTE — Assessment & Plan Note (Signed)
Actually borderline hypotensive. Now would be to allow for permissive hypertension of possible Only safe to stop beta blocker now as he is no longer noticing any palpitations.

## 2016-02-03 NOTE — Assessment & Plan Note (Signed)
Doing well without any active angina symptoms. After the most recent catheterization with stent, he is quite familiar now with his symptoms and has not noted any. Remains on Plavix alone. On Imdur, beta blocker and statin. However with his dizziness is using blood pressure being a little low, I think we can simply stop the low-dose beta blocker to avoid hypotension.

## 2016-02-03 NOTE — Assessment & Plan Note (Signed)
Continues to be on statin. Monitored by PCP. No myalgias or memory loss issues. I have not seen any recent labs.

## 2016-02-03 NOTE — Assessment & Plan Note (Signed)
Asymptomatic. We'll see how they go being off beta blocker.

## 2016-02-03 NOTE — Assessment & Plan Note (Signed)
Based on age and lack of symptoms, I think we can hold off on follow-up stress test evaluation unless he has symptoms.

## 2016-02-19 ENCOUNTER — Other Ambulatory Visit: Payer: Self-pay | Admitting: Cardiology

## 2016-02-19 NOTE — Telephone Encounter (Signed)
Rx(s) sent to pharmacy electronically.  

## 2016-03-06 ENCOUNTER — Other Ambulatory Visit: Payer: Self-pay | Admitting: Cardiology

## 2016-03-07 NOTE — Telephone Encounter (Signed)
Rx request sent to pharmacy.  

## 2016-04-12 DIAGNOSIS — N400 Enlarged prostate without lower urinary tract symptoms: Secondary | ICD-10-CM | POA: Diagnosis not present

## 2016-04-12 DIAGNOSIS — Z79899 Other long term (current) drug therapy: Secondary | ICD-10-CM | POA: Diagnosis not present

## 2016-04-12 DIAGNOSIS — R29898 Other symptoms and signs involving the musculoskeletal system: Secondary | ICD-10-CM | POA: Diagnosis not present

## 2016-04-12 DIAGNOSIS — Z1389 Encounter for screening for other disorder: Secondary | ICD-10-CM | POA: Diagnosis not present

## 2016-04-12 DIAGNOSIS — Z23 Encounter for immunization: Secondary | ICD-10-CM | POA: Diagnosis not present

## 2016-04-12 DIAGNOSIS — J449 Chronic obstructive pulmonary disease, unspecified: Secondary | ICD-10-CM | POA: Diagnosis not present

## 2016-04-12 DIAGNOSIS — Z0001 Encounter for general adult medical examination with abnormal findings: Secondary | ICD-10-CM | POA: Diagnosis not present

## 2016-04-12 DIAGNOSIS — R311 Benign essential microscopic hematuria: Secondary | ICD-10-CM | POA: Diagnosis not present

## 2016-04-12 DIAGNOSIS — I951 Orthostatic hypotension: Secondary | ICD-10-CM | POA: Diagnosis not present

## 2016-04-12 DIAGNOSIS — K219 Gastro-esophageal reflux disease without esophagitis: Secondary | ICD-10-CM | POA: Diagnosis not present

## 2016-04-12 DIAGNOSIS — I2510 Atherosclerotic heart disease of native coronary artery without angina pectoris: Secondary | ICD-10-CM | POA: Diagnosis not present

## 2016-04-12 DIAGNOSIS — E782 Mixed hyperlipidemia: Secondary | ICD-10-CM | POA: Diagnosis not present

## 2016-04-20 ENCOUNTER — Other Ambulatory Visit: Payer: Self-pay | Admitting: Cardiology

## 2016-05-20 ENCOUNTER — Other Ambulatory Visit: Payer: Self-pay | Admitting: Cardiology

## 2016-05-23 ENCOUNTER — Other Ambulatory Visit: Payer: Self-pay | Admitting: Cardiology

## 2016-05-23 NOTE — Telephone Encounter (Signed)
isosorbide mononitrate (IMDUR) 30 MG 24 hr tablet  Medication  Date: 02/19/2016 Department: [facility] Ordering/Authorizing: [name], MD  Order Providers   Prescribing Provider Encounter Provider  [name], MD [name], MD  Medication Detail    Disp Refills Start End   isosorbide mononitrate (IMDUR) 30 MG 24 hr tablet 45 tablet 3 02/19/2016    Sig: TAKE 1/2 TABLET BY MOUTH EVERY DAY   Notes to Pharmacy: **Patient requests 90 days supply**   E-Prescribing Status: Receipt confirmed by pharmacy (02/19/2016 2:49 PM EDT)   Pharmacy   [id number] - [place], [place] - [address] AT [facility] OF [address]

## 2016-06-05 ENCOUNTER — Other Ambulatory Visit: Payer: Self-pay | Admitting: Cardiology

## 2016-07-31 ENCOUNTER — Other Ambulatory Visit: Payer: Self-pay | Admitting: Cardiology

## 2016-08-01 NOTE — Telephone Encounter (Signed)
Rx(s) sent to pharmacy electronically.  

## 2016-08-19 DIAGNOSIS — N401 Enlarged prostate with lower urinary tract symptoms: Secondary | ICD-10-CM | POA: Diagnosis not present

## 2016-08-19 DIAGNOSIS — R351 Nocturia: Secondary | ICD-10-CM | POA: Diagnosis not present

## 2016-08-19 DIAGNOSIS — R310 Gross hematuria: Secondary | ICD-10-CM | POA: Diagnosis not present

## 2016-08-21 ENCOUNTER — Other Ambulatory Visit: Payer: Self-pay | Admitting: Cardiology

## 2016-09-09 ENCOUNTER — Ambulatory Visit (INDEPENDENT_AMBULATORY_CARE_PROVIDER_SITE_OTHER): Payer: Medicare Other | Admitting: Student

## 2016-09-09 ENCOUNTER — Encounter: Payer: Self-pay | Admitting: Student

## 2016-09-09 VITALS — BP 131/86 | HR 56 | Ht 75.0 in | Wt 198.6 lb

## 2016-09-09 DIAGNOSIS — I25810 Atherosclerosis of coronary artery bypass graft(s) without angina pectoris: Secondary | ICD-10-CM | POA: Diagnosis not present

## 2016-09-09 DIAGNOSIS — E785 Hyperlipidemia, unspecified: Secondary | ICD-10-CM

## 2016-09-09 DIAGNOSIS — I498 Other specified cardiac arrhythmias: Secondary | ICD-10-CM

## 2016-09-09 DIAGNOSIS — I499 Cardiac arrhythmia, unspecified: Secondary | ICD-10-CM

## 2016-09-09 DIAGNOSIS — I951 Orthostatic hypotension: Secondary | ICD-10-CM | POA: Diagnosis not present

## 2016-09-09 HISTORY — DX: Other specified cardiac arrhythmias: I49.8

## 2016-09-09 NOTE — Patient Instructions (Signed)
Medication Instructions:  Continue current medications  Labwork: None Ordered  Testing/Procedures: None Ordered  Follow-Up: Your physician wants you to follow-up in: 6 Month with Dr Herbie Baltimore. You will receive a reminder letter in the mail two months in advance. If you don't receive a letter, please call our office to schedule the follow-up appointment.   Any Other Special Instructions Will Be Listed Below (If Applicable).   If you need a refill on your cardiac medications before your next appointment, please call your pharmacy.

## 2016-09-09 NOTE — Progress Notes (Signed)
Cardiology Office Note    Date:  09/09/2016   ID:  Kristi, Hyer [birth year], MRN [id number]  PCP:  Henrine Screws, MD  Cardiologist: Dr. Ellyn Hack  Chief Complaint  Patient presents with  . Follow-up    History of Present Illness:    Joshua Chambers is a 83 y.o. male with past medical history of CAD (s/p CABG in 2007 with LIMA-LAD, SVG-D1, and SVG-RCA, flush occlusion of SVG-OM and SVG-D1 by cath in 2014, 99% occlusion of SVG-RCA with DES placed to native RCA at that time), HTN, HLD, orthostatic hypotension, and prior DVT who presents to the office today for 27-monthfollow-up.   Last seen by Dr. HEllyn Hackin 01/2016 reporting generalized fatigue and unsteady gait. Denied any specific chest discomfort or dyspnea with exertion.  He was continued on Plavix, Imdur, and statin therapy. BB therapy was discontinued due to reported episodes of dizziness and hypotension (BP 100/60 at the time of his office visit).   In talking with the patient today, he reports doing well from a cardiac perspective over the past 6 months. He denies any recent chest discomfort, dyspnea with exertion, orthopnea, PND, or lower extremity edema.   He does report having occasional numbness along his hands and feet bilaterally. Says this has been present for the past 3-4 years. At the time of initial onset of his symptoms, he went to the emergency department and was evaluated for stroke and says workup was negative at that time. He no longer drives due to his occasional leg numbness.  On examination, he is noted to have an irregular heartbeat. He reports checking his pulse occasionally at home and realizes it skips beats. Denies any associated palpitations or lightheadedness with these symptoms.  Reports good compliance with his medication regimen. Was listed as having stopped Lopressor at his last office visit. He has continued taking this and reports BP has remained stable.    Past Medical History:  Diagnosis  Date  . Arthritis   . CAD (coronary artery disease), autologous vein bypass graft 04/19/2013   100% flush occlusion of SVG-OM & SVG-Diag; near Subtotal Occlusion of SVG-RCA (PCI of RCA done to avoid distal embolization with PCI to SVG)  . CAD (coronary artery disease), native coronary artery 2007   Multivessel CAD referred for CABG  . CAD S/P percutaneous coronary angioplasty  04/19/2013   PCI of Native RCA - Promus Premier DES 3.0 mm x 38 mm (3.6 mm);   .Marland KitchenDyslipidemia, goal LDL below 70   . History of DVT of lower extremity    Right popliteal vein  . History of stroke August 2011   Possible TIA in 2001;  Echo: EF 45-50%, mild HK of anterior septum.  .Marland KitchenHx of orthostatic hypotension 2009   Positive tilt table:  dizziness/vasopressor type; previously on the decision   . Hx SBO   . Nephrolithiasis   . S/P CABG x 3 2007   LIMA-LAD, SVG-DIAG, SVG-RCA  . Unsteady gait     Past Surgical History:  Procedure Laterality Date  . APPENDECTOMY    . COLON SURGERY    . CORONARY ANGIOPLASTY WITH STENT PLACEMENT  04/19/13   Stent-Promus DES (3.0 mm x 38 mm - 3.6 mm) to native RCA prox-mid,  . CORONARY ARTERY BYPASS GRAFT  2007   LIMA-LAD, SVG-D1, SVG-RCA  . LEFT HEART CATHETERIZATION WITH CORONARY/GRAFT ANGIOGRAM Left 04/19/2013   LEFT HEART CATH W/ CORONARY/GRAFT ANGIOGRAM;  DLeonie Man MD;  [facility];  LM 20%-->LAD 70-80% then subTO after D1 (Patent LIMA-LAD); Small non-dom Cx ~20% ostial.  RCA long 60-70% prox --> 90% mid.  SVG-OM & SVG-Diag 100%, diffuse Subtotal occlusion of SVG-rPD  . TONSILLECTOMY      Current Medications: Outpatient Medications Prior to Visit  Medication Sig Dispense Refill  . atorvastatin (LIPITOR) 40 MG tablet TAKE 1 TABLET BY MOUTH DAILY AT 6:00 PM 90 tablet 0  . clopidogrel (PLAVIX) 75 MG tablet TAKE 1 TABLET BY MOUTH EVERY DAY 90 tablet 2  . dutasteride (AVODART) 0.5 MG capsule Take 0.5 mg by mouth at bedtime.    . isosorbide mononitrate (IMDUR) 30  MG 24 hr tablet TAKE 1/2 TABLET BY MOUTH EVERY DAY 45 tablet 3  . metoprolol tartrate (LOPRESSOR) 25 MG tablet TAKE 1/2 TABLET(12.5 MG) BY MOUTH TWICE DAILY 90 tablet 3  . midodrine (PROAMATINE) 10 MG tablet Take 1 tablet (10 mg total) by mouth 3 (three) times daily with meals. 90 tablet 11  . nitroGLYCERIN (NITROSTAT) 0.4 MG SL tablet Place 1 tablet (0.4 mg total) under the tongue every 5 (five) minutes as needed for chest pain. 25 tablet 3  . Polyvinyl Alcohol-Povidone (REFRESH OP) Place 1 drop into both eyes daily as needed.    [name] PULMICORT FLEXHALER 180 MCG/ACT inhaler Inhale 2 puffs into the lungs daily.     . ranitidine (ZANTAC) 150 MG tablet Take 150 mg by mouth 2 (two) times daily as needed for heartburn.     . isosorbide mononitrate (IMDUR) 30 MG 24 hr tablet TAKE 1/2 TABLET BY MOUTH EVERY DAY 30 tablet 2   No facility-administered medications prior to visit.      Allergies:   Amiodarone; Diltiazem; and Sulfa antibiotics   Social History   Social History  . Marital status: Married    Spouse name: N/A  . Number of children: N/A  . Years of education: N/A   Social History Main Topics  . Smoking status: Never Smoker  . Smokeless tobacco: Never Used  . Alcohol use No  . Drug use: No  . Sexual activity: Not Asked   Other Topics Concern  . None   Social History Narrative    He is now remarried for the last 2 years, his first wife of 36 years is deceased. He has 1   child from that first marriage and 2 grandchildren.    He does not smoke, does not drink.    He is not very active but he does kind of meander around with his new wife who is permanently disabled.      Family History:  The patient's family history includes Hypertension in his son.   Review of Systems:   Please see the history of present illness.     General:  No chills, fever, night sweats or weight changes.  Cardiovascular:  No chest pain, dyspnea on exertion, edema, orthopnea, palpitations, paroxysmal  nocturnal dyspnea. Dermatological: No rash, lesions/masses Respiratory: No cough, dyspnea Urologic: No hematuria, dysuria Abdominal:   No nausea, vomiting, diarrhea, bright red blood per rectum, melena, or hematemesis Neurologic:  No visual changes, wkns, changes in mental status. Positive for upper and lower extremity weakness.  All other systems reviewed and are otherwise negative except as noted above.   Physical Exam:    VS:  BP 131/86   Pulse (!) 56   Ht _0  (1.905 m)   Wt 198 lb 9.6 oz (90.1 kg)   BMI 24.82 kg/m    General:  Well developed, elderly Caucasian male appearing in no acute distress. Head: Normocephalic, atraumatic, sclera non-icteric, no xanthomas, nares are without discharge.  Neck: No carotid bruits. JVD not elevated.  Lungs: Respirations regular and unlabored, without wheezes or rales.  Heart: Regular rate and rhythm. No S3 or S4.  No murmur, no rubs, or gallops appreciated. Abdomen: Soft, non-tender, non-distended with normoactive bowel sounds. No hepatomegaly. No rebound/guarding. No obvious abdominal masses. Msk:  Strength and tone appear normal for age. No joint deformities or effusions. Extremities: No clubbing or cyanosis. No edema.  Distal pedal pulses are 2+ bilaterally. Neuro: Alert and oriented X 3. Moves all extremities spontaneously. No focal deficits noted. Psych:  Responds to questions appropriately with a normal affect. Skin: No rashes or lesions noted  Wt Readings from Last 3 Encounters:  09/09/16 198 lb 9.6 oz (90.1 kg)  02/01/16 202 lb (91.6 kg)  12/03/15 213 lb (96.6 kg)     Studies/Labs Reviewed:   EKG:  EKG is ordered today.  The ekg ordered today demonstrates NSR, HR 79, with PAC's. No acute ST or T-wave changes.   Recent Labs: 12/03/2015: BUN 20; Creatinine, Ser 0.91; Hemoglobin 11.7; Platelets 258; Potassium 4.6; Sodium 141   Lipid Panel    Component Value Date/Time   CHOL  02/25/2010 0407    149        ATP III  CLASSIFICATION:  <200     mg/dL   Desirable  200-239  mg/dL   Borderline High  >=240    mg/dL   High          TRIG 103 02/25/2010 0407   HDL 47 02/25/2010 0407   CHOLHDL 3.2 02/25/2010 0407   VLDL 21 02/25/2010 0407   LDLCALC  02/25/2010 0407    81        Total Cholesterol/HDL:CHD Risk Coronary Heart Disease Risk Table                     Men   Women  1/2 Average Risk   3.4   3.3  Average Risk       5.0   4.4  2 X Average Risk   9.6   7.1  3 X Average Risk  23.4   11.0        Use the calculated Patient Ratio above and the CHD Risk Table to determine the patient's CHD Risk.        ATP III CLASSIFICATION (LDL):  <100     mg/dL   Optimal  100-129  mg/dL   Near or Above                    Optimal  130-159  mg/dL   Borderline  160-189  mg/dL   High  >190     mg/dL   Very High    Additional studies/ records that were reviewed today include:   Cardiac Catheterization: 04/2013 Coronary Anatomy:  Left Main: Large-caliber vessel it tapers distally into a 20-30% stenosis.  It bifurcates into the LAD and a small nondominant Circumflex LAD: This is a moderate to large caliber vessel with a complex focal 70-80% lesion just after the takeoff with ectatic segments on either side.  There is a proximal diagonal that comes off just after this.  The LAD then has essentially subtotal occlusion with competitive flow from the LIMA graft noted.  D1: Moderate caliber vessel with some ostial involvement of the LAD lesion.  The TIMI-3 flow down  this vessel it covers a large portion of the anterolateral wall.  There is no sign of the distal stump from a graft.  Left Circumflex: Small to moderate caliber vessel that essentially courses as an obtuse marginal branch.  There is mild ostial 20% stenosis but otherwise vessels are relatively free of disease.  There is again no evidence of the graft to this vessel.   RCA: Social large-caliber vessel with a long segment of 60-70% stenosis that tapers down  to about a 90% stenosis right before the vessel normalizes distally.  Downstream vessel is relatively normal with a small focal 50% stenosis the vessel bifurcates distally into the posterior descending artery where there is to and fro flow from the graft.  It then continues on is the posterior AV groove vessel that gives off one and and 3 major albeit small caliber posterolateral branches.    RPDA: This vessel as it fills via the vein graft.  There is competitive flow from the graft and the native vessel. ? Post PCI there actually is starting to be brisker native flow down the PDA with some filling of the distal vein graft   RPL Sysytem:The RPAV moderate large-caliber vessel, 3 RPL branches, no significant disease.  Graft Anatomy: ? LIMA-LAD: Widely patent graft with brisk downstream flow.  There is minimal upstream flow, and it does not reach the diagonal branch. ? SVG-OM and SVG-Diag - 100% flush occlusion at the aorto-ostium ? SVG-RPDA: Large-caliber graft with a focal 99%, subtotal occlusion followed by a short area of irregular, hazy appearing vessel with another 99% stenosis after that.  There is TIMI 1 flow the.  It reaches the grafted vessel.  There is roughly 20 mm of extensively thrombosed/grummous filled graft.  This does not appear to be a very favorable target for PCI.  After reviewing the diagnostic images, the clear-cut culprit for this sudden onset of symptoms is the extent of the disease SVG-RCA.  I discussed the findings with Dr. Burt Knack who was here review the films with me.  Both agreed that the best option would be to proceed with PCI of the native RCA as the vein graft was so extensively disease that there is a high likelihood of distal embolization leading to no reflow from the graft into the native vessel.  Plan to then made to proceed with PCI on the native RCA.  Angiomax bolus was administered and drip was started.  He was given 180 mg Brilinta.  Percutaneous Coronary  Intervention:   Guide: 6 Fr   JR 4       Guidewire: BMW, with buddy wire it Pro-Water exchanged for a Therapist, art for post dilation Predilation Balloon: [place] Monorail 2.5 mm x 15 mm;  ? 6 Atm x 30 Sec -- 2 inflations distal and proximal Stent: Promus Premier DES 3.0 mm x 18 mm;  ? Deployed at 12 Atm x 30 Sec,  ? Postdilated to 16 Atm x 45 Sec; 3.2 mm Post-dilation Balloon: [place] 3.5 mm x 12 mm; after several unsuccessful attempts to pass [place] Euphora balloons (15 and 20 mm) were unsuccessful, a wiggle wire was advanced and a shorter balloon was chosen for post dilation. ? 16 Atm x 45 Sec -- 4 inflations at the same level covering the distal edge to the proximal edge of the stent ? Final Diameter: 3.6 mm  Post deployment angiography in multiple views, with and without guidewire in place revealed excellent stent deployment and lesion coverage.  There was no  evidence of dissection or perforation.  There remains the small focal 50% lesion downstream from the stent, that is not changed from previous PCI.  PATIENT DISPOSITION:    The patient was transferred to the PACU holding area in a hemodynamicaly stable, chest pain free condition.  The patient tolerated the procedure well, and there were no complications.  EBL:   < 10 ml  The patient was stable before, during, and after the procedure.  POST-OPERATIVE DIAGNOSIS:    Severe ostial LAD disease as previously described, with progression of disease in the native RCA with extensive long segment of 60 up to 90% stenosis in the mid vessel.  2 of 3 Vein Grafts 100% occluded with subtotal occlusion of the Vein Graft to the RCA that is not amenable to PCI.   Widely patent LIMA-LAD, with minimal restriction of flow in the native circumflex and first diagonal branch.  Successful long segment difficult PCI and proximal-mid RCA lesion with a single Promus Premier DES 3.0 mm x 18 mm postdilated to 3.6 mm    Assessment:    1. Coronary artery  disease involving coronary bypass graft of native heart without angina pectoris; CAD (coronary artery disease) of artery bypass graft:  loss of VG to RCA and VG-Diag   2. Sinus arrhythmia   3. Dyslipidemia, goal LDL below 70   4. Orthostatic hypotension      Plan:   In order of problems listed above:  1. Coronary Artery Disease without Angina - s/p CABG in 2007 with LIMA-LAD, SVG-D1, and SVG-RCA, flush occlusion of SVG-OM and SVG-D1 by cath in 2014 with 99% occlusion of SVG-RCA. DES placed to native RCA at that time. - Denies any recent chest discomfort, palpitations, or dyspnea with exertion. - Continue Plavix, Imdur, and statin therapy. Was previously instructed to discontinue Lopressor but has continued this. With his frequent PACs and stable blood pressure, would continue Lopressor 12.63m BID at this time.  2. Sinus Arrhythmia - noted to have irregular heart rate on examination. Initial electronic pulse reading was 45, at 56 when checked manually. HR irregular on examination.  - EKG showed NSR, HR 79, with PAC's.  - he denies any recent chest discomfort or palpitations. - Continue Lopressor 12.5 mg BID.  3. HLD - Lipid Panel in 2015 showed LDL of 95. No recent Lipid Panel on Epic. LDL goal < 70. - followed by PCP.  - continue Lipitor 464mdaily.   4. Orthostatic Hypotension - BP at 131/86 while sitting today. Dizziness improved as compared to prior visits.  - continue Midodrine 1061mID.    Medication Adjustments/Labs and Tests Ordered: Current medicines are reviewed at length with the patient today.  Concerns regarding medicines are outlined above.  Medication changes, Labs and Tests ordered today are listed in the Patient Instructions below. Patient Instructions  Medication Instructions:  Continue current medications  Labwork: None Ordered  Testing/Procedures: None Ordered  Follow-Up: Your physician wants you to follow-up in: 6 Months with Dr HarEllyn Hackou will  receive a reminder letter in the mail two months in advance. If you don't receive a letter, please call our office to schedule the follow-up appointment.  Any Other Special Instructions Will Be Listed Below (If Applicable).  If you need a refill on your cardiac medications before your next appointment, please call your pharmacy.   SigArna MediciA  09/09/2016 4:43 PM    [facility] [address]  [id number] Phone: [phone]; Fax: [phone]  [address] [place], [place] [address] Phone: [phone]

## 2016-10-02 ENCOUNTER — Other Ambulatory Visit: Payer: Self-pay | Admitting: Cardiology

## 2016-10-11 ENCOUNTER — Telehealth: Payer: Self-pay | Admitting: Cardiology

## 2016-10-11 MED ORDER — NITROGLYCERIN 0.4 MG SL SUBL: 0.4000 mg | SUBLINGUAL_TABLET | SUBLINGUAL | 3 refills | Status: DC | PRN

## 2016-10-11 NOTE — Telephone Encounter (Signed)
Pt would like a call back about his Nitro. He thinks it has expired  2014.   Please give him a call.

## 2016-10-11 NOTE — Telephone Encounter (Signed)
Called pt and verified pharmacy for new nitro rx.  New rx sent as requested

## 2016-10-30 DIAGNOSIS — I6789 Other cerebrovascular disease: Secondary | ICD-10-CM | POA: Diagnosis not present

## 2016-10-30 DIAGNOSIS — G459 Transient cerebral ischemic attack, unspecified: Secondary | ICD-10-CM | POA: Diagnosis not present

## 2016-11-23 ENCOUNTER — Other Ambulatory Visit: Payer: Self-pay | Admitting: Cardiology

## 2016-11-23 NOTE — Telephone Encounter (Signed)
REFILL 

## 2017-01-01 ENCOUNTER — Other Ambulatory Visit: Payer: Self-pay | Admitting: Cardiology

## 2017-02-12 ENCOUNTER — Other Ambulatory Visit: Payer: Self-pay | Admitting: Cardiology

## 2017-02-15 DIAGNOSIS — H26493 Other secondary cataract, bilateral: Secondary | ICD-10-CM | POA: Diagnosis not present

## 2017-02-15 DIAGNOSIS — H524 Presbyopia: Secondary | ICD-10-CM | POA: Diagnosis not present

## 2017-02-15 DIAGNOSIS — H353132 Nonexudative age-related macular degeneration, bilateral, intermediate dry stage: Secondary | ICD-10-CM | POA: Diagnosis not present

## 2017-02-15 DIAGNOSIS — H1859 Other hereditary corneal dystrophies: Secondary | ICD-10-CM | POA: Diagnosis not present

## 2017-02-15 DIAGNOSIS — H04123 Dry eye syndrome of bilateral lacrimal glands: Secondary | ICD-10-CM | POA: Diagnosis not present

## 2017-03-02 ENCOUNTER — Other Ambulatory Visit: Payer: Self-pay | Admitting: Cardiology

## 2017-03-15 ENCOUNTER — Ambulatory Visit (INDEPENDENT_AMBULATORY_CARE_PROVIDER_SITE_OTHER): Payer: Medicare Other | Admitting: Cardiology

## 2017-03-15 ENCOUNTER — Encounter: Payer: Self-pay | Admitting: Cardiology

## 2017-03-15 VITALS — BP 106/78 | HR 67 | Ht 74.0 in | Wt 199.0 lb

## 2017-03-15 DIAGNOSIS — I25810 Atherosclerosis of coronary artery bypass graft(s) without angina pectoris: Secondary | ICD-10-CM

## 2017-03-15 DIAGNOSIS — E785 Hyperlipidemia, unspecified: Secondary | ICD-10-CM

## 2017-03-15 DIAGNOSIS — I10 Essential (primary) hypertension: Secondary | ICD-10-CM

## 2017-03-15 DIAGNOSIS — Z9861 Coronary angioplasty status: Secondary | ICD-10-CM

## 2017-03-15 DIAGNOSIS — I951 Orthostatic hypotension: Secondary | ICD-10-CM | POA: Diagnosis not present

## 2017-03-15 DIAGNOSIS — R5382 Chronic fatigue, unspecified: Secondary | ICD-10-CM

## 2017-03-15 DIAGNOSIS — I2510 Atherosclerotic heart disease of native coronary artery without angina pectoris: Secondary | ICD-10-CM | POA: Diagnosis not present

## 2017-03-15 DIAGNOSIS — I491 Atrial premature depolarization: Secondary | ICD-10-CM

## 2017-03-15 NOTE — Patient Instructions (Signed)
MEDICATION INSTRUCTIONS   WEAN METOPROLOL TAKE ONCE A DAY  UNTIL THE BOTTLE IS COMPLETED. THEN STOP.   REASON TO RESTART  TAKING EITHER ONCE OR TWICE A DAY IS IF YOU HAVE CHEST DISCOMFORT OR RAPID HEART RATE    Your physician wants you to follow-up in 6 MONTHS WITH DR HARDING. You will receive a reminder letter in the mail two months in advance. If you don't receive a letter, please call our office to schedule the follow-up appointment.   If you need a refill on your cardiac medications before your next appointment, please call your pharmacy.

## 2017-03-15 NOTE — Progress Notes (Signed)
PCP: Marden Noble, MD  Clinic Note: Chief Complaint  Patient presents with  . Follow-up  . Coronary Artery Disease    PCI & CABG    HPI: Joshua Chambers is a 83 y.o. male with a PMH below who presents today for Six-month follow-up for CAD. CAD history: s/p CABG in 2007 with LIMA-LAD, SVG-D1, and SVG-RCA, flush occlusion of SVG-OM and SVG-D1 by cath in 2014, 99% occlusion of SVG-RCA with DES placed to native RCA at that time). He also has hypertension, with orthostatic hypotension. He also has hyperlipidemia and history of DVT.  Joshua Chambers was last seen on the 23rd by Randall An, [name]. He was doing well. Denied any chest discomfort. I had backed down his beta blocker back in 2017 because of dizziness and fatigue.  Recent Hospitalizations: none  Studies Personally Reviewed - (if available, images/films reviewed: From Epic Chart or Care Everywhere)  none  Interval History: Joshua Chambers presents today doing well.  He has new hearing aids, so he can hear well.  His only complaint is that he still has balance issues & has to use a cane.  This is also associated with bilateral hand & feet numbness.  He still notes exertional dyspnea & has to "slow down" at times.  He and his wife have been packing up to move to  (to be near her family).  With all the packing & moving boxes, he is noting that he will have to sit down between moving each box.  With routine walking around, he does OK - just walks slow.  He still does ~2 mile on his stationary bike every day. Still notes just feeling tired / fatigued.  He has not had any anginal CP symptoms with rest or exertion and denies CHF symptoms of PND, orthopnea or edema.   No palpitations, lightheadedness, dizziness, weakness or syncope/near syncope. No TIA/amaurosis fugax symptoms. No melena, hematochezia, hematuria, or epstaxis. No claudication.  ROS: A comprehensive was performed. Pertinent symptoms noted in HPI. Review of Systems    Respiratory: Negative for cough and wheezing.   Gastrointestinal: Negative for abdominal pain, blood in stool, constipation, heartburn and melena.  Genitourinary: Positive for frequency (nocturia).  Musculoskeletal: Positive for back pain and joint pain.  Neurological: Positive for tingling (numbness in both hands (L > R), also in feet).       Poor balance - unsteady gait  Psychiatric/Behavioral: Negative for memory loss.  All other systems reviewed and are negative.  I have reviewed and (if needed) personally updated the patient's problem list, medications, allergies, past medical and surgical history, social and family history.   Past Medical History:  Diagnosis Date  . Arthritis   . CAD (coronary artery disease), autologous vein bypass graft 04/19/2013   100% flush occlusion of SVG-OM & SVG-Diag; near Subtotal Occlusion of SVG-RCA (PCI of RCA done to avoid distal embolization with PCI to SVG)  . CAD (coronary artery disease), native coronary artery 2007   Multivessel CAD referred for CABG  . CAD S/P percutaneous coronary angioplasty  04/19/2013   PCI of Native RCA - Promus Premier DES 3.0 mm x 38 mm (3.6 mm);   [name] Dyslipidemia, goal LDL below 70   . History of DVT of lower extremity    Right popliteal vein  . History of stroke August 2011   Possible TIA in 2001;  Echo: EF 45-50%, mild HK of anterior septum.  [name] Hx of orthostatic hypotension 2009   Positive tilt table:  dizziness/vasopressor type; previously on the decision   . Hx SBO   . Nephrolithiasis   . S/P CABG x 3 2007   LIMA-LAD, SVG-DIAG, SVG-RCA  . Unsteady gait     Past Surgical History:  Procedure Laterality Date  . APPENDECTOMY    . COLON SURGERY    . CORONARY ANGIOPLASTY WITH STENT PLACEMENT  04/19/13   Stent-Promus DES (3.0 mm x 38 mm - 3.6 mm) to native RCA prox-mid,  . CORONARY ARTERY BYPASS GRAFT  2007   LIMA-LAD, SVG-D1, SVG-RCA  . LEFT HEART CATHETERIZATION WITH CORONARY/GRAFT ANGIOGRAM Left 04/19/2013    LEFT HEART CATH W/ CORONARY/GRAFT ANGIOGRAM;  [name], MD;  Location: [facility] CATH LAB;  LM 20%-->LAD 70-80% then subTO after D1 (Patent LIMA-LAD); Small non-dom Cx ~20% ostial.  RCA long 60-70% prox --> 90% mid.  SVG-OM & SVG-Diag 100%, diffuse Subtotal occlusion of SVG-rPD  . TONSILLECTOMY      Current Meds  Medication Sig  . atorvastatin (LIPITOR) 40 MG tablet TAKE 1 TABLET BY MOUTH DAILY AT 6:00 PM  . clopidogrel (PLAVIX) 75 MG tablet TAKE 1 TABLET BY MOUTH EVERY DAY  . dutasteride (AVODART) 0.5 MG capsule Take 0.5 mg by mouth daily.   . isosorbide mononitrate (IMDUR) 30 MG 24 hr tablet TAKE 1/2 TABLET BY MOUTH EVERY DAY  . midodrine (PROAMATINE) 10 MG tablet TAKE 1 TABLET(10 MG) BY MOUTH THREE TIMES DAILY WITH MEALS  . nitroGLYCERIN (NITROSTAT) 0.4 MG SL tablet Place 1 tablet (0.4 mg total) under the tongue every 5 (five) minutes as needed for chest pain.  . Polyvinyl Alcohol-Povidone (REFRESH OP) Place 1 drop into both eyes daily as needed.  [name] PULMICORT FLEXHALER 180 MCG/ACT inhaler Inhale 2 puffs into the lungs daily.   . ranitidine (ZANTAC) 150 MG tablet Take 150 mg by mouth 2 (two) times daily as needed for heartburn.   . [DISCONTINUED] metoprolol tartrate (LOPRESSOR) 25 MG tablet TAKE 1/2 TABLET(12.5 MG) BY MOUTH TWICE DAILY    Allergies  Allergen Reactions  . Amiodarone Other (See Comments)    Extreme dehydration  . Diltiazem Other (See Comments)    Pt unsure of reaction  . Sulfa Antibiotics Other (See Comments)    Pt unsure of reaction    Social History   Social History  . Marital status: Married    Spouse name: N/A  . Number of children: N/A  . Years of education: N/A   Social History Main Topics  . Smoking status: Never Smoker  . Smokeless tobacco: Never Used  . Alcohol use No  . Drug use: No  . Sexual activity: Not Asked   Other Topics Concern  . None   Social History Narrative    He is now remarried for the last 2 years, his first wife of 60 years is  deceased. He has 1   child from that first marriage and 2 grandchildren.    He does not smoke, does not drink.    He is not very active but he does kind of meander around with his new wife who is permanently disabled.     family history includes Hypertension in his son.  Wt Readings from Last 3 Encounters:  03/16/17 196 lb 3.2 oz (89 kg)  03/15/17 199 lb (90.3 kg)  09/09/16 198 lb 9.6 oz (90.1 kg)    PHYSICAL EXAM BP 106/78   Pulse 67   Ht 6\' 2"  (1.88 m)   Wt 199 lb (90.3 kg)   BMI 25.55  kg/m  Physical Exam  Constitutional: He is oriented to person, place, and time. He appears well-developed and well-nourished.  Appears younger than stated age  HENT:  Head: Normocephalic and atraumatic.  Mouth/Throat: Oropharynx is clear and moist.  Neck: No hepatojugular reflux and no JVD present. Carotid bruit is not present.  Cardiovascular: Normal rate, regular rhythm, normal heart sounds and intact distal pulses.  Exam reveals no gallop and no friction rub.   No murmur heard. Pulmonary/Chest: Effort normal and breath sounds normal. No respiratory distress. He has no wheezes.  Abdominal: Soft. Bowel sounds are normal. He exhibits no distension. There is no tenderness. There is no rebound.  Musculoskeletal: Normal range of motion. He exhibits no edema.  Neurological: He is alert and oriented to person, place, and time.  Skin: Skin is warm and dry. No rash noted. No erythema.  Psychiatric: He has a normal mood and affect. His behavior is normal. Judgment and thought content normal.  Nursing note and vitals reviewed.    Adult ECG Report NSR with PVCs vs. PAC - rate 67   Other studies Reviewed: Additional studies/ records that were reviewed today include:  Recent Labs:  Not available   ASSESSMENT / PLAN: Problem List Items Addressed This Visit    CAD S/P percutaneous coronary angioplasty - PCI of native RCA; Promus DES 3.0 mm x38 mm (3.6 mm) - Primary (Chronic)    No active angina  since last PCI. Still quite active for his age. Remains on Plavix, Imdur & statin --> will wean off of low dose BB b/c fatigue concern & baseline low BP.       Coronary artery disease involving coronary bypass graft of native heart without angina pectoris; CAD (coronary artery disease) of artery bypass graft:  loss of VG to RCA and VG-Diag (Chronic)    Last ischemic evaluation was with PCI in 2014 -- based upon age & lack of recurrent Sx, we have decided to forgo follow-up screening Myoview ST.      Dyslipidemia, goal LDL below 70 (Chronic)    On statin - monitored by PCP. No myalgias or memory issues.      Essential hypertension (Chronic)    No longer an issue - actually weaning off of BB      Fatigue (Chronic)    Doing better - will fully wean off of Beta Blocker - cut to once daily until bottle complete & then stop.      Orthostatic hypotension (Chronic)    BP better with Midodrine - stopping Beta Blocker.  Hydrate.      Premature atrial contractions (Chronic)    Asymptomatic - follow along with weaning off Beta Blocker. May need PRN option.         Current medicines are reviewed at length with the patient today. (+/- concerns) n/a The following changes have been made: n/a  Patient Instructions  MEDICATION INSTRUCTIONS   WEAN METOPROLOL TAKE ONCE A DAY  UNTIL THE BOTTLE IS COMPLETED. THEN STOP.   REASON TO RESTART  TAKING EITHER ONCE OR TWICE A DAY IS IF YOU HAVE CHEST DISCOMFORT OR RAPID HEART RATE    Your physician wants you to follow-up in 6 MONTHS WITH DR Emmarose Klinke. You will receive a reminder letter in the mail two months in advance. If you don't receive a letter, please call our office to schedule the follow-up appointment.   If you need a refill on your cardiac medications before your next appointment, please call your pharmacy.  Studies Ordered:   No orders of the defined types were placed in this encounter.     Bryan Lemma, M.D.,  M.S. Interventional Cardiologist   Pager # [phone] Phone # [phone] [address] [place], [place] [address]

## 2017-03-16 ENCOUNTER — Emergency Department (HOSPITAL_COMMUNITY): Payer: Medicare Other

## 2017-03-16 ENCOUNTER — Observation Stay (HOSPITAL_COMMUNITY)
Admission: EM | Admit: 2017-03-16 | Discharge: 2017-03-17 | Disposition: A | Discharge status: Home / Self Care | Payer: Medicare Other | Attending: Internal Medicine | Admitting: Internal Medicine

## 2017-03-16 ENCOUNTER — Observation Stay (HOSPITAL_COMMUNITY): Payer: Medicare Other

## 2017-03-16 DIAGNOSIS — Z7902 Long term (current) use of antithrombotics/antiplatelets: Secondary | ICD-10-CM | POA: Diagnosis not present

## 2017-03-16 DIAGNOSIS — Z79899 Other long term (current) drug therapy: Secondary | ICD-10-CM | POA: Diagnosis not present

## 2017-03-16 DIAGNOSIS — G459 Transient cerebral ischemic attack, unspecified: Secondary | ICD-10-CM | POA: Diagnosis not present

## 2017-03-16 DIAGNOSIS — Z7982 Long term (current) use of aspirin: Secondary | ICD-10-CM | POA: Insufficient documentation

## 2017-03-16 DIAGNOSIS — Z86718 Personal history of other venous thrombosis and embolism: Secondary | ICD-10-CM | POA: Diagnosis not present

## 2017-03-16 DIAGNOSIS — I10 Essential (primary) hypertension: Secondary | ICD-10-CM | POA: Diagnosis not present

## 2017-03-16 DIAGNOSIS — I491 Atrial premature depolarization: Secondary | ICD-10-CM | POA: Insufficient documentation

## 2017-03-16 DIAGNOSIS — Z8673 Personal history of transient ischemic attack (TIA), and cerebral infarction without residual deficits: Secondary | ICD-10-CM | POA: Diagnosis not present

## 2017-03-16 DIAGNOSIS — I951 Orthostatic hypotension: Secondary | ICD-10-CM | POA: Diagnosis not present

## 2017-03-16 DIAGNOSIS — Z955 Presence of coronary angioplasty implant and graft: Secondary | ICD-10-CM | POA: Diagnosis not present

## 2017-03-16 DIAGNOSIS — R4781 Slurred speech: Secondary | ICD-10-CM | POA: Diagnosis not present

## 2017-03-16 DIAGNOSIS — R031 Nonspecific low blood-pressure reading: Secondary | ICD-10-CM | POA: Diagnosis not present

## 2017-03-16 DIAGNOSIS — I2510 Atherosclerotic heart disease of native coronary artery without angina pectoris: Secondary | ICD-10-CM | POA: Insufficient documentation

## 2017-03-16 DIAGNOSIS — I25810 Atherosclerosis of coronary artery bypass graft(s) without angina pectoris: Secondary | ICD-10-CM

## 2017-03-16 DIAGNOSIS — E785 Hyperlipidemia, unspecified: Secondary | ICD-10-CM | POA: Diagnosis not present

## 2017-03-16 DIAGNOSIS — R29810 Facial weakness: Secondary | ICD-10-CM | POA: Insufficient documentation

## 2017-03-16 DIAGNOSIS — R200 Anesthesia of skin: Secondary | ICD-10-CM | POA: Diagnosis not present

## 2017-03-16 HISTORY — DX: Transient cerebral ischemic attack, unspecified: G45.9

## 2017-03-16 LAB — COMPREHENSIVE METABOLIC PANEL
ALBUMIN: 3.9 g/dL (ref 3.5–5.0)
ALT: 13 U/L — ABNORMAL LOW (ref 17–63)
ANION GAP: 9 (ref 5–15)
AST: 18 U/L (ref 15–41)
Alkaline Phosphatase: 56 U/L (ref 38–126)
BILIRUBIN TOTAL: 0.9 mg/dL (ref 0.3–1.2)
BUN: 23 mg/dL — AB (ref 6–20)
CHLORIDE: 107 mmol/L (ref 101–111)
CO2: 24 mmol/L (ref 22–32)
Calcium: 9.6 mg/dL (ref 8.9–10.3)
Creatinine, Ser: 1.17 mg/dL (ref 0.61–1.24)
GFR calc Af Amer: >60 mL/min (ref >60)
GFR calc non Af Amer: 53 mL/min — ABNORMAL LOW (ref >60)
GLUCOSE: 102 mg/dL — AB (ref 65–99)
POTASSIUM: 4.7 mmol/L (ref 3.5–5.1)
SODIUM: 140 mmol/L (ref 135–145)
TOTAL PROTEIN: 6.3 g/dL — AB (ref 6.5–8.1)

## 2017-03-16 LAB — URINALYSIS, ROUTINE W REFLEX MICROSCOPIC
BILIRUBIN URINE: NEGATIVE
Glucose, UA: NEGATIVE mg/dL
Hgb urine dipstick: NEGATIVE
Ketones, ur: NEGATIVE mg/dL
LEUKOCYTES UA: NEGATIVE
NITRITE: NEGATIVE
PH: 7 (ref 5.0–8.0)
Protein, ur: NEGATIVE mg/dL
SPECIFIC GRAVITY, URINE: 1.012 (ref 1.005–1.030)

## 2017-03-16 LAB — CBC
HCT: 34.5 % — ABNORMAL LOW (ref 39.0–52.0)
HEMOGLOBIN: 11.4 g/dL — AB (ref 13.0–17.0)
MCH: 32 pg (ref 26.0–34.0)
MCHC: 33 g/dL (ref 30.0–36.0)
MCV: 96.9 fL (ref 78.0–100.0)
PLATELETS: 266 10*3/uL (ref 150–400)
RBC: 3.56 MIL/uL — AB (ref 4.22–5.81)
RDW: 13 % (ref 11.5–15.5)
WBC: 6.2 10*3/uL (ref 4.0–10.5)

## 2017-03-16 LAB — DIFFERENTIAL
BASOS ABS: 0 10*3/uL (ref 0.0–0.1)
Basophils Relative: 0 %
EOS ABS: 0 10*3/uL (ref 0.0–0.7)
EOS PCT: 1 %
LYMPHS ABS: 0.8 10*3/uL (ref 0.7–4.0)
Lymphocytes Relative: 13 %
Monocytes Absolute: 0.6 10*3/uL (ref 0.1–1.0)
Monocytes Relative: 9 %
NEUTROS PCT: 77 %
Neutro Abs: 4.7 10*3/uL (ref 1.7–7.7)

## 2017-03-16 LAB — PROTIME-INR
INR: 0.98
PROTHROMBIN TIME: 12.9 s (ref 11.4–15.2)

## 2017-03-16 LAB — I-STAT TROPONIN, ED: Troponin i, poc: 0 ng/mL (ref 0.00–0.08)

## 2017-03-16 LAB — APTT: APTT: 30 s (ref 24–36)

## 2017-03-16 MED ORDER — ASPIRIN 325 MG PO TABS
325.0000 mg | ORAL_TABLET | Freq: Every day | ORAL | Status: DC
  Administered 2017-03-17: 325 mg via ORAL
  Filled 2017-03-16: qty 1

## 2017-03-16 MED ORDER — POLYVINYL ALCOHOL 1.4 % OP SOLN
1.0000 [drp] | Freq: Every day | OPHTHALMIC | Status: DC | PRN
  Filled 2017-03-16: qty 15

## 2017-03-16 MED ORDER — ACETAMINOPHEN 325 MG PO TABS
650.0000 mg | ORAL_TABLET | ORAL | Status: DC | PRN
  Administered 2017-03-17: 650 mg via ORAL
  Filled 2017-03-16: qty 2

## 2017-03-16 MED ORDER — ACETAMINOPHEN 160 MG/5ML PO SOLN: 650.0000 mg | ORAL | Status: DC | PRN

## 2017-03-16 MED ORDER — SODIUM CHLORIDE 0.9 % IV SOLN
INTRAVENOUS | Status: DC
  Administered 2017-03-16: 1000 mL via INTRAVENOUS

## 2017-03-16 MED ORDER — ASPIRIN 300 MG RE SUPP: 300.0000 mg | Freq: Every day | RECTAL | Status: DC

## 2017-03-16 MED ORDER — STROKE: EARLY STAGES OF RECOVERY BOOK
Freq: Once | Status: AC
  Administered 2017-03-16

## 2017-03-16 MED ORDER — FAMOTIDINE 20 MG PO TABS
20.0000 mg | ORAL_TABLET | Freq: Two times a day (BID) | ORAL | Status: DC
  Administered 2017-03-16 – 2017-03-17 (×2): 20 mg via ORAL
  Filled 2017-03-16 (×2): qty 1

## 2017-03-16 MED ORDER — DUTASTERIDE 0.5 MG PO CAPS
0.5000 mg | ORAL_CAPSULE | Freq: Every day | ORAL | Status: DC
  Administered 2017-03-17: 0.5 mg via ORAL
  Filled 2017-03-16: qty 1

## 2017-03-16 MED ORDER — ENOXAPARIN SODIUM 40 MG/0.4ML ~~LOC~~ SOLN
40.0000 mg | SUBCUTANEOUS | Status: DC
  Administered 2017-03-17: 40 mg via SUBCUTANEOUS
  Filled 2017-03-16 (×2): qty 0.4

## 2017-03-16 MED ORDER — ATORVASTATIN CALCIUM 40 MG PO TABS: 40.0000 mg | ORAL_TABLET | Freq: Every day | ORAL | Status: DC

## 2017-03-16 MED ORDER — BUDESONIDE 0.25 MG/2ML IN SUSP
0.2500 mg | Freq: Two times a day (BID) | RESPIRATORY_TRACT | Status: DC
  Administered 2017-03-17: 0.25 mg via RESPIRATORY_TRACT
  Filled 2017-03-16: qty 2

## 2017-03-16 MED ORDER — CLOPIDOGREL BISULFATE 75 MG PO TABS
75.0000 mg | ORAL_TABLET | Freq: Every day | ORAL | Status: DC
  Administered 2017-03-17: 75 mg via ORAL
  Filled 2017-03-16: qty 1

## 2017-03-16 MED ORDER — ACETAMINOPHEN 650 MG RE SUPP: 650.0000 mg | RECTAL | Status: DC | PRN

## 2017-03-16 MED ORDER — NITROGLYCERIN 0.4 MG SL SUBL: 0.4000 mg | SUBLINGUAL_TABLET | SUBLINGUAL | Status: DC | PRN

## 2017-03-16 NOTE — ED Notes (Signed)
Attempted to call report

## 2017-03-16 NOTE — ED Notes (Signed)
Patient transported to MRI 

## 2017-03-16 NOTE — ED Provider Notes (Signed)
[facility]-EMERGENCY DEPT Provider Note   CSN: [id number] Arrival date & time: 03/16/17  1336     History   Chief Complaint Chief Complaint  Patient presents with  . Stroke Symptoms    HPI Joshua Chambers is a 83 y.o. male.  83 year old male with past medical history including CAD, DVT, TIA who p/w left-sided numbness. At 12:00 today, the patient was riding with his wife who was driving when he had a sudden onset of left-sided mouth and lip numbness associated with left arm numbness and slurred speech according to wife. They called EMS and by the time EMS arrived at 12:30, symptoms had resolved. The patient currently denies any symptoms. He denies any headache, visual changes, neck pain, vomiting fevers, recent illness, or unilateral weakness. He states that left arm numbness has occurred before, he was evaluated earlier this urine the ED for the left arm numbness but did not have the other symptoms at that time.   The history is provided by the patient.    Past Medical History:  Diagnosis Date  . Arthritis   . CAD (coronary artery disease), autologous vein bypass graft 04/19/2013   100% flush occlusion of SVG-OM & SVG-Diag; near Subtotal Occlusion of SVG-RCA (PCI of RCA done to avoid distal embolization with PCI to SVG)  . CAD (coronary artery disease), native coronary artery 2007   Multivessel CAD referred for CABG  . CAD S/P percutaneous coronary angioplasty  04/19/2013   PCI of Native RCA - Promus Premier DES 3.0 mm x 38 mm (3.6 mm);   [name] Dyslipidemia, goal LDL below 70   . History of DVT of lower extremity    Right popliteal vein  . History of stroke August 2011   Possible TIA in 2001;  Echo: EF 45-50%, mild HK of anterior septum.  [name] Hx of orthostatic hypotension 2009   Positive tilt table:  dizziness/vasopressor type; previously on the decision   . Hx SBO   . Nephrolithiasis   . S/P CABG x 3 2007   LIMA-LAD, SVG-DIAG, SVG-RCA  . Unsteady gait     Patient Active Problem  List   Diagnosis Date Noted  . Sinus arrhythmia 09/09/2016  . Premature atrial contractions 07/28/2015  . Hematuria 05/02/2013  . Essential hypertension 05/02/2013  . Dyspnea 04/30/2013  . Orthostatic hypotension 04/20/2013  . Coronary artery disease involving coronary bypass graft of native heart without angina pectoris; CAD (coronary artery disease) of artery bypass graft:  loss of VG to RCA and VG-Diag 04/17/2013  . CAD S/P percutaneous coronary angioplasty - PCI of native RCA; Promus DES 3.0 mm x38 mm (3.6 mm) 04/17/2013  . Other and unspecified coagulation defects 04/17/2013  . Preoperative examination, unspecified 04/17/2013  . Fatigue 04/17/2013  . Dyslipidemia, goal LDL below 70   . S/P CABG x 4; LIMA-LAD, SVG-RPDA, SVG-OM, SVG-D1 -- all but LIMA-LAD occluded 03/18/2006    Class: Chronic    Past Surgical History:  Procedure Laterality Date  . APPENDECTOMY    . COLON SURGERY    . CORONARY ANGIOPLASTY WITH STENT PLACEMENT  04/19/13   Stent-Promus DES (3.0 mm x 38 mm - 3.6 mm) to native RCA prox-mid,  . CORONARY ARTERY BYPASS GRAFT  2007   LIMA-LAD, SVG-D1, SVG-RCA  . LEFT HEART CATHETERIZATION WITH CORONARY/GRAFT ANGIOGRAM Left 04/19/2013   LEFT HEART CATH W/ CORONARY/GRAFT ANGIOGRAM;  [name], MD;  Location: [facility] CATH LAB;  LM 20%-->LAD 70-80% then subTO after D1 (Patent LIMA-LAD); Small non-dom Cx ~20%  ostial.  RCA long 60-70% prox --> 90% mid.  SVG-OM & SVG-Diag 100%, diffuse Subtotal occlusion of SVG-rPD  . TONSILLECTOMY         Home Medications    Prior to Admission medications   Medication Sig Start Date End Date Taking? Authorizing Provider  atorvastatin (LIPITOR) 40 MG tablet TAKE 1 TABLET BY MOUTH DAILY AT 6:00 PM 08/22/16  Yes [name], MD  clopidogrel (PLAVIX) 75 MG tablet TAKE 1 TABLET BY MOUTH EVERY DAY 01/02/17  Yes [name], MD  dutasteride (AVODART) 0.5 MG capsule Take 0.5 mg by mouth daily.    Yes [provider]  isosorbide  mononitrate (IMDUR) 30 MG 24 hr tablet TAKE 1/2 TABLET BY MOUTH EVERY DAY 11/23/16  Yes [name], MD  metoprolol tartrate (LOPRESSOR) 25 MG tablet Take 12.5 mg by mouth daily. 03/02/17  Yes [provider]  midodrine (PROAMATINE) 10 MG tablet TAKE 1 TABLET(10 MG) BY MOUTH THREE TIMES DAILY WITH MEALS 02/13/17  Yes [name], MD  nitroGLYCERIN (NITROSTAT) 0.4 MG SL tablet Place 1 tablet (0.4 mg total) under the tongue every 5 (five) minutes as needed for chest pain. 10/11/16  Yes [name], MD  Polyvinyl Alcohol-Povidone (REFRESH OP) Place 1 drop into both eyes daily as needed.   Yes [provider]  PULMICORT FLEXHALER 180 MCG/ACT inhaler Inhale 2 puffs into the lungs daily.  07/24/14  Yes [provider]  ranitidine (ZANTAC) 150 MG tablet Take 150 mg by mouth 2 (two) times daily as needed for heartburn.    Yes [provider]    Family History Family History  Problem Relation Age of Onset  . Hypertension Son     Social History Social History  Substance Use Topics  . Smoking status: Never Smoker  . Smokeless tobacco: Never Used  . Alcohol use No     Allergies   Amiodarone; Diltiazem; and Sulfa antibiotics   Review of Systems Review of Systems All other systems reviewed and are negative except that which was mentioned in HPI   Physical Exam Updated Vital Signs BP (!) 155/91   Pulse 73   Temp 97.8 F (36.6 C) (Oral)   Resp 15   SpO2 100%   Physical Exam  Constitutional: He is oriented to person, place, and time. He appears well-developed and well-nourished. No distress.  Awake, alert  HENT:  Head: Normocephalic and atraumatic.  Eyes: Pupils are equal, round, and reactive to light. Conjunctivae and EOM are normal.  Neck: Neck supple.  Cardiovascular: Normal rate and normal heart sounds.  An irregularly irregular rhythm present.  No murmur heard. Pulmonary/Chest: Effort normal and breath sounds normal. No respiratory  distress.  Abdominal: Soft. Bowel sounds are normal. He exhibits no distension. There is no tenderness.  Musculoskeletal: He exhibits no edema.  Neurological: He is alert and oriented to person, place, and time. He has normal reflexes. No cranial nerve deficit. He exhibits normal muscle tone.  Fluent speech, normal finger-to-nose testing, negative pronator drift, no clonus 5/5 strength and normal sensation x all 4 extremities  Skin: Skin is warm and dry.  Psychiatric: He has a normal mood and affect. Judgment and thought content normal.  Nursing note and vitals reviewed.    ED Treatments / Results  Labs (all labs ordered are listed, but only abnormal results are displayed) Labs Reviewed  CBC - Abnormal; Notable for the following:       Result Value   RBC 3.56 (*)  Hemoglobin 11.4 (*)    HCT 34.5 (*)    All other components within normal limits  COMPREHENSIVE METABOLIC PANEL - Abnormal; Notable for the following:    Glucose, Bld 102 (*)    BUN 23 (*)    Total Protein 6.3 (*)    ALT 13 (*)    GFR calc non Af Amer 53 (*)    All other components within normal limits  PROTIME-INR  APTT  DIFFERENTIAL  ETHANOL  URINALYSIS, ROUTINE W REFLEX MICROSCOPIC  I-STAT TROPONIN, ED    EKG  EKG Interpretation  Date/Time:  Thursday March 16 2017 14:10:47 EDT Ventricular Rate:  57 PR Interval:    QRS Duration: 119 QT Interval:  409 QTC Calculation: 399 R Axis:   13 Text Interpretation:  Sinus or ectopic atrial rhythm Multiple premature complexes, vent & supraven Incomplete left bundle branch block Low voltage, precordial leads Sinus arrhythmia Confirmed by Frederick Peers [id number]) on 03/16/2017 2:47:25 PM       Radiology Ct Head Wo Contrast  Result Date: 03/16/2017 CLINICAL DATA:  Left-sided facial numbness of sudden onset today. EXAM: CT HEAD WITHOUT CONTRAST TECHNIQUE: Contiguous axial images were obtained from the base of the skull through the vertex without intravenous  contrast. COMPARISON:  12/03/2015 FINDINGS: Brain: Mild age related involutional changes of the brain. Chronic appearing bilateral basal ganglial lacune or infarcts. Minimal small vessel ischemic disease of periventricular white matter. No large vascular territory infarct. No intracranial hemorrhage, intra-axial mass nor extra-axial fluid. No hydrocephalus Vascular: Mild atherosclerosis of the carotid siphons. Skull: Negative for fracture or focal lesions. Sinuses/Orbits: Clear bilateral mastoids and paranasal sinuses. Intact orbits and globes with bilateral lens replacements. Other: None IMPRESSION: 1. Minimal chronic small vessel ischemic disease of periventricular white matter. 2. Chronic appearing bilateral small basal ganglial lacunar infarcts. 3. No acute intracranial abnormality. Electronically Signed   By: Tollie Eth M.D.   On: 03/16/2017 16:11    Procedures Procedures (including critical care time)  Medications Ordered in ED Medications - No data to display   Initial Impression / Assessment and Plan / ED Course  I have reviewed the triage vital signs and the nursing notes.  Pertinent labs & imaging results that were available during my care of the patient were reviewed by me and considered in my medical decision making (see chart for details).    Pt w/ sudden L lip, L arm numbness and Slurred speech that resolved prior to arrival. He was neurologically intact on exam with no focal neurologic deficits. Vital signs reassuring. His lab work is reassuring. Head CT shows old lacunar infarcts but negative for acute findings. His symptoms are concerning for TIA. I discussed with neurology, dr. Amada Jupiter, Who will see the patient in consultation. Discussed admission with [facility] hospitalist, Dr. Isidoro Donning, and pt admitted for further work up. Final Clinical Impressions(s) / ED Diagnoses   Final diagnoses:  Transient cerebral ischemia, unspecified type    New Prescriptions New Prescriptions   No  medications on file     Little, Ambrose Finland, MD 03/16/17 1705

## 2017-03-16 NOTE — Consult Note (Addendum)
Requesting Physician: Dr. Clarene Duke    Chief Complaint: Slurred speech and left arm weakness  History obtained from: Patient and Chart   HPI:                                                                                                                                       Joshua Chambers is an 83 y.o. male CAD status post CABG, TIA, hypertension, hyperlipidemia noticed around 11 am his speech was slurred, wife noticed he had  left facial droop and he felt his  left arm went dead, lasted for close to an hour. Symptoms had resolved by time patient arrived to ER. Says in the past he has intermittent numbness of left arm.  Denies visual changes, aphasia, gait imbalance.    Date last known well: 03/16/17 Time last known well: 11 am tPA Given:no, outside window, symptoms resolved  NIHSS; 0        Modified Rankin:0  Past Medical History:  Diagnosis Date  . Arthritis   . CAD (coronary artery disease), autologous vein bypass graft 04/19/2013   100% flush occlusion of SVG-OM & SVG-Diag; near Subtotal Occlusion of SVG-RCA (PCI of RCA done to avoid distal embolization with PCI to SVG)  . CAD (coronary artery disease), native coronary artery 2007   Multivessel CAD referred for CABG  . CAD S/P percutaneous coronary angioplasty  04/19/2013   PCI of Native RCA - Promus Premier DES 3.0 mm x 38 mm (3.6 mm);   [name] Dyslipidemia, goal LDL below 70   . History of DVT of lower extremity    Right popliteal vein  . History of stroke August 2011   Possible TIA in 2001;  Echo: EF 45-50%, mild HK of anterior septum.  [name] Hx of orthostatic hypotension 2009   Positive tilt table:  dizziness/vasopressor type; previously on the decision   . Hx SBO   . Nephrolithiasis   . S/P CABG x 3 2007   LIMA-LAD, SVG-DIAG, SVG-RCA  . Unsteady gait     Past Surgical History:  Procedure Laterality Date  . APPENDECTOMY    . COLON SURGERY    . CORONARY ANGIOPLASTY WITH STENT PLACEMENT  04/19/13   Stent-Promus DES (3.0 mm x 38  mm - 3.6 mm) to native RCA prox-mid,  . CORONARY ARTERY BYPASS GRAFT  2007   LIMA-LAD, SVG-D1, SVG-RCA  . LEFT HEART CATHETERIZATION WITH CORONARY/GRAFT ANGIOGRAM Left 04/19/2013   LEFT HEART CATH W/ CORONARY/GRAFT ANGIOGRAM;  [name], MD;  Location: [facility] CATH LAB;  LM 20%-->LAD 70-80% then subTO after D1 (Patent LIMA-LAD); Small non-dom Cx ~20% ostial.  RCA long 60-70% prox --> 90% mid.  SVG-OM & SVG-Diag 100%, diffuse Subtotal occlusion of SVG-rPD  . TONSILLECTOMY      Family History  Problem Relation Age of Onset  . Hypertension Son    Social History:  reports that he has never smoked. He  has never used smokeless tobacco. He reports that he does not drink alcohol or use drugs.  Allergies:  Allergies  Allergen Reactions  . Amiodarone Other (See Comments)    Extreme dehydration  . Diltiazem Other (See Comments)    Pt unsure of reaction  . Sulfa Antibiotics Other (See Comments)    Pt unsure of reaction    Medications:                                                                                                                           Reviewed home meds  ROS:                                                                                                                                        General ROS: negative for - chills, fatigue, fever, night sweats, weight gain or weight loss Psychological ROS: negative for - behavioral disorder, hallucinations, memory difficulties, mood swings or suicidal ideation Ophthalmic ROS: negative for - blurry vision, double vision, eye pain or loss of vision ENT ROS: negative for - epistaxis, nasal discharge, oral lesions, sore throat, tinnitus or vertigo Allergy and Immunology ROS: negative for - hives or itchy/watery eyes Hematological and Lymphatic ROS: negative for - bleeding problems, bruising or swollen lymph nodes Endocrine ROS: negative for - galactorrhea, hair pattern changes, polydipsia/polyuria or temperature  intolerance Respiratory ROS: negative for - cough, hemoptysis, shortness of breath or wheezing Cardiovascular ROS: negative for - chest pain, dyspnea on exertion, edema or irregular heartbeat Gastrointestinal ROS: negative for - abdominal pain, diarrhea, hematemesis, nausea/vomiting or stool incontinence Genito-Urinary ROS: negative for - dysuria, hematuria, incontinence or urinary frequency/urgency Musculoskeletal ROS: negative for - joint swelling or muscular weakness Neurological ROS: as noted in HPI Dermatological ROS: negative for rash and skin lesion changes   Examination:  General: Appears well-developed and well-nourished.  Psych: Affect appropriate to situation Eyes: No scleral injection HENT: No OP obstrucion Head: Normocephalic.  Cardiovascular: Normal rate and regular rhythm.  Respiratory: Effort normal and breath sounds normal to anterior ascultation GI: Soft.  No distension. There is no tenderness.  Skin: WDI   Neurological Examination Mental Status: Alert, oriented, thought content appropriate.  Speech fluent without evidence of aphasia.  Able to follow 3 step commands without difficulty. Cranial Nerves: II: Discs flat bilaterally; Visual fields grossly normal,  III,IV, VI: ptosis not present, extra-ocular motions intact bilaterally, pupils equal, round, reactive to light and accommodation V,VII: smile symmetric, facial light touch sensation normal bilaterally VIII: hearing normal bilaterally IX,X: uvula rises symmetrically XI: bilateral shoulder shrug XII: midline tongue extension Motor: Right : Upper extremity   5/5    Left:     Upper extremity   5/5  Lower extremity   5/5     Lower extremity   5/5 Tone and bulk:normal tone throughout; no atrophy noted Sensory: Pinprick and light touch intact throughout, bilaterally Deep Tendon Reflexes: 2+ and symmetric  throughout Plantars: Right: downgoing   Left: downgoing Cerebellar: normal finger-to-nose, normal rapid alternating movements and normal heel-to-shin test Gait: normal gait and station     Lab Results: Basic Metabolic Panel:  Recent Labs Lab 03/16/17 1455  NA 140  K 4.7  CL 107  CO2 24  GLUCOSE 102*  BUN 23*  CREATININE 1.17  CALCIUM 9.6    CBC:  Recent Labs Lab 03/16/17 1455  WBC 6.2  NEUTROABS 4.7  HGB 11.4*  HCT 34.5*  MCV 96.9  PLT 266    Coagulation Studies:  Recent Labs  03/16/17 1455  LABPROT 12.9  INR 0.98    Imaging: Ct Head Wo Contrast  Result Date: 03/16/2017 CLINICAL DATA:  Left-sided facial numbness of sudden onset today. EXAM: CT HEAD WITHOUT CONTRAST TECHNIQUE: Contiguous axial images were obtained from the base of the skull through the vertex without intravenous contrast. COMPARISON:  12/03/2015 FINDINGS: Brain: Mild age related involutional changes of the brain. Chronic appearing bilateral basal ganglial lacune or infarcts. Minimal small vessel ischemic disease of periventricular white matter. No large vascular territory infarct. No intracranial hemorrhage, intra-axial mass nor extra-axial fluid. No hydrocephalus Vascular: Mild atherosclerosis of the carotid siphons. Skull: Negative for fracture or focal lesions. Sinuses/Orbits: Clear bilateral mastoids and paranasal sinuses. Intact orbits and globes with bilateral lens replacements. Other: None IMPRESSION: 1. Minimal chronic small vessel ischemic disease of periventricular white matter. 2. Chronic appearing bilateral small basal ganglial lacunar infarcts. 3. No acute intracranial abnormality. Electronically Signed   By: Tollie Eth M.D.   On: 03/16/2017 16:11   Mr Shirlee Latch AS Contrast  Result Date: 03/16/2017 CLINICAL DATA:  Initial evaluation for left-sided numbness and tingling. EXAM: MRI HEAD WITHOUT CONTRAST MRA HEAD WITHOUT CONTRAST TECHNIQUE: Multiplanar, multiecho pulse sequences of the  brain and surrounding structures were obtained without intravenous contrast. Angiographic images of the head were obtained using MRA technique without contrast. COMPARISON:  Prior CT from earlier the same day. FINDINGS: MRI HEAD FINDINGS Brain: Study is limited as the patient was unable to tolerate the full length of the exam. Only diffusion-weighted sequences with sagittal T1 and limited axial FLAIR sequences were performed. Diffuse prominence of the CSF containing spaces compatible with generalized age-related cerebral atrophy. Minimal patchy T2/FLAIR hyperintensity within the periventricular white matter most likely related to chronic small vessel ischemic disease, incompletely evaluated on this exam, but overall appears  to be fairly mild in nature. No abnormal foci of restricted diffusion to suggest acute or subacute ischemia. Gray-white matter differentiation maintained. No definite areas of chronic infarction identified. No appreciable mass lesion or mass-effect on this limited exam. No hydrocephalus. No extra-axial fluid collection. Pituitary gland suprasellar region within normal limits. Midline structures intact. Vascular: Not well evaluated on this limited exam. Skull and upper cervical spine: Craniocervical junction within normal limits. Visualized upper cervical spine unremarkable. Bone marrow signal intensity within normal limits. No scalp soft tissue abnormality. Sinuses/Orbits: Globes and orbital soft tissues grossly unremarkable. Paranasal sinuses grossly clear. No definite mastoid effusion. MRA HEAD FINDINGS ANTERIOR CIRCULATION: Study degraded by motion artifact. Distal cervical segments of the internal carotid artery is are patent with antegrade flow. Petrous, cavernous, and supraclinoid segments of the internal carotid artery's widely patent without flow-limiting stenosis. ICA termini widely patent. A1 segments patent bilaterally. Anterior communicating artery normal. Anterior cerebral arteries  patent to their distal aspects without flow-limiting stenosis. M1 segments patent without stenosis or occlusion. MCA bifurcations within normal limits. No proximal M2 occlusion. Distal MCA branches well opacified and symmetric. POSTERIOR CIRCULATION: Dominant right vertebral artery widely patent to the vertebrobasilar junction. Left vertebral artery not well visualize, suspected to terminate in PICA. Partially visualized posterior inferior cerebral arteries patent. Basilar artery widely patent to its distal aspect. Superior cerebral arteries patent bilaterally. Right PCA supplied via the basilar and is widely patent to its distal aspect. Fetal type origin of the left PCA supplied via a widely patent left posterior communicating artery. Left PCA widely patent as well. No aneurysm or vascular malformation. IMPRESSION: MRI HEAD IMPRESSION: 1. Limited study as the patient was unable to tolerate the full length of the exam. 2. No acute intracranial infarct identified. No other definite acute intracranial process identified on this limited exam. 3. Mild for age chronic microvascular ischemic disease. MRA HEAD IMPRESSION: Negative intracranial MRA. No large vessel occlusion. No high-grade or correctable stenosis. No significant atheromatous disease for patient age. Electronically Signed   By: Rise Mu M.D.   On: 03/16/2017 20:05   Mr Brain Wo Contrast  Result Date: 03/16/2017 CLINICAL DATA:  Initial evaluation for left-sided numbness and tingling. EXAM: MRI HEAD WITHOUT CONTRAST MRA HEAD WITHOUT CONTRAST TECHNIQUE: Multiplanar, multiecho pulse sequences of the brain and surrounding structures were obtained without intravenous contrast. Angiographic images of the head were obtained using MRA technique without contrast. COMPARISON:  Prior CT from earlier the same day. FINDINGS: MRI HEAD FINDINGS Brain: Study is limited as the patient was unable to tolerate the full length of the exam. Only diffusion-weighted  sequences with sagittal T1 and limited axial FLAIR sequences were performed. Diffuse prominence of the CSF containing spaces compatible with generalized age-related cerebral atrophy. Minimal patchy T2/FLAIR hyperintensity within the periventricular white matter most likely related to chronic small vessel ischemic disease, incompletely evaluated on this exam, but overall appears to be fairly mild in nature. No abnormal foci of restricted diffusion to suggest acute or subacute ischemia. Gray-white matter differentiation maintained. No definite areas of chronic infarction identified. No appreciable mass lesion or mass-effect on this limited exam. No hydrocephalus. No extra-axial fluid collection. Pituitary gland suprasellar region within normal limits. Midline structures intact. Vascular: Not well evaluated on this limited exam. Skull and upper cervical spine: Craniocervical junction within normal limits. Visualized upper cervical spine unremarkable. Bone marrow signal intensity within normal limits. No scalp soft tissue abnormality. Sinuses/Orbits: Globes and orbital soft tissues grossly unremarkable. Paranasal sinuses grossly clear. No  definite mastoid effusion. MRA HEAD FINDINGS ANTERIOR CIRCULATION: Study degraded by motion artifact. Distal cervical segments of the internal carotid artery is are patent with antegrade flow. Petrous, cavernous, and supraclinoid segments of the internal carotid artery's widely patent without flow-limiting stenosis. ICA termini widely patent. A1 segments patent bilaterally. Anterior communicating artery normal. Anterior cerebral arteries patent to their distal aspects without flow-limiting stenosis. M1 segments patent without stenosis or occlusion. MCA bifurcations within normal limits. No proximal M2 occlusion. Distal MCA branches well opacified and symmetric. POSTERIOR CIRCULATION: Dominant right vertebral artery widely patent to the vertebrobasilar junction. Left vertebral artery  not well visualize, suspected to terminate in PICA. Partially visualized posterior inferior cerebral arteries patent. Basilar artery widely patent to its distal aspect. Superior cerebral arteries patent bilaterally. Right PCA supplied via the basilar and is widely patent to its distal aspect. Fetal type origin of the left PCA supplied via a widely patent left posterior communicating artery. Left PCA widely patent as well. No aneurysm or vascular malformation. IMPRESSION: MRI HEAD IMPRESSION: 1. Limited study as the patient was unable to tolerate the full length of the exam. 2. No acute intracranial infarct identified. No other definite acute intracranial process identified on this limited exam. 3. Mild for age chronic microvascular ischemic disease. MRA HEAD IMPRESSION: Negative intracranial MRA. No large vessel occlusion. No high-grade or correctable stenosis. No significant atheromatous disease for patient age. Electronically Signed   By: Rise Mu M.D.   On: 03/16/2017 20:05     ASSESSMENT AND PLAN   TIA  Risk factors: CAD s/p CABG, HTN, HLD Etiology: needs evaluation   Recommendation Mri brain was reviewed, no acute infarct.  #MRA Head : no LVO, stenosis # Carotid US #Transthoracic Echo  # Continue Plavix # BP goal: permissive HTN upto 210 systolic, PRNs above 21 # HBAIC and Lipid profile # Telemetry monitoring # Frequent neuro checks # NPO until passes stroke swallow screen  Please page stroke NP  Or  PA  Or MD from 8am -4 pm  as this patient from this time will be  followed by the stroke.   You can look them up on [email or web address]  Password [email or web address]
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
## 2017-03-16 NOTE — ED Notes (Signed)
Attempted report 

## 2017-03-16 NOTE — H&P (Signed)
History and Physical        Hospital Admission Note Date: 03/16/2017  Patient name: Joshua Chambers Medical record number: [id number] Date of birth: 01-01-1923 Age: 83 y.o. Gender: male  PCP: Marden Noble, MD    Patient coming from:   I have reviewed all records in the [facility].    Chief Complaint:  Left-sided numbness and tingling today  HPI: Patient is a 83 year old male with history of CAD status post CABG, TIA, hypertension, hyperlipidemia presented with left-sided numbness. Patient reported that he was driving back from [place] today with his wife when around 11:30AM,  he had a sudden onset of left-sided numbness. He described the numbness in his left side of the lip, left arm and fingers. The patient reported that he his wife stated that he was having slurred speech during the event. They called the EMS and the symptoms were resolved by 12:30 PM. At the time of my encounter patient does not have any slurred speech and does not report any left-sided numbness. Patient reported that he had left-sided numbness last month but did not seek medical attention as symptoms had resolved and he had stress going on at home with selling of their house. No other symptoms of headache, visual changes, gait instability.  ED work-up/course:  BP elevated 176/132 at the time of my encounter, respiratory rate 19, pulse 64, temp 97.9, O2 sats 91% on room air CBC, BMET unremarkable, hemoglobin 11.4, troponin 0 CT head showed minimal chronic small vessel ischemic disease of periventricular white matter, chronic appearing bilateral small basal ganglia lacunar infarcts, no acute intracranial abnormality EKG showed rate 57, normal sinus rhythm, PVCs, no acute ST-T wave changes  Review of Systems: Positives marked in 'bold' Constitutional: Denies fever, chills, diaphoresis, poor appetite and  fatigue.  HEENT: Denies photophobia, eye pain, redness, hearing loss, ear pain, congestion, sore throat, rhinorrhea, sneezing, mouth sores, trouble swallowing, neck pain, neck stiffness and tinnitus.   Respiratory: Denies SOB, DOE, cough, chest tightness,  and wheezing.   Cardiovascular: Denies chest pain, palpitations and leg swelling.  Gastrointestinal: Denies nausea, vomiting, abdominal pain, diarrhea, constipation, blood in stool and abdominal distention.  Genitourinary: Denies dysuria, urgency, frequency, hematuria, flank pain and difficulty urinating.  Musculoskeletal: Denies myalgias, back pain, joint swelling, arthralgias and gait problem.  Skin: Denies pallor, rash and wound.  Neurological: Please see history of present illness Hematological: Denies adenopathy. Easy bruising, personal or family bleeding history  Psychiatric/Behavioral: Denies suicidal ideation, mood changes, confusion, nervousness, sleep disturbance and agitation  Past Medical History: Past Medical History:  Diagnosis Date  . Arthritis   . CAD (coronary artery disease), autologous vein bypass graft 04/19/2013   100% flush occlusion of SVG-OM & SVG-Diag; near Subtotal Occlusion of SVG-RCA (PCI of RCA done to avoid distal embolization with PCI to SVG)  . CAD (coronary artery disease), native coronary artery 2007   Multivessel CAD referred for CABG  . CAD S/P percutaneous coronary angioplasty  04/19/2013   PCI of Native RCA - Promus Premier DES 3.0 mm x 38 mm (3.6 mm);   [name] Dyslipidemia, goal LDL below 70   . History of DVT of lower extremity  Right popliteal vein  . History of stroke August 2011   Possible TIA in 2001;  Echo: EF 45-50%, mild HK of anterior septum.  [name] Hx of orthostatic hypotension 2009   Positive tilt table:  dizziness/vasopressor type; previously on the decision   . Hx SBO   . Nephrolithiasis   . S/P CABG x 3 2007   LIMA-LAD, SVG-DIAG, SVG-RCA  . Unsteady gait     Past Surgical History:    Procedure Laterality Date  . APPENDECTOMY    . COLON SURGERY    . CORONARY ANGIOPLASTY WITH STENT PLACEMENT  04/19/13   Stent-Promus DES (3.0 mm x 38 mm - 3.6 mm) to native RCA prox-mid,  . CORONARY ARTERY BYPASS GRAFT  2007   LIMA-LAD, SVG-D1, SVG-RCA  . LEFT HEART CATHETERIZATION WITH CORONARY/GRAFT ANGIOGRAM Left 04/19/2013   LEFT HEART CATH W/ CORONARY/GRAFT ANGIOGRAM;  [name], MD;  Location: [facility] C[facility] LAB;  LM 20%-->LAD 70-80% then subTO after D1 (Patent LIMA-LAD); Small non-dom Cx ~20% ostial.  RCA long 60-70% prox --> 90% mid.  SVG-OM & SVG-Diag 100%, diffuse Subtotal occlusion of SVG-rPD  . TONSILLECTOMY      Medications: Prior to Admission medications   Medication Sig Start Date End Date Taking? Authorizing Provider  atorvastatin (LIPITOR) 40 MG tablet TAKE 1 TABLET BY MOUTH DAILY AT 6:00 PM 08/22/16  Yes [name], MD  clopidogrel (PLAVIX) 75 MG tablet TAKE 1 TABLET BY MOUTH EVERY DAY 01/02/17  Yes [name], MD  dutasteride (AVODART) 0.5 MG capsule Take 0.5 mg by mouth daily.    Yes [provider]  isosorbide mononitrate (IMDUR) 30 MG 24 hr tablet TAKE 1/2 TABLET BY MOUTH EVERY DAY 11/23/16  Yes [name], MD  metoprolol tartrate (LOPRESSOR) 25 MG tablet Take 12.5 mg by mouth daily. 03/02/17  Yes [provider]  midodrine (PROAMATINE) 10 MG tablet TAKE 1 TABLET(10 MG) BY MOUTH THREE TIMES DAILY WITH MEALS 02/13/17  Yes [name], MD  nitroGLYCERIN (NITROSTAT) 0.4 MG SL tablet Place 1 tablet (0.4 mg total) under the tongue every 5 (five) minutes as needed for chest pain. 10/11/16  Yes [name], MD  Polyvinyl Alcohol-Povidone (REFRESH OP) Place 1 drop into both eyes daily as needed.   Yes [provider]  PULMICORT FLEXHALER 180 MCG/ACT inhaler Inhale 2 puffs into the lungs daily.  07/24/14  Yes [provider]  ranitidine (ZANTAC) 150 MG tablet Take 150 mg by mouth 2 (two) times daily as needed for heartburn.     Yes [provider]    Allergies:   Allergies  Allergen Reactions  . Amiodarone Other (See Comments)    Extreme dehydration  . Diltiazem Other (See Comments)    Pt unsure of reaction  . Sulfa Antibiotics Other (See Comments)    Pt unsure of reaction    Social History:  reports that he has never smoked. He has never used smokeless tobacco. He reports that he does not drink alcohol or use drugs.  Family History: Family History  Problem Relation Age of Onset  . Hypertension Son     Physical Exam: Blood pressure (!) 176/82, pulse (!) 50, temperature 97.9 F (36.6 C), resp. rate 18, SpO2 100 %. General: Alert, awake, oriented x3, in no acute distress. Eyes: pink conjunctiva,anicteric sclera, pupils equal and reactive to light and accomodation, HEENT: normocephalic, atraumatic, oropharynx clear Neck: supple, no masses or lymphadenopathy, no goiter, no bruits, no JVD CVS: Regular rate  and rhythm, without murmurs, rubs or gallops. No lower extremity edema Resp : Clear to auscultation bilaterally, no wheezing, rales or rhonchi. GI : Soft, nontender, nondistended, positive bowel sounds, no masses. No hepatomegaly. No hernia.  Musculoskeletal: No clubbing or cyanosis, positive pedal pulses. No contracture. ROM intact  Neuro: Grossly intact, no focal neurological deficits, strength 5/5 upper and lower extremities bilaterally Psych: alert and oriented x 3, normal mood and affect Skin: no rashes or lesions, warm and dry   LABS on Admission: I have personally reviewed all the labs and imagings below    Basic Metabolic Panel:  Recent Labs Lab 03/16/17 1455  NA 140  K 4.7  CL 107  CO2 24  GLUCOSE 102*  BUN 23*  CREATININE 1.17  CALCIUM 9.6   Liver Function Tests:  Recent Labs Lab 03/16/17 1455  AST 18  ALT 13*  ALKPHOS 56  BILITOT 0.9  PROT 6.3*  ALBUMIN 3.9   No results for input(s): LIPASE, AMYLASE in the last 168 hours. No results for input(s): AMMONIA  in the last 168 hours. CBC:  Recent Labs Lab 03/16/17 1455  WBC 6.2  NEUTROABS 4.7  HGB 11.4*  HCT 34.5*  MCV 96.9  PLT 266   Cardiac Enzymes: No results for input(s): CKTOTAL, CKMB, CKMBINDEX, TROPONINI in the last 168 hours. BNP: Invalid input(s): POCBNP CBG: No results for input(s): GLUCAP in the last 168 hours.  Radiological Exams on Admission:  Ct Head Wo Contrast  Result Date: 03/16/2017 CLINICAL DATA:  Left-sided facial numbness of sudden onset today. EXAM: CT HEAD WITHOUT CONTRAST TECHNIQUE: Contiguous axial images were obtained from the base of the skull through the vertex without intravenous contrast. COMPARISON:  12/03/2015 FINDINGS: Brain: Mild age related involutional changes of the brain. Chronic appearing bilateral basal ganglial lacune or infarcts. Minimal small vessel ischemic disease of periventricular white matter. No large vascular territory infarct. No intracranial hemorrhage, intra-axial mass nor extra-axial fluid. No hydrocephalus Vascular: Mild atherosclerosis of the carotid siphons. Skull: Negative for fracture or focal lesions. Sinuses/Orbits: Clear bilateral mastoids and paranasal sinuses. Intact orbits and globes with bilateral lens replacements. Other: None IMPRESSION: 1. Minimal chronic small vessel ischemic disease of periventricular white matter. 2. Chronic appearing bilateral small basal ganglial lacunar infarcts. 3. No acute intracranial abnormality. Electronically Signed   By: Tollie Eth M.D.   On: 03/16/2017 16:11      EKG: Independently reviewed. Rate 57, normal sinus rhythm, PVCs   Assessment/Plan Principal Problem:   TIA (transient ischemic attack): In the setting of hypertension, hyperlipidemia, CAD, currently on Plavix, patient reports previous episode a month ago - Admit for observation, telemetry, per EDP, neurology consulted - CT head negative for acute CVA - Obtain MRI/MRA brain, 2-D echo, carotid Dopplers - Obtain lipid panel,  hemoglobin A1c, neurochecks - PT OT, ST evaluation - Continue Lipitor, patient is already on Plavix, will continue. Added aspirin, will follow neurology recommendations - Hold antihypertensives for now, will gradually restart   Active Problems:   Coronary artery disease involving coronary bypass graft of native heart without angina pectoris; CAD (coronary artery disease) of artery bypass graft:  loss of VG to RCA and VG-Diag - Currently stable, no chest pain or shortness of breath, - Continue Plavix, statin    Dyslipidemia, goal LDL below 70 - Obtain lipid panel, continue Lipitor    Essential hypertension - BP elevated however in the light of TIA, hold antihypertensives - will place on hydralazine as needed with parameters   DVT  prophylaxis: Lovenox  CODE STATUS: Full CODE STATUS  Consults called: Neurology  Family Communication: Admission, patients condition and plan of care including tests being ordered have been discussed with the patient who indicates understanding and agree with the plan and Code Status  Admission status: Telemetry, observation  Disposition plan: Further plan will depend as patient's clinical course evolves and further radiologic and laboratory data become available.    At the time of admission, it appears that the appropriate admission status for this patient is observation . This is judged to be reasonable and necessary in order to provide the required intensity of service to ensure the patient's safety given the presenting symptoms, physical exam findings, and initial radiographic and laboratory data in the context of their chronic comorbidities.  The medical decision making on this patient was of high complexity and the patient is at high risk for clinical deterioration, therefore this is a level 3 visit.   Time Spent on Admission: 60 minutes    Ripudeep Rai M.D. [facility] 03/16/2017, 5:30 PM Pager: [phone]  If 7PM-7AM, please contact  night-coverage [email or web address] Password [email or web address]

## 2017-03-16 NOTE — ED Notes (Signed)
Yellow band and socks applied to pt.   

## 2017-03-17 ENCOUNTER — Observation Stay (HOSPITAL_BASED_OUTPATIENT_CLINIC_OR_DEPARTMENT_OTHER): Payer: Medicare Other

## 2017-03-17 ENCOUNTER — Observation Stay (HOSPITAL_COMMUNITY): Payer: Medicare Other

## 2017-03-17 DIAGNOSIS — G451 Carotid artery syndrome (hemispheric): Secondary | ICD-10-CM | POA: Diagnosis not present

## 2017-03-17 DIAGNOSIS — I340 Nonrheumatic mitral (valve) insufficiency: Secondary | ICD-10-CM

## 2017-03-17 DIAGNOSIS — G459 Transient cerebral ischemic attack, unspecified: Secondary | ICD-10-CM

## 2017-03-17 DIAGNOSIS — E785 Hyperlipidemia, unspecified: Secondary | ICD-10-CM | POA: Diagnosis not present

## 2017-03-17 DIAGNOSIS — I25810 Atherosclerosis of coronary artery bypass graft(s) without angina pectoris: Secondary | ICD-10-CM | POA: Diagnosis not present

## 2017-03-17 DIAGNOSIS — I10 Essential (primary) hypertension: Secondary | ICD-10-CM | POA: Diagnosis not present

## 2017-03-17 LAB — ECHOCARDIOGRAM COMPLETE
CHL CUP MV DEC (S): 271
E decel time: 271 msec
FS: 24 % — AB (ref 28–44)
HEIGHTINCHES: 74 in
IVS/LV PW RATIO, ED: 0.99
LA ID, A-P, ES: 45 mm
LA diam end sys: 45 mm
LADIAMINDEX: 2.08 cm/m2
LAVOL: 76.7 mL
LAVOLA4C: 66.4 mL
LAVOLIN: 35.5 mL/m2
LV PW d: 14.2 mm — AB (ref 0.6–1.1)
LVOT area: 3.8 cm2
LVOT diameter: 22 mm
MV Peak grad: 2 mmHg
MV VTI: 177 cm
MV pk A vel: 94.1 m/s
MV pk E vel: 73.2 m/s
PISA EROA: 0.04 cm2
Reg peak vel: 190 cm/s
TAPSE: 18 mm
TRMAXVEL: 190 cm/s
WEIGHTICAEL: 3139.2 [oz_av]

## 2017-03-17 LAB — LIPID PANEL
Cholesterol: 172 mg/dL (ref 0–200)
HDL: 47 mg/dL (ref >40)
LDL CALC: 104 mg/dL — AB (ref 0–99)
Total CHOL/HDL Ratio: 3.7 RATIO
Triglycerides: 103 mg/dL (ref <150)
VLDL: 21 mg/dL (ref 0–40)

## 2017-03-17 LAB — COMPREHENSIVE METABOLIC PANEL
ALBUMIN: 3.9 g/dL (ref 3.5–5.0)
ALT: 14 U/L — AB (ref 17–63)
AST: 18 U/L (ref 15–41)
Alkaline Phosphatase: 58 U/L (ref 38–126)
Anion gap: 9 (ref 5–15)
BILIRUBIN TOTAL: 1.1 mg/dL (ref 0.3–1.2)
BUN: 15 mg/dL (ref 6–20)
CHLORIDE: 107 mmol/L (ref 101–111)
CO2: 25 mmol/L (ref 22–32)
CREATININE: 0.8 mg/dL (ref 0.61–1.24)
Calcium: 9.1 mg/dL (ref 8.9–10.3)
GFR calc Af Amer: >60 mL/min (ref >60)
GFR calc non Af Amer: >60 mL/min (ref >60)
GLUCOSE: 106 mg/dL — AB (ref 65–99)
POTASSIUM: 3.8 mmol/L (ref 3.5–5.1)
SODIUM: 141 mmol/L (ref 135–145)
Total Protein: 6.7 g/dL (ref 6.5–8.1)

## 2017-03-17 LAB — CBC
HCT: 36.2 % — ABNORMAL LOW (ref 39.0–52.0)
Hemoglobin: 11.8 g/dL — ABNORMAL LOW (ref 13.0–17.0)
MCH: 31.5 pg (ref 26.0–34.0)
MCHC: 32.6 g/dL (ref 30.0–36.0)
MCV: 96.5 fL (ref 78.0–100.0)
PLATELETS: 263 10*3/uL (ref 150–400)
RBC: 3.75 MIL/uL — AB (ref 4.22–5.81)
RDW: 12.8 % (ref 11.5–15.5)
WBC: 7.7 10*3/uL (ref 4.0–10.5)

## 2017-03-17 LAB — HEMOGLOBIN A1C
HEMOGLOBIN A1C: 5.5 % (ref 4.8–5.6)
Mean Plasma Glucose: 111.15 mg/dL

## 2017-03-17 LAB — CREATININE, SERUM
CREATININE: 1.01 mg/dL (ref 0.61–1.24)
GFR calc Af Amer: >60 mL/min (ref >60)
GFR calc non Af Amer: >60 mL/min (ref >60)

## 2017-03-17 LAB — ETHANOL: Alcohol, Ethyl (B): <5 mg/dL (ref <5)

## 2017-03-17 LAB — MAGNESIUM: Magnesium: 2.1 mg/dL (ref 1.7–2.4)

## 2017-03-17 LAB — PHOSPHORUS: Phosphorus: 2.6 mg/dL (ref 2.5–4.6)

## 2017-03-17 MED ORDER — SODIUM CHLORIDE 0.9 % IV BOLUS (SEPSIS): 500.0000 mL | Freq: Once | INTRAVENOUS | Status: AC

## 2017-03-17 MED ORDER — ASPIRIN 325 MG PO TABS: 325.0000 mg | ORAL_TABLET | Freq: Every day | ORAL | 0 refills | Status: DC

## 2017-03-17 MED ORDER — SODIUM CHLORIDE 0.9 % IV BOLUS (SEPSIS)
500.0000 mL | Freq: Once | INTRAVENOUS | Status: AC
  Administered 2017-03-17: 500 mL via INTRAVENOUS

## 2017-03-17 NOTE — Progress Notes (Signed)
Pt noted in his room demanding to go home. He pulled out his IV and took telemetry off stating that his son was coming to pick him. He does not have discharge orders at this time. Md text paged.

## 2017-03-17 NOTE — Progress Notes (Signed)
Attempted carotid duplex 4:36 PM, however patient refused. Please reorder if/when patient is willing to have test completed.   03/17/2017 4:36 PM Gertie Fey, BS, RVT, RDCS, RDMS  Vascular lab [phone]

## 2017-03-17 NOTE — Progress Notes (Signed)
  Echocardiogram 2D Echocardiogram has been performed.  Carine Nordgren G Amalio Loe 03/17/2017, 2:02 PM

## 2017-03-17 NOTE — Discharge Summary (Signed)
Physician Discharge Summary  Joshua Chambers BTC:[id number] DOB: March 27, 1923 DOA: 03/16/2017  PCP: Marden Noble, MD  Admit date: 03/16/2017 Discharge date: 03/17/2017  Admitted From: Home Disposition:  Home  Recommendations for Outpatient Follow-up:  1. Follow up with PCP in 1-2 weeks 2. Follow up with Neurology within 6 weeks 3. Please obtain CMP/CBC, Mag, Phos in one week 4. Please follow up on the following pending results:  Home Health: No  Equipment/Devices: None    Discharge Condition: Stable  CODE STATUS: FULL CODE Diet recommendation: Heart Healthy Diet  Brief/Interim Summary: Patient is a 83 year old male with history of CAD status post CABG, TIA, hypertension, hyperlipidemia and other comorbids who presented with left-sided numbness. Patient reported that he was driving back from [place] with his wife when around 11:30AM,  he had a sudden onset of left-sided numbness. He described the numbness in his left side of the lip, left arm and fingers. The patient reported that he his wife stated that he was having slurred speech during the event. They called the EMS and the symptoms were resolved by 12:30 PM.  Patient reported that he had left-sided numbness last month but did not seek medical attention as symptoms had resolved and he had stress going on at home with selling of their house. No other symptoms of headache, visual changes, gait instability. Was admitted and worked up and found to have a TIA. Neurology was consulted and recommended continuing Dual Antiplatelet Therapy and Follow up with Stroke Clinic. Patient was deemed medically stable to D/C home and will follow up with PCP and Neurology in the coming weeks.   Discharge Diagnoses:  Principal Problem:   TIA (transient ischemic attack) Active Problems:   Coronary artery disease involving coronary bypass graft of native heart without angina pectoris; CAD (coronary artery disease) of artery bypass graft:  loss of VG to RCA and  VG-Diag   Dyslipidemia, goal LDL below 70   Essential hypertension   TIA (transient ischemic attack):  -In the setting of hypertension, hyperlipidemia, CAD, currently on Plavix, patient reports previous episode a month ago - Admitted for observation, telemetry, per EDP, neurology consulted - CT head negative for acute CVA - Obtained MRI/MRA brain  -MRI Head shows Limited study as the patient was unable to tolerate the full length of the exam. No acute intracranial infarct identified. No other definite acute intracranial process identified on this limited exam. Mild for age chronic microvascular ischemic disease -MRA Head showed Negative intracranial MRA. No large vessel occlusion. No high-grade or correctable stenosis. No significant atheromatous disease for patient age. -ECHO done and showed Poor acoustic windows limit study LVEF is approximately 50 to 55% with mild inferior, inferoseptal hypokinesis. The cavity size was normal. Wall thickness was  normal. Systolic function was normal -Carotid Dopplers showed Bilateral 1-39% ICA stenosis. Antegrade vertebral flow. Elevated velocities noted in the right mid CCA, however vessel is very tortuous and no plaque is noted to suggest stenosis. -Lipid Panel showed Cholesterol of 172, HDL of 47, LDL of 104, TG of 103, VLDL of 21 - HbA1c was 5.5  - PT OT, ST evaluation and had no recc's - Continue Lipitor, patient is already on Plavix, will continue. Added aspirin 325 mg po daily, will follow neurology recommendations - Hold antihypertensives for now, will gradually restart in outpatient setting  -Follow up with Neurology Darrol Angel in 6 weeks     Coronary artery disease involving coronary bypass graft of native heart without angina pectoris; CAD (coronary artery  disease) of artery bypass graft:  loss of VG to RCA and VG-Diag - Currently stable, no chest pain or shortness of breath, - Continue Plavix, statin and added ASA 325    Dyslipidemia,  goal LDL below 70 - Lipid Panel showed Cholesterol of 172, HDL of 47, LDL of 104, TG of 103, VLDL of 21 -C/w Lipitor 40 mg po Daily     Essential hypertension - BP elevated however in the light of TIA, hold antihypertensives - will place on hydralazine as needed with parameters  Orthostatic Hypotension -Improved after Fluid Resusciation -Patient asymptomatic -C/w Midodrine and TED Hose -Follow up with PCP   Discharge Instructions  Discharge Instructions    Call MD for:  difficulty breathing, headache or visual disturbances    Complete by:  As directed    Call MD for:  extreme fatigue    Complete by:  As directed    Call MD for:  hives    Complete by:  As directed    Call MD for:  persistant dizziness or light-headedness    Complete by:  As directed    Call MD for:  persistant nausea and vomiting    Complete by:  As directed    Call MD for:  redness, tenderness, or signs of infection (pain, swelling, redness, odor or green/yellow discharge around incision site)    Complete by:  As directed    Call MD for:  severe uncontrolled pain    Complete by:  As directed    Call MD for:  temperature >100.4    Complete by:  As directed    Diet - low sodium heart healthy    Complete by:  As directed    Discharge instructions    Complete by:  As directed    Follow up with PCP and Neurology as an outpatient. Take all medications as prescribed. If symptoms change or worsen please return to the ED for evaluation.   Increase activity slowly    Complete by:  As directed      Allergies as of 03/17/2017      Reactions   Amiodarone Other (See Comments)   Extreme dehydration   Diltiazem Other (See Comments)   Pt unsure of reaction   Sulfa Antibiotics Other (See Comments)   Pt unsure of reaction      Medication List    TAKE these medications   aspirin 325 MG tablet Take 1 tablet (325 mg total) by mouth daily.   atorvastatin 40 MG tablet Commonly known as:  LIPITOR TAKE 1 TABLET BY  MOUTH DAILY AT 6:00 PM   clopidogrel 75 MG tablet Commonly known as:  PLAVIX TAKE 1 TABLET BY MOUTH EVERY DAY   dutasteride 0.5 MG capsule Commonly known as:  AVODART Take 0.5 mg by mouth daily.   isosorbide mononitrate 30 MG 24 hr tablet Commonly known as:  IMDUR TAKE 1/2 TABLET BY MOUTH EVERY DAY   metoprolol tartrate 25 MG tablet Commonly known as:  LOPRESSOR Take 12.5 mg by mouth daily.   midodrine 10 MG tablet Commonly known as:  PROAMATINE TAKE 1 TABLET(10 MG) BY MOUTH THREE TIMES DAILY WITH MEALS   nitroGLYCERIN 0.4 MG SL tablet Commonly known as:  NITROSTAT Place 1 tablet (0.4 mg total) under the tongue every 5 (five) minutes as needed for chest pain.   PULMICORT FLEXHALER 180 MCG/ACT inhaler Generic drug:  budesonide Inhale 2 puffs into the lungs daily.   ranitidine 150 MG tablet Commonly known as:  ZANTAC  Take 150 mg by mouth 2 (two) times daily as needed for heartburn.   REFRESH OP Place 1 drop into both eyes daily as needed.            Discharge Care Instructions        Start     Ordered   03/18/17 0000  aspirin 325 MG tablet  Daily     03/17/17 1748   03/17/17 0000  Increase activity slowly     03/17/17 1748   03/17/17 0000  Diet - low sodium heart healthy     03/17/17 1748   03/17/17 0000  Discharge instructions    Comments:  Follow up with PCP and Neurology as an outpatient. Take all medications as prescribed. If symptoms change or worsen please return to the ED for evaluation.   03/17/17 1748   03/17/17 0000  Call MD for:  temperature >100.4     03/17/17 1748   03/17/17 0000  Call MD for:  persistant nausea and vomiting     03/17/17 1748   03/17/17 0000  Call MD for:  severe uncontrolled pain     03/17/17 1748   03/17/17 0000  Call MD for:  redness, tenderness, or signs of infection (pain, swelling, redness, odor or green/yellow discharge around incision site)     03/17/17 1748   03/17/17 0000  Call MD for:  difficulty breathing,  headache or visual disturbances     03/17/17 1748   03/17/17 0000  Call MD for:  persistant dizziness or light-headedness     03/17/17 1748   03/17/17 0000  Call MD for:  extreme fatigue     03/17/17 1748   03/17/17 0000  Call MD for:  hives     03/17/17 1748     Follow-up Information    Marden Noble, MD. Call.   Specialty:  Internal Medicine Why:  Call to follow up within 1 week.  Contact information: [address] [place] [address] [phone]        Nilda Riggs, NP Follow up.   Specialty:  Family Medicine Why:  An appointment will be made for you by the Neurology office.  Contact information: [address] [place] [address] [phone]          Allergies  Allergen Reactions  . Amiodarone Other (See Comments)    Extreme dehydration  . Diltiazem Other (See Comments)    Pt unsure of reaction  . Sulfa Antibiotics Other (See Comments)    Pt unsure of reaction    Consultations:  Neurology  Procedures/Studies: Ct Head Wo Contrast  Result Date: 03/16/2017 CLINICAL DATA:  Left-sided facial numbness of sudden onset today. EXAM: CT HEAD WITHOUT CONTRAST TECHNIQUE: Contiguous axial images were obtained from the base of the skull through the vertex without intravenous contrast. COMPARISON:  12/03/2015 FINDINGS: Brain: Mild age related involutional changes of the brain. Chronic appearing bilateral basal ganglial lacune or infarcts. Minimal small vessel ischemic disease of periventricular white matter. No large vascular territory infarct. No intracranial hemorrhage, intra-axial mass nor extra-axial fluid. No hydrocephalus Vascular: Mild atherosclerosis of the carotid siphons. Skull: Negative for fracture or focal lesions. Sinuses/Orbits: Clear bilateral mastoids and paranasal sinuses. Intact orbits and globes with bilateral lens replacements. Other: None IMPRESSION: 1. Minimal chronic small vessel ischemic disease of  periventricular white matter. 2. Chronic appearing bilateral small basal ganglial lacunar infarcts. 3. No acute intracranial abnormality. Electronically Signed   By: Rene Kocher.D.  On: 03/16/2017 16:11   Mr Shirlee Latch WP Contrast  Result Date: 03/16/2017 CLINICAL DATA:  Initial evaluation for left-sided numbness and tingling. EXAM: MRI HEAD WITHOUT CONTRAST MRA HEAD WITHOUT CONTRAST TECHNIQUE: Multiplanar, multiecho pulse sequences of the brain and surrounding structures were obtained without intravenous contrast. Angiographic images of the head were obtained using MRA technique without contrast. COMPARISON:  Prior CT from earlier the same day. FINDINGS: MRI HEAD FINDINGS Brain: Study is limited as the patient was unable to tolerate the full length of the exam. Only diffusion-weighted sequences with sagittal T1 and limited axial FLAIR sequences were performed. Diffuse prominence of the CSF containing spaces compatible with generalized age-related cerebral atrophy. Minimal patchy T2/FLAIR hyperintensity within the periventricular white matter most likely related to chronic small vessel ischemic disease, incompletely evaluated on this exam, but overall appears to be fairly mild in nature. No abnormal foci of restricted diffusion to suggest acute or subacute ischemia. Gray-white matter differentiation maintained. No definite areas of chronic infarction identified. No appreciable mass lesion or mass-effect on this limited exam. No hydrocephalus. No extra-axial fluid collection. Pituitary gland suprasellar region within normal limits. Midline structures intact. Vascular: Not well evaluated on this limited exam. Skull and upper cervical spine: Craniocervical junction within normal limits. Visualized upper cervical spine unremarkable. Bone marrow signal intensity within normal limits. No scalp soft tissue abnormality. Sinuses/Orbits: Globes and orbital soft tissues grossly unremarkable. Paranasal sinuses grossly  clear. No definite mastoid effusion. MRA HEAD FINDINGS ANTERIOR CIRCULATION: Study degraded by motion artifact. Distal cervical segments of the internal carotid artery is are patent with antegrade flow. Petrous, cavernous, and supraclinoid segments of the internal carotid artery's widely patent without flow-limiting stenosis. ICA termini widely patent. A1 segments patent bilaterally. Anterior communicating artery normal. Anterior cerebral arteries patent to their distal aspects without flow-limiting stenosis. M1 segments patent without stenosis or occlusion. MCA bifurcations within normal limits. No proximal M2 occlusion. Distal MCA branches well opacified and symmetric. POSTERIOR CIRCULATION: Dominant right vertebral artery widely patent to the vertebrobasilar junction. Left vertebral artery not well visualize, suspected to terminate in PICA. Partially visualized posterior inferior cerebral arteries patent. Basilar artery widely patent to its distal aspect. Superior cerebral arteries patent bilaterally. Right PCA supplied via the basilar and is widely patent to its distal aspect. Fetal type origin of the left PCA supplied via a widely patent left posterior communicating artery. Left PCA widely patent as well. No aneurysm or vascular malformation. IMPRESSION: MRI HEAD IMPRESSION: 1. Limited study as the patient was unable to tolerate the full length of the exam. 2. No acute intracranial infarct identified. No other definite acute intracranial process identified on this limited exam. 3. Mild for age chronic microvascular ischemic disease. MRA HEAD IMPRESSION: Negative intracranial MRA. No large vessel occlusion. No high-grade or correctable stenosis. No significant atheromatous disease for patient age. Electronically Signed   By: Rise Mu M.D.   On: 03/16/2017 20:05   Mr Brain Wo Contrast  Result Date: 03/16/2017 CLINICAL DATA:  Initial evaluation for left-sided numbness and tingling. EXAM: MRI HEAD  WITHOUT CONTRAST MRA HEAD WITHOUT CONTRAST TECHNIQUE: Multiplanar, multiecho pulse sequences of the brain and surrounding structures were obtained without intravenous contrast. Angiographic images of the head were obtained using MRA technique without contrast. COMPARISON:  Prior CT from earlier the same day. FINDINGS: MRI HEAD FINDINGS Brain: Study is limited as the patient was unable to tolerate the full length of the exam. Only diffusion-weighted sequences with sagittal T1 and limited axial FLAIR sequences were  performed. Diffuse prominence of the CSF containing spaces compatible with generalized age-related cerebral atrophy. Minimal patchy T2/FLAIR hyperintensity within the periventricular white matter most likely related to chronic small vessel ischemic disease, incompletely evaluated on this exam, but overall appears to be fairly mild in nature. No abnormal foci of restricted diffusion to suggest acute or subacute ischemia. Gray-white matter differentiation maintained. No definite areas of chronic infarction identified. No appreciable mass lesion or mass-effect on this limited exam. No hydrocephalus. No extra-axial fluid collection. Pituitary gland suprasellar region within normal limits. Midline structures intact. Vascular: Not well evaluated on this limited exam. Skull and upper cervical spine: Craniocervical junction within normal limits. Visualized upper cervical spine unremarkable. Bone marrow signal intensity within normal limits. No scalp soft tissue abnormality. Sinuses/Orbits: Globes and orbital soft tissues grossly unremarkable. Paranasal sinuses grossly clear. No definite mastoid effusion. MRA HEAD FINDINGS ANTERIOR CIRCULATION: Study degraded by motion artifact. Distal cervical segments of the internal carotid artery is are patent with antegrade flow. Petrous, cavernous, and supraclinoid segments of the internal carotid artery's widely patent without flow-limiting stenosis. ICA termini widely  patent. A1 segments patent bilaterally. Anterior communicating artery normal. Anterior cerebral arteries patent to their distal aspects without flow-limiting stenosis. M1 segments patent without stenosis or occlusion. MCA bifurcations within normal limits. No proximal M2 occlusion. Distal MCA branches well opacified and symmetric. POSTERIOR CIRCULATION: Dominant right vertebral artery widely patent to the vertebrobasilar junction. Left vertebral artery not well visualize, suspected to terminate in PICA. Partially visualized posterior inferior cerebral arteries patent. Basilar artery widely patent to its distal aspect. Superior cerebral arteries patent bilaterally. Right PCA supplied via the basilar and is widely patent to its distal aspect. Fetal type origin of the left PCA supplied via a widely patent left posterior communicating artery. Left PCA widely patent as well. No aneurysm or vascular malformation. IMPRESSION: MRI HEAD IMPRESSION: 1. Limited study as the patient was unable to tolerate the full length of the exam. 2. No acute intracranial infarct identified. No other definite acute intracranial process identified on this limited exam. 3. Mild for age chronic microvascular ischemic disease. MRA HEAD IMPRESSION: Negative intracranial MRA. No large vessel occlusion. No high-grade or correctable stenosis. No significant atheromatous disease for patient age. Electronically Signed   By: Rise Mu M.D.   On: 03/16/2017 20:05    ECHOCARDIOGRAM Study Conclusions  - Left ventricle: Poor acoustic windows limit study LVEF is   approximately 50 to 55% with mild inferior, inferoseptal   hypokinesis. The cavity size was normal. Wall thickness was   normal. Systolic function was normal. The estimated ejection   fraction was in the range of 50% to 55%. - Aortic valve: There was trivial regurgitation. - Mitral valve: There was mild regurgitation  Subjective: Seen and examined at bedside and was doing  better. No complaints and ready to go home.   Discharge Exam: Vitals:   03/17/17 1420 03/17/17 1500  BP: 120/68   Pulse: 80   Resp: 18   Temp: 98.6 F (37 C)   SpO2:  91%   Vitals:   03/17/17 1100 03/17/17 1200 03/17/17 1420 03/17/17 1500  BP: (!) 91/48 (!) 124/97 120/68   Pulse: 82 78 80   Resp:  16 18   Temp: 97.7 F (36.5 C) 98.7 F (37.1 C) 98.6 F (37 C)   TempSrc: Oral Oral Oral   SpO2:    91%  Weight:      Height:       General: Pt is  alert, awake, not in acute distress Cardiovascular: RRR, S1/S2 +, no rubs, no gallops Respiratory: CTA bilaterally, no wheezing, no rhonchi Abdominal: Soft, NT, ND, bowel sounds + Extremities: no edema, no cyanosis  The results of significant diagnostics from this hospitalization (including imaging, microbiology, ancillary and laboratory) are listed below for reference.    Microbiology: No results found for this or any previous visit (from the past 240 hour(s)).   Labs: BNP (last 3 results) No results for input(s): BNP in the last 8760 hours. Basic Metabolic Panel:  Recent Labs Lab 03/16/17 1455 03/17/17 0006 03/17/17 0930  NA 140  --  141  K 4.7  --  3.8  CL 107  --  107  CO2 24  --  25  GLUCOSE 102*  --  106*  BUN 23*  --  15  CREATININE 1.17 1.01 0.80  CALCIUM 9.6  --  9.1  MG  --   --  2.1  PHOS  --   --  2.6   Liver Function Tests:  Recent Labs Lab 03/16/17 1455 03/17/17 0930  AST 18 18  ALT 13* 14*  ALKPHOS 56 58  BILITOT 0.9 1.1  PROT 6.3* 6.7  ALBUMIN 3.9 3.9   No results for input(s): LIPASE, AMYLASE in the last 168 hours. No results for input(s): AMMONIA in the last 168 hours. CBC:  Recent Labs Lab 03/16/17 1455 03/17/17 0006  WBC 6.2 7.7  NEUTROABS 4.7  --   HGB 11.4* 11.8*  HCT 34.5* 36.2*  MCV 96.9 96.5  PLT 266 263   Cardiac Enzymes: No results for input(s): CKTOTAL, CKMB, CKMBINDEX, TROPONINI in the last 168 hours. BNP: Invalid input(s): POCBNP CBG: No results for input(s):  GLUCAP in the last 168 hours. D-Dimer No results for input(s): DDIMER in the last 72 hours. Hgb A1c  Recent Labs  03/17/17 0006  HGBA1C 5.5   Lipid Profile  Recent Labs  03/17/17 0006  CHOL 172  HDL 47  LDLCALC 104*  TRIG 103  CHOLHDL 3.7   Thyroid function studies No results for input(s): TSH, T4TOTAL, T3FREE, THYROIDAB in the last 72 hours.  Invalid input(s): FREET3 Anemia work up No results for input(s): VITAMINB12, FOLATE, FERRITIN, TIBC, IRON, RETICCTPCT in the last 72 hours. Urinalysis    Component Value Date/Time   COLORURINE YELLOW 03/16/2017 1630   APPEARANCEUR CLEAR 03/16/2017 1630   LABSPEC 1.012 03/16/2017 1630   PHURINE 7.0 03/16/2017 1630   GLUCOSEU NEGATIVE 03/16/2017 1630   HGBUR NEGATIVE 03/16/2017 1630   BILIRUBINUR NEGATIVE 03/16/2017 1630   BILIRUBINUR large 04/23/2013 1720   KETONESUR NEGATIVE 03/16/2017 1630   PROTEINUR NEGATIVE 03/16/2017 1630   UROBILINOGEN >=8.0 04/23/2013 1720   UROBILINOGEN 0.2 03/31/2010 1614   NITRITE NEGATIVE 03/16/2017 1630   LEUKOCYTESUR NEGATIVE 03/16/2017 1630   Sepsis Labs Invalid input(s): PROCALCITONIN,  WBC,  LACTICIDVEN Microbiology No results found for this or any previous visit (from the past 240 hour(s)).  Time coordinating discharge: 35 minutes  SIGNED:  Merlene Laughter, DO [facility] 03/17/2017, 5:49 PM Pager [phone]  If 7PM-7AM, please contact night-coverage [email or web address] Password [email or web address]

## 2017-03-17 NOTE — Evaluation (Signed)
Physical Therapy Evaluation Patient Details Name: Joshua Chambers MRN: [id number] DOB: [birth year] Today's Date: 03/17/2017   History of Present Illness  Pt is a 83 yo male admitted with L facial droop, L arm numbness and slurred speech that lasted 1 hour and resolved. Pt with PMH of CAD, CABG, TIA, HTN.  CT and MRI -  Clinical Impression  Pt is at or close to baseline functioning and should be safe at home with/without assist from his wife. There are no further acute PT needs.  Will sign off at this time.     Follow Up Recommendations No PT follow up    Equipment Recommendations  None recommended by PT    Recommendations for Other Services       Precautions / Restrictions Precautions Precautions: Fall Precaution Comments: Pt mildly unsteady on his feet but states it takes him a few minutes to get up and going in the morning.  Pt with good safety awareness listing all the things he does to avoid falling.  No reports of falls.      Mobility  Bed Mobility Overal bed mobility: Modified Independent                Transfers Overall transfer level: Needs assistance Equipment used: Straight cane Transfers: Sit to/from Stand Sit to Stand: Supervision         General transfer comment: S just for safety since pt had not been up since admission  Ambulation/Gait Ambulation/Gait assistance: Supervision Ambulation Distance (Feet): 130 Feet Assistive device: Straight cane Gait Pattern/deviations: Step-through pattern   Gait velocity interpretation: Below normal speed for age/gender General Gait Details: generally steady after short warm up.  Safe use of the cane.  Stairs Stairs: Yes   Stair Management: One rail Right;Alternating pattern;Forwards;With cane Number of Stairs: 3 General stair comments: safe with rail  Wheelchair Mobility    Modified Rankin (Stroke Patients Only)       Balance Overall balance assessment: Needs assistance Sitting-balance support:  Feet supported Sitting balance-Leahy Scale: Good       Standing balance-Leahy Scale: Good                               Pertinent Vitals/Pain Pain Assessment: No/denies pain    Home Living Family/patient expects to be discharged to:: Private residence Living Arrangements: Spouse/significant other Available Help at Discharge: Family;Available 24 hours/day Type of Home: House Home Access: Ramped entrance     Home Layout: Able to live on main level with bedroom/bathroom Home Equipment: Walker - 2 wheels;Cane - single point;Shower seat - built in;Other (comment) Additional Comments: Pt trying to sell house to move to townhome in [place] that is smaller and more manageable.  Sons Company secretary, wife does house work and pt pays bills and drives.    Prior Function Level of Independence: Independent with assistive device(s)         Comments: uses cane to walk     Hand Dominance   Dominant Hand: Right    Extremity/Trunk Assessment        Lower Extremity Assessment Lower Extremity Assessment: Overall WFL for tasks assessed (bil df weakness)       Communication   Communication: HOH  Cognition Arousal/Alertness: Awake/alert Behavior During Therapy: WFL for tasks assessed/performed Overall Cognitive Status: Within Functional Limits for tasks assessed  General Comments      Exercises     Assessment/Plan    PT Assessment Patent does not need any further PT services  PT Problem List Decreased balance       PT Treatment Interventions      PT Goals (Current goals can be found in the Care Plan section)  Acute Rehab PT Goals Patient Stated Goal: to go home today PT Goal Formulation: With patient    Frequency     Barriers to discharge        Co-evaluation               AM-PAC PT "6 Clicks" Daily Activity  Outcome Measure Difficulty turning over in bed (including adjusting bedclothes,  sheets and blankets)?: A Little Difficulty moving from lying on back to sitting on the side of the bed? : A Little Difficulty sitting down on and standing up from a chair with arms (e.g., wheelchair, bedside commode, etc,.)?: A Little Help needed moving to and from a bed to chair (including a wheelchair)?: A Little Help needed walking in hospital room?: A Little Help needed climbing 3-5 steps with a railing? : A Little 6 Click Score: 18    End of Session   Activity Tolerance: Patient tolerated treatment well Patient left: in bed;with call bell/phone within reach Nurse Communication: Mobility status PT Visit Diagnosis: Unsteadiness on feet (R26.81)    Time: 5361-4431 PT Time Calculation (min) (ACUTE ONLY): 19 min   Charges:   PT Evaluation $PT Eval Moderate Complexity: 1 Mod     PT G Codes:   PT G-Codes **NOT FOR INPATIENT CLASS** Functional Assessment Tool Used: AM-PAC 6 Clicks Basic Mobility;Clinical judgement Functional Limitation: Mobility: Walking and moving around Mobility: Walking and Moving Around Current Status (V4008): At least 1 percent but less than 20 percent impaired, limited or restricted Mobility: Walking and Moving Around Goal Status [phone]): At least 1 percent but less than 20 percent impaired, limited or restricted Mobility: Walking and Moving Around Discharge Status [phone]): At least 1 percent but less than 20 percent impaired, limited or restricted    03/17/2017  [name] Bing, PT [phone]  (pager)  Eliseo Gum Jaliyah Fotheringham 03/17/2017, 3:01 PM

## 2017-03-17 NOTE — Progress Notes (Signed)
Pt educated on the importance of compliance with Md orders but he continued to refuse. Md in his room at this time.

## 2017-03-17 NOTE — Progress Notes (Signed)
Reviewed discharge instructions with pt. Pt verbalized understanding. Pt went home with son.

## 2017-03-17 NOTE — Progress Notes (Signed)
STROKE TEAM PROGRESS NOTE   HISTORY OF PRESENT ILLNESS (per record) Joshua Chambers is an 83 y.o. male with a history of CAD status post CABG, TIA in 2011, DVT, hypertension, hyperlipidemia, unsteady gait, and orthostatic hypotension who presented with slurred speech, left facial droop, and left arm paresis.  He noticed around 11 am on 03/16/2017 that his speech was slurred, and his wife noticed he had left facial droop and he felt his  left arm went dead, lasted for close to an hour. Symptoms had resolved by time patient arrived to ER. Says in the past he has intermittent numbness of left arm.  Denies visual changes, aphasia, gait imbalance.    Date last known well: 03/16/17 Time last known well: 11 am NIHSS; 0                                                                                  Modified Rankin:0  Patient was not administered IV t-PA secondary to arriving outside of the treatment window/resolution of symptoms. He was admitted to General Neurology for further evaluation and treatment.   SUBJECTIVE (INTERVAL HISTORY) No family is at the bedside.  He stated that his slurry speech and left arm and facial weakness lasted whole day instead of one hour documented in chart. MRI neg. Pt now at baseline. He is following with cardiology Dr. Herbie Baltimore.    OBJECTIVE Temp:  [97.8 F (36.6 C)-98.6 F (37 C)] 97.8 F (36.6 C) (08/31 0800) Pulse Rate:  [37-106] 78 (08/31 0800) Cardiac Rhythm: Normal sinus rhythm (08/30 2131) Resp:  [14-22] 16 (08/31 0800) BP: (100-178)/(56-101) 156/92 (08/31 0800) SpO2:  [95 %-100 %] 97 % (08/31 0800) Weight:  [89 kg (196 lb 3.2 oz)] 89 kg (196 lb 3.2 oz) (08/30 2113)  CBC:  Recent Labs Lab 03/16/17 1455 03/17/17 0006  WBC 6.2 7.7  NEUTROABS 4.7  --   HGB 11.4* 11.8*  HCT 34.5* 36.2*  MCV 96.9 96.5  PLT 266 263    Basic Metabolic Panel:  Recent Labs Lab 03/16/17 1455 03/17/17 0006  NA 140  --   K 4.7  --   CL 107  --   CO2 24  --   GLUCOSE  102*  --   BUN 23*  --   CREATININE 1.17 1.01  CALCIUM 9.6  --     Lipid Panel:    Component Value Date/Time   CHOL 172 03/17/2017 0006   TRIG 103 03/17/2017 0006   HDL 47 03/17/2017 0006   CHOLHDL 3.7 03/17/2017 0006   VLDL 21 03/17/2017 0006   LDLCALC 104 (H) 03/17/2017 0006   HgbA1c:  Lab Results  Component Value Date   HGBA1C 5.5 03/17/2017   Urine Drug Screen: No results found for: LABOPIA, COCAINSCRNUR, LABBENZ, AMPHETMU, THCU, LABBARB  Alcohol Level     Component Value Date/Time   ETH <5 03/17/2017 0006    IMAGING I have personally reviewed the radiological images below and agree with the radiology interpretations.  Ct Head Wo Contrast 03/16/2017 IMPRESSION: 1. Minimal chronic small vessel ischemic disease of periventricular white matter. 2. Chronic appearing bilateral small basal ganglial lacunar infarcts. 3. No acute intracranial abnormality.  Mr Joshua Chambers Head Wo Contrast Mr Brain Wo Contrast 03/16/2017 IMPRESSION: MRI HEAD IMPRESSION: 1. Limited study as the patient was unable to tolerate the full length of the exam. 2. No acute intracranial infarct identified. No other definite acute intracranial process identified on this limited exam. 3. Mild for age chronic microvascular ischemic disease. MRA HEAD IMPRESSION: Negative intracranial MRA. No large vessel occlusion. No high-grade or correctable stenosis. No significant atheromatous disease for patient age.  TTE 03/17/2017 - Left ventricle: Poor acoustic windows limit study LVEF is   approximately 50 to 55% with mild inferior, inferoseptal   hypokinesis. The cavity size was normal. Wall thickness was   normal. Systolic function was normal. The estimated ejection   fraction was in the range of 50% to 55%. - Aortic valve: There was trivial regurgitation. - Mitral valve: There was mild regurgitation.  Carotid US 03/17/2017 Bilateral 1-39% ICA stenosis. Antegrade vertebral flow. Elevated velocities noted in the right  mid CCA, however vessel is very tortuous and no plaque is noted to suggest stenosis.   PHYSICAL EXAM  Temp:  [97.7 F (36.5 C)-98.7 F (37.1 C)] 98.6 F (37 C) (08/31 1420) Pulse Rate:  [78-82] 80 (08/31 1420) Resp:  [16-18] 18 (08/31 1420) BP: (91-124)/(48-97) 120/68 (08/31 1420) SpO2:  [91 %] 91 % (08/31 1500)  General - Well nourished, well developed, in no apparent distress.  Ophthalmologic - Fundi not visualized due to noncooperation.  Cardiovascular - Regular rate and rhythm.  Mental Status -  Level of arousal and orientation to time, place, and person were intact. Language including expression, naming, repetition, comprehension was assessed and found intact. Fund of Knowledge was assessed and was intact.  Cranial Nerves II - XII - II - Visual field intact OU. III, IV, VI - Extraocular movements intact. V - Facial sensation intact bilaterally. VII - Facial movement intact bilaterally. VIII - Hearing & vestibular intact bilaterally. X - Palate elevates symmetrically. XI - Chin turning & shoulder shrug intact bilaterally. XII - Tongue protrusion intact.  Motor Strength - The patient's strength was normal in all extremities and pronator drift was absent.  Bulk was normal and fasciculations were absent.   Motor Tone - Muscle tone was assessed at the neck and appendages and was normal.  Reflexes - The patient's reflexes were 1+ in all extremities and he had no pathological reflexes.  Sensory - Light touch, temperature/pinprick, vibration and proprioception were assessed and were symmetrical.    Coordination - The patient had normal movements in the hands and feet with no ataxia or dysmetria.  Tremor was absent.  Gait and Station - referral   ASSESSMENT/PLAN Mr. Joshua Chambers is a 83 y.o. male with history of CAD status post CABG, TIA in 2011, DVT, hypertension, hyperlipidemia, unsteady gait, and orthostatic hypotension who presented with slurred speech, left facial  droop, and left arm paresis. He did not receive IV t-PA due to resolution of symptoms/arriving outside of the treatment window.  TIA - likely subcortical TIA  Resultant  Neuro deficit resolved  CT head: No acute stroke  MRI head: Limited exam but no acute stroke  MRA head: No LVO or high-grade stenosis  Carotid Doppler: unremarkable  2D Echo: EF 50-55%  LDL 104  HgbA1c 5.5  Lovenox 40 mg sq daily for VTE prophylaxis  Diet Heart Room service appropriate? Yes; Fluid consistency: Thin  clopidogrel 75 mg daily prior to admission, now on aspirin 325 mg daily and clopidogrel 75 mg daily. Continue DAPT for 3  months and then plavix alone as he failed plavix alone and according to POINT trial recommendations.  Patient counseled to be compliant with his antithrombotic medications  Ongoing aggressive stroke risk factor management  Therapy recommendations:  none  Disposition:  home  Hypertension  Stable Long-term BP goal normotensive  Hyperlipidemia  Home meds: atorvastatin 40 mg PO daily, resumed in hospital  LDL 104, goal < 70  Continue statin at discharge  Other Stroke Risk Factors  Advanced age  Coronary artery disease s/p CABG - following with Dr. Herbie Baltimore  Hx of TIA  Other Active Problems  None  Hospital day # 0  Neurology will sign off. Please call with questions. Pt will follow up with Darrol Angel, NP, at [facility] in about 6 weeks. Thanks for the consult.  Marvel Plan, MD PhD Stroke Neurology 03/18/2017 8:57 AM   To contact Stroke Continuity provider, please refer to [email or web address]. After hours, contact General Neurology

## 2017-03-17 NOTE — Evaluation (Signed)
Speech Language Pathology Evaluation Patient Details Name: Joshua Chambers MRN: [id number] DOB: [birth year] Today's Date: 03/17/2017 Time: 9678-9381 SLP Time Calculation (min) (ACUTE ONLY): 31 min  Problem List:  Patient Active Problem List   Diagnosis Date Noted  . TIA (transient ischemic attack) 03/16/2017  . Sinus arrhythmia 09/09/2016  . Premature atrial contractions 07/28/2015  . Hematuria 05/02/2013  . Essential hypertension 05/02/2013  . Dyspnea 04/30/2013  . Orthostatic hypotension 04/20/2013  . Coronary artery disease involving coronary bypass graft of native heart without angina pectoris; CAD (coronary artery disease) of artery bypass graft:  loss of VG to RCA and VG-Diag 04/17/2013  . CAD S/P percutaneous coronary angioplasty - PCI of native RCA; Promus DES 3.0 mm x38 mm (3.6 mm) 04/17/2013  . Other and unspecified coagulation defects 04/17/2013  . Preoperative examination, unspecified 04/17/2013  . Fatigue 04/17/2013  . Dyslipidemia, goal LDL below 70   . S/P CABG x 4; LIMA-LAD, SVG-RPDA, SVG-OM, SVG-D1 -- all but LIMA-LAD occluded 03/18/2006    Class: Chronic   Past Medical History:  Past Medical History:  Diagnosis Date  . Arthritis   . CAD (coronary artery disease), autologous vein bypass graft 04/19/2013   100% flush occlusion of SVG-OM & SVG-Diag; near Subtotal Occlusion of SVG-RCA (PCI of RCA done to avoid distal embolization with PCI to SVG)  . CAD (coronary artery disease), native coronary artery 2007   Multivessel CAD referred for CABG  . CAD S/P percutaneous coronary angioplasty  04/19/2013   PCI of Native RCA - Promus Premier DES 3.0 mm x 38 mm (3.6 mm);   [name] Dyslipidemia, goal LDL below 70   . History of DVT of lower extremity    Right popliteal vein  . History of stroke August 2011   Possible TIA in 2001;  Echo: EF 45-50%, mild HK of anterior septum.  [name] Hx of orthostatic hypotension 2009   Positive tilt table:  dizziness/vasopressor type; previously on  the decision   . Hx SBO   . Nephrolithiasis   . S/P CABG x 3 2007   LIMA-LAD, SVG-DIAG, SVG-RCA  . Unsteady gait    Past Surgical History:  Past Surgical History:  Procedure Laterality Date  . APPENDECTOMY    . COLON SURGERY    . CORONARY ANGIOPLASTY WITH STENT PLACEMENT  04/19/13   Stent-Promus DES (3.0 mm x 38 mm - 3.6 mm) to native RCA prox-mid,  . CORONARY ARTERY BYPASS GRAFT  2007   LIMA-LAD, SVG-D1, SVG-RCA  . LEFT HEART CATHETERIZATION WITH CORONARY/GRAFT ANGIOGRAM Left 04/19/2013   LEFT HEART CATH W/ CORONARY/GRAFT ANGIOGRAM;  [name], MD;  Location: [facility] CATH LAB;  LM 20%-->LAD 70-80% then subTO after D1 (Patent LIMA-LAD); Small non-dom Cx ~20% ostial.  RCA long 60-70% prox --> 90% mid.  SVG-OM & SVG-Diag 100%, diffuse Subtotal occlusion of SVG-rPD  . TONSILLECTOMY     HPI:  Pt is a 83 yo male admitted with L facial droop, L arm numbness and slurred speech that lasted 1 hour and resolved. Pt with PMH of CAD, CABG, TIA, HTN.  CT and MRI -negative.  Pt reports having similar event approximately one year ago.  He is married and resides with his wife of 7 years.     Assessment / Plan / Recommendation Clinical Impression  Pt presents with functional cognitive linguistic abilities.  He reports resolution of dysarthria and facial droop.  Pt able to carry on high level conversation, follow complex conversation re: NPO while examining menu.  Working memory intact for recalling phone number for ordering food items.  Repetition intact for complex sentences and pt with no language deficits.   Pt has 2 Master's degrees and reports he and his wife share home duties.    No SLP follow up warranted as pt at baseline function.      SLP Assessment  SLP Recommendation/Assessment: Patient does not need any further Speech Lanaguage Pathology Services SLP Visit Diagnosis: Cognitive communication deficit (R41.841)    Follow Up Recommendations  None    Frequency and Duration     n/a       SLP Evaluation Cognition  Overall Cognitive Status: Within Functional Limits for tasks assessed Arousal/Alertness: Awake/alert Orientation Level: Disoriented to time;Oriented to person;Oriented to place;Oriented to situation (to specific date with assist) Attention: Selective Selective Attention: Appears intact Memory: Appears intact (working memory for phone number recall) Awareness: Appears intact (awareness intact) Problem Solving: Appears intact (figuring out why NPO, pt stating he does not recall plan for a procedure, using math to figure out date - counting days since admit) Safety/Judgment: Appears intact       Comprehension  Auditory Comprehension Overall Auditory Comprehension: Appears within functional limits for tasks assessed Yes/No Questions: Not tested Commands: Within Functional Limits Conversation: Complex Interfering Components: Hearing EffectiveTechniques:  (pt has a hearing aid) Visual Recognition/Discrimination Discrimination: Within Function Limits Reading Comprehension Reading Status: Within funtional limits (for menu)    Expression Expression Primary Mode of Expression: Verbal Verbal Expression Overall Verbal Expression: Appears within functional limits for tasks assessed Initiation: No impairment Repetition: No impairment Naming: Not tested Pragmatics: No impairment Non-Verbal Means of Communication: Not applicable Written Expression Dominant Hand: Right Written Expression: Not tested   Oral / Motor  Oral Motor/Sensory Function Overall Oral Motor/Sensory Function: Within functional limits Motor Speech Overall Motor Speech: Appears within functional limits for tasks assessed Respiration: Within functional limits Resonance: Within functional limits Articulation: Within functional limitis Motor Planning: Witnin functional limits   GO          Functional Assessment Tool Used: clinical judgement Functional Limitations: Spoken language  expressive Spoken Language Expression Current Status (B1517): 0 percent impaired, limited or restricted Spoken Language Expression Goal Status (O1607): 0 percent impaired, limited or restricted Spoken Language Expression Discharge Status [phone]): 0 percent impaired, limited or restricted         Chales Abrahams 03/17/2017, 10:46 AM Donavan Burnet, MS [facility] SLP [phone]

## 2017-03-17 NOTE — Progress Notes (Signed)
*  PRELIMINARY RESULTS* Vascular Ultrasound Carotid Duplex (Doppler) has been completed.  Preliminary findings: Bilateral 1-39% ICA stenosis. Antegrade vertebral flow. Elevated velocities noted in the right mid CCA, however vessel is very tortuous and no plaque is noted to suggest stenosis.  Farrel Demark, RDMS, RVT  03/17/2017, 5:02 PM

## 2017-03-17 NOTE — Evaluation (Signed)
Occupational Therapy Evaluation and Discharge Summary Patient Details Name: Joshua Chambers MRN: [id number] DOB: 10-Aug-1922 Today's Date: 03/17/2017    History of Present Illness Pt is a 83 yo male admitted with L facial droop, L arm numbness and slurred speech that lasted 1 hour and resolved. Pt with PMH of CAD, CABG, TIA, HTN.  CT and MRI -   Clinical Impression   Pt admitted with the above diagnosis and overall is very close to baseline.  Pt with some general weakness from not being up for the last 24 hours which initially made him unsteady when on his feet but the further he walked the steadier he got.  Feel pt could d/c home with his wife.  Safety instructions given re: throw rugs and taking his time getting up first thing in the morning.  Signs and symptoms of stroke reviewed as well as BE FAST.  Feel pt would benefit from one session of PT to assess longer range ambulation.  Pt is in agreement.    Follow Up Recommendations  Supervision/Assistance - 24 hour;No OT follow up    Equipment Recommendations  None recommended by OT    Recommendations for Other Services       Precautions / Restrictions Precautions Precautions: Fall Precaution Comments: Pt mildly unsteady on his feet but states it takes him a few minutes to get up and going in the morning.  Pt with good safety awareness listing all the things he does to avoid falling.  No reports of falls. Restrictions Weight Bearing Restrictions: No      Mobility Bed Mobility Overal bed mobility: Modified Independent             General bed mobility comments: extra time and used bedrails.  Transfers Overall transfer level: Needs assistance Equipment used: Straight cane Transfers: Sit to/from Stand Sit to Stand: Supervision         General transfer comment: S just for safety since pt had not been up since admission    Balance Overall balance assessment: Needs assistance Sitting-balance support: Feet supported Sitting  balance-Leahy Scale: Good     Standing balance support: Single extremity supported Standing balance-Leahy Scale: Good Standing balance comment: Pt stood at sink and groomed with distant supervision                           ADL either performed or assessed with clinical judgement   ADL Overall ADL's : At baseline                                       General ADL Comments: Pt generally at baseline. Pt slighly unsteady on his feet when first getting up. The further pt walked, the steadier he became.       Vision Baseline Vision/History: No visual deficits Patient Visual Report: No change from baseline Vision Assessment?: No apparent visual deficits     Perception     Praxis      Pertinent Vitals/Pain Pain Assessment: No/denies pain     Hand Dominance Right   Extremity/Trunk Assessment Upper Extremity Assessment Upper Extremity Assessment: Overall WFL for tasks assessed (states has had 1 year h/o of on and off numbness in LUE)   Lower Extremity Assessment Lower Extremity Assessment: Defer to PT evaluation   Cervical / Trunk Assessment Cervical / Trunk Assessment: Normal   Communication Communication Communication: [facility]  Cognition Arousal/Alertness: Awake/alert Behavior During Therapy: WFL for tasks assessed/performed Overall Cognitive Status: Within Functional Limits for tasks assessed                                 General Comments: appears generally intact   General Comments  Feel pt is weak from being in hospital but most likely close to or at baseline.    Exercises     Shoulder Instructions      Home Living Family/patient expects to be discharged to:: Private residence Living Arrangements: Spouse/significant other Available Help at Discharge: Family;Available 24 hours/day Type of Home: House Home Access: Ramped entrance     Home Layout: Able to live on main level with bedroom/bathroom     Bathroom  Shower/Tub: Walk-in shower;Door   [employer] Toilet: Standard     Home Equipment: Environmental consultant - 2 wheels;Cane - single point;Shower seat - built in;Other (comment) (lift chair)   Additional Comments: Pt trying to sell house to move to townhome in [place] that is smaller and more manageable.  Sons Company secretary, wife does house work and pt pays bills and drives.      Prior Functioning/Environment Level of Independence: Independent with assistive device(s)        Comments: uses cane to walk        OT Problem List:        OT Treatment/Interventions:      OT Goals(Current goals can be found in the care plan section) Acute Rehab OT Goals Patient Stated Goal: to go home today OT Goal Formulation: All assessment and education complete, DC therapy  OT Frequency:     Barriers to D/C:            Co-evaluation              AM-PAC PT "6 Clicks" Daily Activity     Outcome Measure Help from another person eating meals?: None Help from another person taking care of personal grooming?: None Help from another person toileting, which includes using toliet, bedpan, or urinal?: A Little Help from another person bathing (including washing, rinsing, drying)?: None Help from another person to put on and taking off regular upper body clothing?: None Help from another person to put on and taking off regular lower body clothing?: A Little 6 Click Score: 22   End of Session Equipment Utilized During Treatment:  (cane) Nurse Communication: Mobility status  Activity Tolerance: Patient tolerated treatment well Patient left: in chair;with call bell/phone within reach;with chair alarm set  OT Visit Diagnosis: Unsteadiness on feet (R26.81)                Time: 9811-9147 OT Time Calculation (min): 27 min Charges:  OT General Charges $OT Visit: 1 Visit OT Evaluation $OT Eval Moderate Complexity: 1 Mod OT Treatments $Self Care/Home Management : 8-22 mins G-Codes: OT G-codes **NOT FOR INPATIENT  CLASS** Functional Assessment Tool Used: Clinical judgement Functional Limitation: Self care Self Care Current Status (W2956): At least 1 percent but less than 20 percent impaired, limited or restricted Self Care Goal Status (O1308): At least 1 percent but less than 20 percent impaired, limited or restricted Self Care Discharge Status [id number]): At least 1 percent but less than 20 percent impaired, limited or restricted   Tory Emerald, OTR/L  Hope Budds 03/17/2017, 10:24 AM

## 2017-03-18 ENCOUNTER — Encounter: Payer: Self-pay | Admitting: Cardiology

## 2017-03-18 LAB — VAS US CAROTID
LCCADDIAS: 18 cm/s
LCCADSYS: 87 cm/s
LEFT ECA DIAS: -17 cm/s
LEFT VERTEBRAL DIAS: 10 cm/s
LICADDIAS: -31 cm/s
LICADSYS: -86 cm/s
LICAPDIAS: -18 cm/s
LICAPSYS: -102 cm/s
Left CCA prox dias: 17 cm/s
Left CCA prox sys: 96 cm/s
RIGHT CCA MID DIAS: 31 cm/s
RIGHT ECA DIAS: -10 cm/s
RIGHT VERTEBRAL DIAS: 11 cm/s
Right CCA prox dias: 13 cm/s
Right CCA prox sys: 104 cm/s
Right cca dist sys: -79 cm/s

## 2017-03-18 NOTE — Assessment & Plan Note (Signed)
No longer an issue - actually weaning off of BB

## 2017-03-18 NOTE — Assessment & Plan Note (Signed)
Asymptomatic - follow along with weaning off Beta Blocker. May need PRN option.

## 2017-03-18 NOTE — Assessment & Plan Note (Signed)
On statin - monitored by PCP. No myalgias or memory issues.

## 2017-03-18 NOTE — Assessment & Plan Note (Signed)
BP better with Midodrine - stopping Beta Blocker.  Hydrate.

## 2017-03-18 NOTE — Assessment & Plan Note (Signed)
Doing better - will fully wean off of Beta Blocker - cut to once daily until bottle complete & then stop.

## 2017-03-18 NOTE — Assessment & Plan Note (Signed)
Last ischemic evaluation was with PCI in 2014 -- based upon age & lack of recurrent Sx, we have decided to forgo follow-up screening Myoview ST.

## 2017-03-18 NOTE — Assessment & Plan Note (Signed)
No active angina since last PCI. Still quite active for his age. Remains on Plavix, Imdur & statin --> will wean off of low dose BB b/c fatigue concern & baseline low BP.

## 2017-03-22 NOTE — Addendum Note (Signed)
Addended by: Kandice Robinsons T on: 03/22/2017 08:23 AM   Modules accepted: Orders

## 2017-03-30 ENCOUNTER — Other Ambulatory Visit: Payer: Self-pay | Admitting: Cardiology

## 2017-03-30 NOTE — Telephone Encounter (Signed)
Rx(s) sent to pharmacy electronically.  

## 2017-04-21 ENCOUNTER — Other Ambulatory Visit: Payer: Self-pay | Admitting: Cardiology

## 2017-04-21 NOTE — Telephone Encounter (Signed)
Rx(s) sent to pharmacy electronically.  

## 2017-05-04 ENCOUNTER — Ambulatory Visit: Payer: Medicare Other | Admitting: Nurse Practitioner

## 2017-06-20 DIAGNOSIS — Z23 Encounter for immunization: Secondary | ICD-10-CM | POA: Diagnosis not present

## 2017-06-20 DIAGNOSIS — N138 Other obstructive and reflux uropathy: Secondary | ICD-10-CM | POA: Diagnosis not present

## 2017-06-20 DIAGNOSIS — N401 Enlarged prostate with lower urinary tract symptoms: Secondary | ICD-10-CM | POA: Diagnosis not present

## 2017-06-20 DIAGNOSIS — I2510 Atherosclerotic heart disease of native coronary artery without angina pectoris: Secondary | ICD-10-CM | POA: Diagnosis not present

## 2017-06-20 DIAGNOSIS — Z79899 Other long term (current) drug therapy: Secondary | ICD-10-CM | POA: Diagnosis not present

## 2017-06-20 DIAGNOSIS — I10 Essential (primary) hypertension: Secondary | ICD-10-CM | POA: Diagnosis not present

## 2017-06-20 DIAGNOSIS — I25810 Atherosclerosis of coronary artery bypass graft(s) without angina pectoris: Secondary | ICD-10-CM | POA: Diagnosis not present

## 2017-06-20 DIAGNOSIS — E785 Hyperlipidemia, unspecified: Secondary | ICD-10-CM | POA: Diagnosis not present

## 2017-06-20 DIAGNOSIS — K219 Gastro-esophageal reflux disease without esophagitis: Secondary | ICD-10-CM | POA: Diagnosis not present

## 2017-06-20 DIAGNOSIS — Z125 Encounter for screening for malignant neoplasm of prostate: Secondary | ICD-10-CM | POA: Diagnosis not present

## 2017-08-03 ENCOUNTER — Ambulatory Visit: Payer: Self-pay | Admitting: Nurse Practitioner

## 2017-09-07 ENCOUNTER — Ambulatory Visit (INDEPENDENT_AMBULATORY_CARE_PROVIDER_SITE_OTHER): Payer: Medicare Other | Admitting: Cardiology

## 2017-09-07 ENCOUNTER — Encounter: Payer: Self-pay | Admitting: Cardiology

## 2017-09-07 VITALS — BP 162/74 | HR 66 | Ht 74.0 in | Wt 202.0 lb

## 2017-09-07 DIAGNOSIS — G459 Transient cerebral ischemic attack, unspecified: Secondary | ICD-10-CM

## 2017-09-07 DIAGNOSIS — E785 Hyperlipidemia, unspecified: Secondary | ICD-10-CM | POA: Diagnosis not present

## 2017-09-07 DIAGNOSIS — I491 Atrial premature depolarization: Secondary | ICD-10-CM

## 2017-09-07 DIAGNOSIS — I10 Essential (primary) hypertension: Secondary | ICD-10-CM

## 2017-09-07 DIAGNOSIS — I25810 Atherosclerosis of coronary artery bypass graft(s) without angina pectoris: Secondary | ICD-10-CM

## 2017-09-07 DIAGNOSIS — Z9861 Coronary angioplasty status: Secondary | ICD-10-CM | POA: Diagnosis not present

## 2017-09-07 DIAGNOSIS — I498 Other specified cardiac arrhythmias: Secondary | ICD-10-CM

## 2017-09-07 DIAGNOSIS — I951 Orthostatic hypotension: Secondary | ICD-10-CM

## 2017-09-07 DIAGNOSIS — I2510 Atherosclerotic heart disease of native coronary artery without angina pectoris: Secondary | ICD-10-CM | POA: Diagnosis not present

## 2017-09-07 NOTE — Progress Notes (Signed)
PCP: Street, Stephanie Coup, MD  - new change  Clinic Note: Chief Complaint  Patient presents with  . Follow-up    No major complaints besides deconditioning  . Coronary Artery Disease    HPI: Joshua Chambers is a 84 y.o. male with a PMH below who presents today for Six-month follow-up for CAD. CAD history: s/p CABG in 2007 with LIMA-LAD, SVG-D1, and SVG-RCA, flush occlusion of SVG-OM and SVG-D1 by cath in 2014, 99% occlusion of SVG-RCA with DES placed to native RCA at that time). He also has hypertension, with orthostatic hypotension. He also has hyperlipidemia and history of DVT.  Joshua Chambers was last seen in Aug 2018 - doing well  Recent Hospitalizations: 03/16/2017 - ? TIA, slurred speeck.  Studies Personally Reviewed - (if available, images/films reviewed: From Epic Chart or Care Everywhere)  Echo 03/16/2017: Poor acoustic windows limit study. EF 50-55% with mild inferior, inferoseptal hypokinesis. The cavity size was normal. Wall thickness was normal. Systolic function was normal. - Aortic valve: There was trivial regurgitation. - Mitral valve: There was mild regurgitation.  Carotid dopplers 02/2017: Bilateral 1-39% ICA stenosis. Antegrade vertebral flow.  Interval History: [name] today doing well.  He denies any recurrent episodes like he had back in August.  He does not think he had a stroke or TIA he just thinks he was a little tired.  He still walking with a cane because of balance issues, and is not really doing the same amount of walking that he used to do probably has a little deconditioning. -He still gets a little bit short of breath if he tries to go to fast, but really does not do that much because of his balance issues.  He may occasionally have some orthostatic dizziness, but that is stable.  We have backed off on his blood pressure medications and has helped a lot. He denies any rapid irregular heartbeats or palpitations.  No anginal symptoms with rest or  exertion.  No PND, orthopnea or edema.  No claudication.  ROS: A comprehensive was performed. Pertinent symptoms noted in HPI. Review of Systems  Constitutional: Negative for malaise/fatigue (Mostly because he is not doing much now).  Respiratory: Negative for cough and wheezing.   Gastrointestinal: Negative for abdominal pain, blood in stool, constipation, heartburn and melena.  Genitourinary: Positive for frequency (nocturia).  Musculoskeletal: Positive for back pain and joint pain.  Neurological: Negative for tingling (numbness in both hands (L > R), also in feet).       Poor balance - unsteady gait  Psychiatric/Behavioral: Negative for memory loss.  All other systems reviewed and are negative.  I have reviewed and (if needed) personally updated the patient's problem list, medications, allergies, past medical and surgical history, social and family history.   Past Medical History:  Diagnosis Date  . Arthritis   . CAD (coronary artery disease), autologous vein bypass graft 04/19/2013   100% flush occlusion of SVG-OM & SVG-Diag; near Subtotal Occlusion of SVG-RCA (PCI of RCA done to avoid distal embolization with PCI to SVG)  . CAD (coronary artery disease), native coronary artery 2007   Multivessel CAD referred for CABG  . CAD S/P percutaneous coronary angioplasty  04/19/2013   PCI of Native RCA - Promus Premier DES 3.0 mm x 38 mm (3.6 mm);   [name] Dyslipidemia, goal LDL below 70   . History of DVT of lower extremity    Right popliteal vein  . History of stroke August 2011  Possible TIA in 2001;  Echo: EF 45-50%, mild HK of anterior septum.  [name] Hx of orthostatic hypotension 2009   Positive tilt table:  dizziness/vasopressor type; previously on the decision   . Hx SBO   . Nephrolithiasis   . S/P CABG x 3 2007   LIMA-LAD, SVG-DIAG, SVG-RCA  . Unsteady gait     Past Surgical History:  Procedure Laterality Date  . APPENDECTOMY    . COLON SURGERY    . CORONARY ANGIOPLASTY WITH  STENT PLACEMENT  04/19/13   Stent-Promus DES (3.0 mm x 38 mm - 3.6 mm) to native RCA prox-mid,  . CORONARY ARTERY BYPASS GRAFT  2007   LIMA-LAD, SVG-D1, SVG-RCA  . LEFT HEART CATHETERIZATION WITH CORONARY/GRAFT ANGIOGRAM Left 04/19/2013   LEFT HEART CATH W/ CORONARY/GRAFT ANGIOGRAM;  [name], MD;  Location: [facility] CATH LAB;  LM 20%-->LAD 70-80% then subTO after D1 (Patent LIMA-LAD); Small non-dom Cx ~20% ostial.  RCA long 60-70% prox --> 90% mid.  SVG-OM & SVG-Diag 100%, diffuse Subtotal occlusion of SVG-rPD  . TONSILLECTOMY      Current Meds  Medication Sig  . aspirin 325 MG tablet Take 1 tablet (325 mg total) by mouth daily.  [name] atorvastatin (LIPITOR) 40 MG tablet TAKE 1 TABLET BY MOUTH DAILY AT 6:00 PM  . clopidogrel (PLAVIX) 75 MG tablet TAKE 1 TABLET BY MOUTH EVERY DAY  . dutasteride (AVODART) 0.5 MG capsule Take 0.5 mg by mouth daily.   . isosorbide mononitrate (IMDUR) 30 MG 24 hr tablet TAKE 1/2 TABLET BY MOUTH EVERY DAY  . metoprolol tartrate (LOPRESSOR) 25 MG tablet Take 12.5 mg by mouth daily.  . midodrine (PROAMATINE) 10 MG tablet TAKE 1 TABLET(10 MG) BY MOUTH THREE TIMES DAILY WITH MEALS  . nitroGLYCERIN (NITROSTAT) 0.4 MG SL tablet Place 1 tablet (0.4 mg total) under the tongue every 5 (five) minutes as needed for chest pain.  . Polyvinyl Alcohol-Povidone (REFRESH OP) Place 1 drop into both eyes daily as needed.  [name] PULMICORT FLEXHALER 180 MCG/ACT inhaler Inhale 2 puffs into the lungs daily.   . ranitidine (ZANTAC) 150 MG tablet Take 150 mg by mouth 2 (two) times daily as needed for heartburn.     Allergies  Allergen Reactions  . Amiodarone Other (See Comments)    Extreme dehydration  . Diltiazem Other (See Comments)    Pt unsure of reaction  . Sulfa Antibiotics Other (See Comments)    Pt unsure of reaction    Social History   Socioeconomic History  . Marital status: Married    Spouse name: None  . Number of children: None  . Years of education: None  . Highest  education level: None  Social Needs  . Financial resource strain: None  . Food insecurity - worry: None  . Food insecurity - inability: None  . Transportation needs - medical: None  . Transportation needs - non-medical: None  Occupational History  . None  Tobacco Use  . Smoking status: Never Smoker  . Smokeless tobacco: Never Used  Substance and Sexual Activity  . Alcohol use: No  . Drug use: No  . Sexual activity: None  Other Topics Concern  . None  Social History Narrative    He is now remarried for the last 2 years, his first wife of 60 years is deceased. He has 1   child from that first marriage and 2 grandchildren.    He does not smoke, does not drink.    He is not  very active but he does kind of meander around with his new wife who is permanently disabled.     family history includes Hypertension in his son.  Wt Readings from Last 3 Encounters:  09/07/17 202 lb (91.6 kg)  03/16/17 196 lb 3.2 oz (89 kg)  03/15/17 199 lb (90.3 kg)    PHYSICAL EXAM BP (!) 162/74   Pulse 66   Ht 6\' 2"  (1.88 m)   Wt 202 lb (91.6 kg)   SpO2 100%   BMI 25.94 kg/m  Physical Exam  Constitutional: He is oriented to person, place, and time. He appears well-developed and well-nourished.  Appears younger than stated age  HENT:  Head: Normocephalic and atraumatic.  Mouth/Throat: Oropharynx is clear and moist.  Neck: No hepatojugular reflux and no JVD present. Carotid bruit is not present.  Cardiovascular: Normal rate, regular rhythm, normal heart sounds and intact distal pulses. Exam reveals no gallop and no friction rub.  No murmur heard. Pulmonary/Chest: Effort normal and breath sounds normal. No respiratory distress. He has no wheezes.  Abdominal: Soft. Bowel sounds are normal. He exhibits no distension. There is no tenderness. There is no rebound.  Musculoskeletal: Normal range of motion. He exhibits no edema.  Neurological: He is alert and oriented to person, place, and time.    Psychiatric: He has a normal mood and affect. His behavior is normal. Judgment and thought content normal.  Nursing note and vitals reviewed.    Adult ECG Report NSR with PVCs vs. PAC - rate 66   Other studies Reviewed: Additional studies/ records that were reviewed today include:  Recent Labs:  Not available   ASSESSMENT / PLAN: Problem List Items Addressed This Visit    CAD S/P percutaneous coronary angioplasty - PCI of native RCA; Promus DES 3.0 mm x38 mm (3.6 mm) (Chronic)    He has had no further anginal symptoms since his last PCI.  He is still quite active, but not nearly as much as he used to be.  No longer ARB because of orthostatic hypotension, and only tolerating low-dose beta-blocker once daily.  He does take Imdur 1/2 tablet. He is on Plavix and statin.      Coronary artery disease involving coronary bypass graft of native heart without angina pectoris; CAD (coronary artery disease) of artery bypass graft:  loss of VG to RCA and VG-Diag - Primary (Chronic)   Dyslipidemia, goal LDL below 70 (Chronic)    On statin.  Tolerating with no myalgias.  Unfortunately no have any recent labs, but they were recently checked by his new PCP.      Essential hypertension (Chronic)    Allowing permissive hypertension.  Current blood pressure is okay to avoid with static hypotension since he is on TID midodrine.      Orthostatic hypotension (Chronic)    Much better since backing off beta-blocker and titrating up midodrine.  Maintain adequate hydration.      Premature atrial contractions (Chronic)    Quite frequent PACs and PVCs noted today as well as before.  On exam it almost sounds like A. fib.  But not he is asymptomatic.  Therefore I would not adjust any further.  If he has palpitations can take an additional dose of metoprolol.      Relevant Orders   EKG 12-Lead (Completed)   Sinus arrhythmia   Relevant Orders   EKG 12-Lead (Completed)   TIA (transient ischemic attack)     No recurrent symptoms.  Remain on Plavix  Current medicines are reviewed at length with the patient today. (+/- concerns) n/a The following changes have been made: n/a  Patient Instructions  NO CHANGES WITH MEDICATIONS     Your physician wants you to follow-up in 6 MONTHS WITH DR Genia Perin. You will receive a reminder letter in the mail two months in advance. If you don't receive a letter, please call our office to schedule the follow-up appointment.     If you need a refill on your cardiac medications before your next appointment, please call your pharmacy.    Studies Ordered:   Orders Placed This Encounter  Procedures  . EKG 12-Lead      Bryan Lemma, M.D., M.S. Interventional Cardiologist   Pager # [phone] Phone # [phone] [address] [place], [place] [address]

## 2017-09-07 NOTE — Patient Instructions (Signed)
NO CHANGES WITH MEDICATIONS    Your physician wants you to follow-up in 6 MONTHS WITH DR HARDING. You will receive a reminder letter in the mail two months in advance. If you don't receive a letter, please call our office to schedule the follow-up appointment.    If you need a refill on your cardiac medications before your next appointment, please call your pharmacy.  

## 2017-09-09 NOTE — Assessment & Plan Note (Signed)
He has had no further anginal symptoms since his last PCI.  He is still quite active, but not nearly as much as he used to be.  No longer ARB because of orthostatic hypotension, and only tolerating low-dose beta-blocker once daily.  He does take Imdur 1/2 tablet. He is on Plavix and statin.

## 2017-09-09 NOTE — Assessment & Plan Note (Signed)
Quite frequent PACs and PVCs noted today as well as before.  On exam it almost sounds like A. fib.  But not he is asymptomatic.  Therefore I would not adjust any further.  If he has palpitations can take an additional dose of metoprolol.

## 2017-09-09 NOTE — Assessment & Plan Note (Signed)
Much better since backing off beta-blocker and titrating up midodrine.  Maintain adequate hydration.

## 2017-09-09 NOTE — Assessment & Plan Note (Signed)
Allowing permissive hypertension.  Current blood pressure is okay to avoid with static hypotension since he is on TID midodrine.

## 2017-09-09 NOTE — Assessment & Plan Note (Signed)
On statin.  Tolerating with no myalgias.  Unfortunately no have any recent labs, but they were recently checked by his new PCP.

## 2017-09-09 NOTE — Assessment & Plan Note (Signed)
No recurrent symptoms.  Remain on Plavix

## 2017-09-11 DIAGNOSIS — N401 Enlarged prostate with lower urinary tract symptoms: Secondary | ICD-10-CM | POA: Diagnosis not present

## 2017-09-11 DIAGNOSIS — R351 Nocturia: Secondary | ICD-10-CM | POA: Diagnosis not present

## 2017-09-11 DIAGNOSIS — N281 Cyst of kidney, acquired: Secondary | ICD-10-CM | POA: Diagnosis not present

## 2017-09-11 DIAGNOSIS — R3121 Asymptomatic microscopic hematuria: Secondary | ICD-10-CM | POA: Diagnosis not present

## 2017-09-19 DIAGNOSIS — Z6826 Body mass index (BMI) 26.0-26.9, adult: Secondary | ICD-10-CM | POA: Diagnosis not present

## 2017-09-19 DIAGNOSIS — R1310 Dysphagia, unspecified: Secondary | ICD-10-CM | POA: Diagnosis not present

## 2017-09-19 DIAGNOSIS — K219 Gastro-esophageal reflux disease without esophagitis: Secondary | ICD-10-CM | POA: Diagnosis not present

## 2017-09-25 ENCOUNTER — Telehealth: Payer: Self-pay | Admitting: *Deleted

## 2017-09-25 DIAGNOSIS — R1310 Dysphagia, unspecified: Secondary | ICD-10-CM | POA: Diagnosis not present

## 2017-09-25 NOTE — Telephone Encounter (Signed)
   Primary Cardiologist: Dr. Herbie Baltimore   Chart reviewed as part of pre-operative protocol coverage. Patient was contacted 09/25/2017 in reference to pre-operative risk assessment for pending surgery as outlined below.  Joshua Chambers was last seen on 09/07/17 by Dr. Herbie Baltimore.  Since that day, Joshua Chambers has done well w/o anginal symptoms. No exertional CP or dyspnea.   Therefore, based on ACC/AHA guidelines, the patient would be at acceptable risk for the planned procedure without further cardiovascular testing. However, I will route info to Dr. Herbie Baltimore to see if ok to hold Plavix for procedure.    Robbie Lis, PA-C 09/25/2017, 3:18 PM

## 2017-09-25 NOTE — Telephone Encounter (Signed)
PRIMARY CARDIOLOGIST  DR HARDING.       [facility] Pre-operative Risk Assessment    Request for surgical clearance:  1. What type of surgery is being performed?  EGD DILATAION  2. When is this surgery scheduled? MARCH 21,2019  3. What type of clearance is required (medical clearance vs. Pharmacy clearance to hold med vs. Both)? MEDICAL  4. Are there any medications that need to be held prior to surgery and how long?PLAVIX  5. Practice name and name of physician performing surgery? DR Kyra Leyland   , [facility]   6. What is your office phone and fax number? PHONE [phone] ,,[phone]   7. Anesthesia type (None, local, MAC, general) ? UNKNOWN   Joshua Chambers 09/25/2017, 2:31 PM  _________________________________________________________________   (provider comments below)

## 2017-09-27 NOTE — Telephone Encounter (Signed)
Peripherally okay to hold Plavix 5-7 days pre-procedure & 2-3 days post.  Bryan Lemma, MD

## 2017-09-27 NOTE — Telephone Encounter (Signed)
Will route to requesting provider.

## 2017-10-10 DIAGNOSIS — Z8673 Personal history of transient ischemic attack (TIA), and cerebral infarction without residual deficits: Secondary | ICD-10-CM | POA: Diagnosis not present

## 2017-10-10 DIAGNOSIS — K222 Esophageal obstruction: Secondary | ICD-10-CM | POA: Diagnosis not present

## 2017-10-10 DIAGNOSIS — Z800 Family history of malignant neoplasm of digestive organs: Secondary | ICD-10-CM | POA: Diagnosis not present

## 2017-10-10 DIAGNOSIS — I25810 Atherosclerosis of coronary artery bypass graft(s) without angina pectoris: Secondary | ICD-10-CM | POA: Diagnosis not present

## 2017-10-10 DIAGNOSIS — K449 Diaphragmatic hernia without obstruction or gangrene: Secondary | ICD-10-CM | POA: Diagnosis not present

## 2017-10-10 DIAGNOSIS — Z955 Presence of coronary angioplasty implant and graft: Secondary | ICD-10-CM | POA: Diagnosis not present

## 2017-10-10 DIAGNOSIS — I10 Essential (primary) hypertension: Secondary | ICD-10-CM | POA: Diagnosis not present

## 2017-10-10 DIAGNOSIS — Z9049 Acquired absence of other specified parts of digestive tract: Secondary | ICD-10-CM | POA: Diagnosis not present

## 2017-10-10 DIAGNOSIS — Z79899 Other long term (current) drug therapy: Secondary | ICD-10-CM | POA: Diagnosis not present

## 2017-10-10 DIAGNOSIS — R1310 Dysphagia, unspecified: Secondary | ICD-10-CM | POA: Diagnosis not present

## 2017-11-13 DIAGNOSIS — K222 Esophageal obstruction: Secondary | ICD-10-CM | POA: Diagnosis not present

## 2017-11-13 DIAGNOSIS — R1310 Dysphagia, unspecified: Secondary | ICD-10-CM | POA: Diagnosis not present

## 2017-12-24 ENCOUNTER — Other Ambulatory Visit: Payer: Self-pay | Admitting: Cardiology

## 2018-01-03 DIAGNOSIS — E785 Hyperlipidemia, unspecified: Secondary | ICD-10-CM

## 2018-01-03 DIAGNOSIS — K219 Gastro-esophageal reflux disease without esophagitis: Secondary | ICD-10-CM | POA: Diagnosis not present

## 2018-01-03 DIAGNOSIS — I2510 Atherosclerotic heart disease of native coronary artery without angina pectoris: Secondary | ICD-10-CM | POA: Diagnosis not present

## 2018-01-03 DIAGNOSIS — R0902 Hypoxemia: Secondary | ICD-10-CM | POA: Diagnosis not present

## 2018-01-03 DIAGNOSIS — Z79899 Other long term (current) drug therapy: Secondary | ICD-10-CM | POA: Diagnosis not present

## 2018-01-03 DIAGNOSIS — F17210 Nicotine dependence, cigarettes, uncomplicated: Secondary | ICD-10-CM | POA: Diagnosis not present

## 2018-01-03 DIAGNOSIS — I4891 Unspecified atrial fibrillation: Secondary | ICD-10-CM | POA: Diagnosis not present

## 2018-01-03 DIAGNOSIS — R4781 Slurred speech: Secondary | ICD-10-CM | POA: Diagnosis not present

## 2018-01-03 DIAGNOSIS — R29810 Facial weakness: Secondary | ICD-10-CM | POA: Diagnosis not present

## 2018-01-03 DIAGNOSIS — Z7982 Long term (current) use of aspirin: Secondary | ICD-10-CM | POA: Diagnosis not present

## 2018-01-03 DIAGNOSIS — I10 Essential (primary) hypertension: Secondary | ICD-10-CM | POA: Diagnosis not present

## 2018-01-03 DIAGNOSIS — I639 Cerebral infarction, unspecified: Secondary | ICD-10-CM | POA: Diagnosis not present

## 2018-01-03 DIAGNOSIS — R531 Weakness: Secondary | ICD-10-CM | POA: Diagnosis not present

## 2018-01-03 DIAGNOSIS — I6523 Occlusion and stenosis of bilateral carotid arteries: Secondary | ICD-10-CM | POA: Diagnosis not present

## 2018-01-03 DIAGNOSIS — I749 Embolism and thrombosis of unspecified artery: Secondary | ICD-10-CM | POA: Diagnosis not present

## 2018-01-03 DIAGNOSIS — G459 Transient cerebral ischemic attack, unspecified: Secondary | ICD-10-CM | POA: Diagnosis not present

## 2018-01-04 DIAGNOSIS — I10 Essential (primary) hypertension: Secondary | ICD-10-CM | POA: Diagnosis not present

## 2018-01-04 DIAGNOSIS — I4891 Unspecified atrial fibrillation: Secondary | ICD-10-CM | POA: Diagnosis not present

## 2018-01-04 DIAGNOSIS — I2510 Atherosclerotic heart disease of native coronary artery without angina pectoris: Secondary | ICD-10-CM | POA: Diagnosis not present

## 2018-01-04 DIAGNOSIS — K219 Gastro-esophageal reflux disease without esophagitis: Secondary | ICD-10-CM | POA: Diagnosis not present

## 2018-01-04 DIAGNOSIS — I6523 Occlusion and stenosis of bilateral carotid arteries: Secondary | ICD-10-CM | POA: Diagnosis not present

## 2018-01-04 DIAGNOSIS — I6789 Other cerebrovascular disease: Secondary | ICD-10-CM | POA: Diagnosis not present

## 2018-01-04 DIAGNOSIS — G459 Transient cerebral ischemic attack, unspecified: Secondary | ICD-10-CM | POA: Diagnosis not present

## 2018-01-04 DIAGNOSIS — E785 Hyperlipidemia, unspecified: Secondary | ICD-10-CM | POA: Diagnosis not present

## 2018-01-09 DIAGNOSIS — Z8673 Personal history of transient ischemic attack (TIA), and cerebral infarction without residual deficits: Secondary | ICD-10-CM | POA: Diagnosis not present

## 2018-01-09 DIAGNOSIS — I5042 Chronic combined systolic (congestive) and diastolic (congestive) heart failure: Secondary | ICD-10-CM | POA: Diagnosis not present

## 2018-01-09 DIAGNOSIS — I6523 Occlusion and stenosis of bilateral carotid arteries: Secondary | ICD-10-CM | POA: Diagnosis not present

## 2018-01-09 DIAGNOSIS — I25810 Atherosclerosis of coronary artery bypass graft(s) without angina pectoris: Secondary | ICD-10-CM | POA: Diagnosis not present

## 2018-01-10 ENCOUNTER — Encounter: Payer: Self-pay | Admitting: Neurology

## 2018-01-11 ENCOUNTER — Other Ambulatory Visit: Payer: Self-pay

## 2018-01-11 DIAGNOSIS — I6523 Occlusion and stenosis of bilateral carotid arteries: Secondary | ICD-10-CM

## 2018-01-23 ENCOUNTER — Encounter: Payer: Self-pay | Admitting: Cardiology

## 2018-02-05 NOTE — Progress Notes (Signed)
Cardiology Office Note:    Date:  02/06/2018   ID:  Joshua, Chambers 1923/02/23, MRN [id number]  PCP:  Street, Joshua Coup, MD  Cardiologist:  Joshua Herrlich, MD    Referring MD: [address] Joshua Chambers, *    ASSESSMENT:    1. PAF (paroxysmal atrial fibrillation) (HCC)   2. Ischemic cardiomyopathy   3. CAD S/P percutaneous coronary angioplasty - PCI of native RCA; Promus DES 3.0 mm x38 mm (3.6 mm)   4. Coronary artery disease involving coronary bypass graft of native heart without angina pectoris; CAD (coronary artery disease) of artery bypass graft:  loss of VG to RCA and VG-Diag   5. Essential hypertension   6. Dyslipidemia, goal LDL below 70    PLAN:    In order of problems listed above:  1. Indeed after reviewed the hospital records he was in atrial fibrillation on presentation in the emergency room as well as on a monitor strip from March it appears he had an endoscopic procedure.  On my office contact him stop aspirin and clopidogrel and initiated on Eliquis he is at high risk of stroke with recent TIA. 2. Stable is at variable degrees of left ventricular dysfunction EF greater than 40% does not have heart failure and continue current medical treatment 3. Stable continue medical treatment I do not see an indication for coronary angiography at this time 4. See above 5. Stable continue current treatment 6. Stable continue statin with CAD recent labs renal function potassium liver function lipid profile requested from his PCP   Next appointment: 6 weeks after initiation of anticoagulant therapy   Medication Adjustments/Labs and Tests Ordered: Current medicines are reviewed at length with the patient today.  Concerns regarding medicines are outlined above.  Orders Placed This Encounter  Procedures  . Cardiac event monitor  . EKG 12-Lead   Meds ordered this encounter  Medications  . apixaban (ELIQUIS) 5 MG TABS tablet    Sig: Take 1 tablet (5 mg total) by mouth 2 (two)  times daily.    Dispense:  60 tablet    Refill:  3    Chief Complaint  Patient presents with  . New Patient (Initial Visit)    EF reported 40-45%  . Coronary Artery Disease    History of Present Illness:    Joshua Chambers is a 84 y.o. male with a hx of CAD CABG in 2007,  hypertension, orthostatic hypotension, APC's, and TIA  last seen by Dr Joshua Chambers 09/07/17. CAD history: s/p CABG in 2007 with LIMA-LAD, SVG-D1, and SVG-RCA, flush occlusion of SVG-OM and SVG-D1 by cath in 2014, 99% occlusion of SVG-RCA with DES placed to native RCA at that time). He also has hypertension, with orthostatic hypotension. He also has hyperlipidemia and history of DVT.  Echo 03/17/17 with EF 50-55%.  Echo EF 45-50% in 2011. MUGA EF 49% in 2014. He was at [facility]/24/2017 with a TIA echo is reported as an EF of 40 to 45% with mild to moderate MR.  Is questioning those records have been requested.  I am unable to reconcile his records patient tells me he has had atrial fibrillation reviewed his chart and I cannot find an EKG showing it her record documenting.  The [facility] emergency room records says he is in chronic atrial fibrillation but he was not anticoagulated with a TIA I have actually requested for those EKGs to be seen in my office if he has documented atrial fibrillation clopidogrel should  be stopped he should be anticoagulated.  I requested those records and awaiting them to arrive today including all EKGs and monitor strips  Compliance with diet, lifestyle and medications: Yes  EKGs monitor strips received he was in atrial fibrillation controlled rate on admission to the hospital as well as on a monitor strip from March when he had endoscopic procedures.  Is a very high risk of stroke with recent TIA office will contact him to stop dual antiplatelet therapy and initiate anticoagulant therapy with Eliquis.  He has no contraindication.  Past Medical History:  Diagnosis Date  .  Arteriosclerosis of arterial coronary artery bypass graft   . Arthritis   . Atrial contractions, premature   . Benign hypertension   . Bilateral carotid artery stenosis   . CAD (coronary artery disease), autologous vein bypass graft 04/19/2013   100% flush occlusion of SVG-OM & SVG-Diag; near Subtotal Occlusion of SVG-RCA (PCI of RCA done to avoid distal embolization with PCI to SVG)  . CAD (coronary artery disease), native coronary artery 2007   Multivessel CAD referred for CABG  . CAD S/P percutaneous coronary angioplasty  04/19/2013   PCI of Native RCA - Promus Premier DES 3.0 mm x 38 mm (3.6 mm);   [name] Dyslipidemia   . Dyslipidemia, goal LDL below 70   . GERD (gastroesophageal reflux disease)   . History of DVT of lower extremity    Right popliteal vein  . History of stroke August 2011   Possible TIA in 2001;  Echo: EF 45-50%, mild HK of anterior septum.  [name] History of transient cerebral ischemia   . Hx of orthostatic hypotension 2009   Positive tilt table:  dizziness/vasopressor type; previously on the decision   . Hx SBO   . Nephrolithiasis   . Orthostatic hypotension   . S/P CABG x 3 2007   LIMA-LAD, SVG-DIAG, SVG-RCA  . Unsteady gait     Past Surgical History:  Procedure Laterality Date  . APPENDECTOMY    . CARDIAC SURGERY  2000s  . COLON SURGERY    . CORONARY ANGIOPLASTY WITH STENT PLACEMENT  04/19/13   Stent-Promus DES (3.0 mm x 38 mm - 3.6 mm) to native RCA prox-mid,  . CORONARY ARTERY BYPASS GRAFT  2007   LIMA-LAD, SVG-D1, SVG-RCA  . LEFT HEART CATHETERIZATION WITH CORONARY/GRAFT ANGIOGRAM Left 04/19/2013   LEFT HEART CATH W/ CORONARY/GRAFT ANGIOGRAM;  Joshua Lex, MD;  Location: [facility] CATH LAB;  LM 20%-->LAD 70-80% then subTO after D1 (Patent LIMA-LAD); Small non-dom Cx ~20% ostial.  RCA long 60-70% prox --> 90% mid.  SVG-OM & SVG-Diag 100%, diffuse Subtotal occlusion of SVG-rPD  . STENT PLACEMENT VASCULAR ([facility] HX)    . TONSILLECTOMY      Current  Medications: Current Meds  Medication Sig  . acetaminophen (TYLENOL) 500 MG tablet Take 1,000 mg by mouth at bedtime.  [name] atorvastatin (LIPITOR) 40 MG tablet TAKE 1 TABLET BY MOUTH DAILY AT 6:00 PM (Patient taking differently: TAKE 0.5 TABLET BY MOUTH DAILY AT 6:00 PM)  . dutasteride (AVODART) 0.5 MG capsule Take 0.5 mg by mouth daily.   . metoprolol tartrate (LOPRESSOR) 25 MG tablet Take 12.5 mg by mouth daily.  . midodrine (PROAMATINE) 10 MG tablet TAKE 1 TABLET(10 MG) BY MOUTH THREE TIMES DAILY WITH MEALS  . nitroGLYCERIN (NITROSTAT) 0.4 MG SL tablet Place 1 tablet (0.4 mg total) under the tongue every 5 (five) minutes as needed for chest pain.  . Polyvinyl Alcohol-Povidone (REFRESH OP) Place  1 drop into both eyes daily as needed.  [name] PULMICORT FLEXHALER 180 MCG/ACT inhaler Inhale 2 puffs into the lungs daily.   . ranitidine (ZANTAC) 150 MG tablet Take 150 mg by mouth 2 (two) times daily as needed for heartburn.   . [DISCONTINUED] aspirin 325 MG tablet Take 1 tablet (325 mg total) by mouth daily.  . [DISCONTINUED] clopidogrel (PLAVIX) 75 MG tablet TAKE 1 TABLET BY MOUTH EVERY DAY     Allergies:   Amiodarone; Diltiazem; Statins; and Sulfa antibiotics   Social History   Socioeconomic History  . Marital status: Married    Spouse name: Not on file  . Number of children: Not on file  . Years of education: Not on file  . Highest education level: Not on file  Occupational History  . Not on file  Social Needs  . Financial resource strain: Not on file  . Food insecurity:    Worry: Not on file    Inability: Not on file  . Transportation needs:    Medical: Not on file    Non-medical: Not on file  Tobacco Use  . Smoking status: Never Smoker  . Smokeless tobacco: Never Used  Substance and Sexual Activity  . Alcohol use: No  . Drug use: No  . Sexual activity: Not on file  Lifestyle  . Physical activity:    Days per week: Not on file    Minutes per session: Not on file  . Stress: Not  on file  Relationships  . Social connections:    Talks on phone: Not on file    Gets together: Not on file    Attends religious service: Not on file    Active member of club or organization: Not on file    Attends meetings of clubs or organizations: Not on file    Relationship status: Not on file  Other Topics Concern  . Not on file  Social History Narrative    He is now remarried for the last 2 years, his first wife of 60 years is deceased. He has 1   child from that first marriage and 2 grandchildren.    He does not smoke, does not drink.    He is not very active but he does kind of meander around with his new wife who is permanently disabled.      Family History: The patient's family history includes Colon cancer in his father; Hypertension in his son. ROS:   Please see the history of present illness.    All other systems reviewed and are negative.  EKGs/Labs/Other Studies Reviewed:    The following studies were reviewed today:  EKG:  EKG ordered today.  The ekg ordered today demonstrates sinus rhythm  E[facility] at [facility]  ED 01/03/18 and monitor strip 10/10/17 show atrial fibrillation  Recent Labs: 03/17/2017: ALT 14; BUN 15; Creatinine, Ser 0.80; Hemoglobin 11.8; Magnesium 2.1; Platelets 263; Potassium 3.8; Sodium 141  Recent Lipid Panel    Component Value Date/Time   CHOL 172 03/17/2017 0006   TRIG 103 03/17/2017 0006   HDL 47 03/17/2017 0006   CHOLHDL 3.7 03/17/2017 0006   VLDL 21 03/17/2017 0006   LDLCALC 104 (H) 03/17/2017 0006    Physical Exam:    VS:  BP 118/70 (BP Location: Right Arm, Patient Position: Sitting, Cuff Size: Normal)   Pulse 65   Ht 6\' 2"  (1.88 m)   Wt 198 lb (89.8 kg)   SpO2 98%   BMI 25.42 kg/m  Wt Readings from Last 3 Encounters:  02/06/18 198 lb (89.8 kg)  09/07/17 202 lb (91.6 kg)  03/16/17 196 lb 3.2 oz (89 kg)     GEN:  Well nourished, well developed in no acute distress HEENT: Normal NECK: No JVD; No carotid bruits LYMPHATICS:  No lymphadenopathy CARDIAC: RRR, no murmurs, rubs, gallops RESPIRATORY:  Clear to auscultation without rales, wheezing or rhonchi  ABDOMEN: Soft, non-tender, non-distended MUSCULOSKELETAL:  No edema; No deformity  SKIN: Warm and dry NEUROLOGIC:  Alert and oriented x 3 PSYCHIATRIC:  Normal affect    Signed, Joshua Herrlich, MD  02/06/2018 8:14 PM    [facility]

## 2018-02-06 ENCOUNTER — Telehealth: Payer: Self-pay

## 2018-02-06 ENCOUNTER — Encounter: Payer: Self-pay | Admitting: Cardiology

## 2018-02-06 ENCOUNTER — Ambulatory Visit (INDEPENDENT_AMBULATORY_CARE_PROVIDER_SITE_OTHER): Payer: Medicare Other | Admitting: Cardiology

## 2018-02-06 VITALS — BP 118/70 | HR 65 | Ht 74.0 in | Wt 198.0 lb

## 2018-02-06 DIAGNOSIS — I25810 Atherosclerosis of coronary artery bypass graft(s) without angina pectoris: Secondary | ICD-10-CM

## 2018-02-06 DIAGNOSIS — E785 Hyperlipidemia, unspecified: Secondary | ICD-10-CM | POA: Diagnosis not present

## 2018-02-06 DIAGNOSIS — I255 Ischemic cardiomyopathy: Secondary | ICD-10-CM

## 2018-02-06 DIAGNOSIS — I2510 Atherosclerotic heart disease of native coronary artery without angina pectoris: Secondary | ICD-10-CM

## 2018-02-06 DIAGNOSIS — I480 Paroxysmal atrial fibrillation: Secondary | ICD-10-CM

## 2018-02-06 DIAGNOSIS — I10 Essential (primary) hypertension: Secondary | ICD-10-CM

## 2018-02-06 DIAGNOSIS — Z9861 Coronary angioplasty status: Secondary | ICD-10-CM | POA: Diagnosis not present

## 2018-02-06 HISTORY — DX: Paroxysmal atrial fibrillation: I48.0

## 2018-02-06 HISTORY — DX: Ischemic cardiomyopathy: I25.5

## 2018-02-06 MED ORDER — APIXABAN 5 MG PO TABS: 5.0000 mg | ORAL_TABLET | Freq: Two times a day (BID) | ORAL | 3 refills | Status: DC

## 2018-02-06 NOTE — Telephone Encounter (Signed)
S/w pts wife and have educated about medication changes regarding anticoagulation. Wife voices understanding.

## 2018-02-06 NOTE — Patient Instructions (Addendum)
Medication Instructions:  Your physician has recommended you make the following change in your medication:  1.) STOP Plavix and Aspirin. 2.) START Eliquis 5 mg twice daily.   Labwork: None   Testing/Procedures: Your physician has recommended that you wear an event monitor. Event monitors are medical devices that record the heart's electrical activity. Doctors most often [place] these monitors to diagnose arrhythmias. Arrhythmias are problems with the speed or rhythm of the heartbeat. The monitor is a small, portable device. You can wear one while you do your normal daily activities. This is usually used to diagnose what is causing palpitations/syncope (passing out).   Follow-Up: Your physician recommends that you schedule a follow-up appointment in: 6 weeks  Any Other Special Instructions Will Be Listed Below (If Applicable).     If you need a refill on your cardiac medications before your next appointment, please call your pharmacy.

## 2018-02-06 NOTE — Telephone Encounter (Signed)
-----   Message from Baldo Daub, MD sent at 02/06/2018 10:59 AM EDT ----- The ED EKG at [facility] showed atrial fibrillation, needs to stop clopidorel and aspririn and start eliquis 5 mg BID

## 2018-02-06 NOTE — Telephone Encounter (Signed)
-----   Message from Brian J Munley, MD sent at 02/06/2018 10:59 AM EDT ----- The ED EKG at [facility] showed atrial fibrillation, needs to stop clopidorel and aspririn and start eliquis 5 mg BID 

## 2018-02-07 ENCOUNTER — Ambulatory Visit (HOSPITAL_COMMUNITY)
Admission: RE | Admit: 2018-02-07 | Discharge: 2018-02-07 | Disposition: A | Discharge status: Home / Self Care | Payer: Medicare Other | Source: Ambulatory Visit | Attending: Vascular Surgery | Admitting: Vascular Surgery

## 2018-02-07 ENCOUNTER — Encounter: Payer: Self-pay | Admitting: Vascular Surgery

## 2018-02-07 ENCOUNTER — Ambulatory Visit (INDEPENDENT_AMBULATORY_CARE_PROVIDER_SITE_OTHER): Payer: Medicare Other | Admitting: Vascular Surgery

## 2018-02-07 VITALS — BP 129/90 | HR 78 | Resp 18 | Ht 74.0 in | Wt 195.7 lb

## 2018-02-07 DIAGNOSIS — I255 Ischemic cardiomyopathy: Secondary | ICD-10-CM | POA: Diagnosis not present

## 2018-02-07 DIAGNOSIS — I6522 Occlusion and stenosis of left carotid artery: Secondary | ICD-10-CM | POA: Diagnosis not present

## 2018-02-07 DIAGNOSIS — I6523 Occlusion and stenosis of bilateral carotid arteries: Secondary | ICD-10-CM | POA: Diagnosis not present

## 2018-02-07 NOTE — Progress Notes (Signed)
Patient name: Joshua Chambers MRN: [id number] DOB: April 10, 1923 Sex: male   REASON FOR CONSULT:    Bilateral carotid disease.  The consult is requested by Dr. Casper Harrison who is referred  HPI:   Joshua Chambers is a pleasant 84 y.o. male, after a CT angiogram showed evidence of bilateral carotid disease.  I have reviewed the records from the referring office.  The patient was seen in the emergency department at [facility] on 01/03/2018.  The patient had developed left-sided arm and left-sided facial numbness.  Head CT at that time was negative.  Twelve-lead EKG showed atrial fibrillation with a well-controlled rate.  Neurology was involved and recommended an MRI and EEG for the following day.  The patient apparently refuses study because of an inability to lie flat due to neck pain.  On my history, the patient developed sudden onset of paresthesias in the left arm and some facial drooping on the left which lasted about 2 minutes.  The symptoms completely resolved.  However while in the hospital he had a CT scan which was negative.  Reportedly had a CT of the neck which showed some mild left carotid disease with no significant disease on the right.  He has had previous episodes of transient weakness or numbness on the left side.  He denies any history of expressive or receptive aphasia or amaurosis fugax.  He has a history of atrial fibrillation.  He was on aspirin and Plavix but was recently placed on Eliquis and his aspirin and Plavix was stopped.  His cardiologist is Dr. Dulce Sellar.  He denies any previous history of myocardial infarction.  Has had previous coronary bypass surgery.  Past Medical History:  Diagnosis Date  . Arteriosclerosis of arterial coronary artery bypass graft   . Arthritis   . Atrial contractions, premature   . Atrial fibrillation (HCC)   . Benign hypertension   . Bilateral carotid artery stenosis   . CAD (coronary artery disease), autologous vein bypass graft 04/19/2013     100% flush occlusion of SVG-OM & SVG-Diag; near Subtotal Occlusion of SVG-RCA (PCI of RCA done to avoid distal embolization with PCI to SVG)  . CAD (coronary artery disease), native coronary artery 2007   Multivessel CAD referred for CABG  . CAD S/P percutaneous coronary angioplasty  04/19/2013   PCI of Native RCA - Promus Premier DES 3.0 mm x 38 mm (3.6 mm);   [name] Dyslipidemia   . Dyslipidemia, goal LDL below 70   . GERD (gastroesophageal reflux disease)   . History of DVT of lower extremity    Right popliteal vein  . History of stroke August 2011   Possible TIA in 2001;  Echo: EF 45-50%, mild HK of anterior septum.  [name] History of transient cerebral ischemia   . Hx of orthostatic hypotension 2009   Positive tilt table:  dizziness/vasopressor type; previously on the decision   . Hx SBO   . Nephrolithiasis   . Orthostatic hypotension   . S/P CABG x 3 2007   LIMA-LAD, SVG-DIAG, SVG-RCA  . Stroke (HCC)   . Unsteady gait     Family History  Problem Relation Age of Onset  . Colon cancer Father   . Hypertension Son     SOCIAL HISTORY: Social History   Socioeconomic History  . Marital status: Married    Spouse name: Not on file  . Number of children: Not on file  . Years of education: Not on file  . Highest  education level: Not on file  Occupational History  . Not on file  Social Needs  . Financial resource strain: Not on file  . Food insecurity:    Worry: Not on file    Inability: Not on file  . Transportation needs:    Medical: Not on file    Non-medical: Not on file  Tobacco Use  . Smoking status: Never Smoker  . Smokeless tobacco: Never Used  Substance and Sexual Activity  . Alcohol use: No  . Drug use: No  . Sexual activity: Not on file  Lifestyle  . Physical activity:    Days per week: Not on file    Minutes per session: Not on file  . Stress: Not on file  Relationships  . Social connections:    Talks on phone: Not on file    Gets together: Not on file     Attends religious service: Not on file    Active member of club or organization: Not on file    Attends meetings of clubs or organizations: Not on file    Relationship status: Not on file  . Intimate partner violence:    Fear of current or ex partner: Not on file    Emotionally abused: Not on file    Physically abused: Not on file    Forced sexual activity: Not on file  Other Topics Concern  . Not on file  Social History Narrative    He is now remarried for the last 2 years, his first wife of 60 years is deceased. He has 1   child from that first marriage and 2 grandchildren.    He does not smoke, does not drink.    He is not very active but he does kind of meander around with his new wife who is permanently disabled.     Allergies  Allergen Reactions  . Amiodarone Other (See Comments)    Extreme dehydration  . Diltiazem Other (See Comments)    Pt unsure of reaction  . Statins   . Sulfa Antibiotics Other (See Comments)    Pt unsure of reaction    Current Outpatient Medications  Medication Sig Dispense Refill  . acetaminophen (TYLENOL) 500 MG tablet Take 1,000 mg by mouth at bedtime.    [name] apixaban (ELIQUIS) 5 MG TABS tablet Take 1 tablet (5 mg total) by mouth 2 (two) times daily. 60 tablet 3  . atorvastatin (LIPITOR) 40 MG tablet TAKE 1 TABLET BY MOUTH DAILY AT 6:00 PM (Patient taking differently: TAKE 0.5 TABLET BY MOUTH DAILY AT 6:00 PM) 90 tablet 3  . dutasteride (AVODART) 0.5 MG capsule Take 0.5 mg by mouth daily.     . metoprolol tartrate (LOPRESSOR) 25 MG tablet Take 12.5 mg by mouth daily.    . midodrine (PROAMATINE) 10 MG tablet TAKE 1 TABLET(10 MG) BY MOUTH THREE TIMES DAILY WITH MEALS 90 tablet 2  . nitroGLYCERIN (NITROSTAT) 0.4 MG SL tablet Place 1 tablet (0.4 mg total) under the tongue every 5 (five) minutes as needed for chest pain. 25 tablet 3  . Polyvinyl Alcohol-Povidone (REFRESH OP) Place 1 drop into both eyes daily as needed.    [name] PULMICORT FLEXHALER 180  MCG/ACT inhaler Inhale 2 puffs into the lungs daily.     . ranitidine (ZANTAC) 150 MG tablet Take 150 mg by mouth 2 (two) times daily as needed for heartburn.      No current facility-administered medications for this visit.     REVIEW OF SYSTEMS:  [  X] denotes positive finding, [ ]  denotes negative finding Cardiac  Comments:  Chest pain or chest pressure:    Shortness of breath upon exertion: x   Short of breath when lying flat:    Irregular heart rhythm: x       Vascular    Pain in calf, thigh, or hip brought on by ambulation: x   Pain in feet at night that wakes you up from your sleep:     Blood clot in your veins:    Leg swelling:         Pulmonary    Oxygen at home:    Productive cough:  x   Wheezing:         Neurologic    Sudden weakness in arms or legs:     Sudden numbness in arms or legs:  x   Sudden onset of difficulty speaking or slurred speech:    Temporary loss of vision in one eye:     Problems with dizziness:  x       Gastrointestinal    Blood in stool:     Vomited blood:         Genitourinary    Burning when urinating:     Blood in urine:        Psychiatric    Major depression:         Hematologic    Bleeding problems:    Problems with blood clotting too easily:        Skin    Rashes or ulcers:        Constitutional    Fever or chills:     PHYSICAL EXAM:   Vitals:   02/07/18 1351 02/07/18 1407  BP: 121/85 129/90  Pulse: 78   Resp: 18   SpO2: 95%   Weight: 195 lb 11.2 oz (88.8 kg)   Height: 6\' 2"  (1.88 m)     GENERAL: The patient is a well-nourished male, in no acute distress. The vital signs are documented above. CARDIAC: There is a regular rate and rhythm.  VASCULAR: I do not detect carotid bruits. He has palpable femoral, popliteal, and posterior tibial pulses bilaterally. He has no significant lower extremity swelling. PULMONARY: There is good air exchange bilaterally without wheezing or rales. ABDOMEN: Soft and non-tender with  normal pitched bowel sounds.  I do not palpate an abdominal aortic aneurysm. MUSCULOSKELETAL: There are no major deformities or cyanosis. NEUROLOGIC: No focal weakness or paresthesias are detected. SKIN: There are no ulcers or rashes noted. PSYCHIATRIC: The patient has a normal affect.  DATA:    CAROTID DUPLEX: I have independently interpreted his carotid duplex scan today.  There is a less than 39% stenosis bilaterally.  Both vertebral arteries are patent with antegrade flow.  CT ANGIOGRAM NECK: CT angiogram [facility] the neck at [facility] on 01/03/2018 reportedly showed a less than 50% right carotid stenosis with a 50 to 69% left carotid stenosis.  ECHO: Patient had an echo in June 2019.  This showed chronic heart failure with reduced ejection fraction and diastolic dysfunction.  Ejection fraction was 40 to 45%.    LABS:  His GFR is greater than 60.  MEDICAL ISSUES:   POSSIBLE RIGHT HEMISPHERIC TIA: This patient has had transient episodes of paresthesias in the left upper extremity with some left facial drooping.  He reportedly had a CT angiogram at [facility] which showed no significant stenosis on the right carotid but a 50 to 69% left carotid stenosis.  Our duplex scan today shows no significant carotid disease on either side.  Given that the duplex looks normal I am reluctant to consider an aggressive approach to further work-up unless he develops recurrent symptoms.  He is scheduled to see neurology in August.  From my standpoint, the only other consideration would be cerebral arteriography in order to look for an ulceration on the right that is not being seen by duplex or CT.  However I have explained there is a 1% risk of stroke with cerebral arteriography and therefore I would not favor that in a 84 year old at this point.  I have recommended a follow-up duplex scan in 6 months and I will see him back at that time.  If he has repeated symptoms then certainly we could consider  cerebral arteriography.   Waverly Ferrari [facility] [phone]

## 2018-02-10 DIAGNOSIS — I10 Essential (primary) hypertension: Secondary | ICD-10-CM | POA: Diagnosis not present

## 2018-02-10 DIAGNOSIS — M25562 Pain in left knee: Secondary | ICD-10-CM | POA: Diagnosis not present

## 2018-02-10 DIAGNOSIS — W19XXXA Unspecified fall, initial encounter: Secondary | ICD-10-CM | POA: Diagnosis not present

## 2018-02-10 DIAGNOSIS — W19XXXD Unspecified fall, subsequent encounter: Secondary | ICD-10-CM | POA: Diagnosis not present

## 2018-02-10 DIAGNOSIS — S82852D Displaced trimalleolar fracture of left lower leg, subsequent encounter for closed fracture with routine healing: Secondary | ICD-10-CM | POA: Diagnosis not present

## 2018-02-10 DIAGNOSIS — Z951 Presence of aortocoronary bypass graft: Secondary | ICD-10-CM | POA: Diagnosis not present

## 2018-02-10 DIAGNOSIS — R55 Syncope and collapse: Secondary | ICD-10-CM | POA: Diagnosis not present

## 2018-02-10 DIAGNOSIS — E785 Hyperlipidemia, unspecified: Secondary | ICD-10-CM | POA: Diagnosis not present

## 2018-02-10 DIAGNOSIS — T148XXA Other injury of unspecified body region, initial encounter: Secondary | ICD-10-CM | POA: Diagnosis not present

## 2018-02-10 DIAGNOSIS — R609 Edema, unspecified: Secondary | ICD-10-CM | POA: Diagnosis not present

## 2018-02-10 DIAGNOSIS — S8992XA Unspecified injury of left lower leg, initial encounter: Secondary | ICD-10-CM | POA: Diagnosis not present

## 2018-02-10 DIAGNOSIS — Z87891 Personal history of nicotine dependence: Secondary | ICD-10-CM | POA: Diagnosis not present

## 2018-02-10 DIAGNOSIS — Z7401 Bed confinement status: Secondary | ICD-10-CM | POA: Diagnosis not present

## 2018-02-10 DIAGNOSIS — K219 Gastro-esophageal reflux disease without esophagitis: Secondary | ICD-10-CM | POA: Diagnosis present

## 2018-02-10 DIAGNOSIS — I4891 Unspecified atrial fibrillation: Secondary | ICD-10-CM | POA: Diagnosis not present

## 2018-02-10 DIAGNOSIS — I2510 Atherosclerotic heart disease of native coronary artery without angina pectoris: Secondary | ICD-10-CM | POA: Diagnosis not present

## 2018-02-10 DIAGNOSIS — I480 Paroxysmal atrial fibrillation: Secondary | ICD-10-CM | POA: Diagnosis present

## 2018-02-10 DIAGNOSIS — Z7901 Long term (current) use of anticoagulants: Secondary | ICD-10-CM | POA: Diagnosis not present

## 2018-02-10 DIAGNOSIS — I959 Hypotension, unspecified: Secondary | ICD-10-CM | POA: Diagnosis present

## 2018-02-10 DIAGNOSIS — R5381 Other malaise: Secondary | ICD-10-CM | POA: Diagnosis not present

## 2018-02-10 DIAGNOSIS — Z8673 Personal history of transient ischemic attack (TIA), and cerebral infarction without residual deficits: Secondary | ICD-10-CM | POA: Diagnosis not present

## 2018-02-10 DIAGNOSIS — I9589 Other hypotension: Secondary | ICD-10-CM | POA: Diagnosis not present

## 2018-02-10 DIAGNOSIS — M2550 Pain in unspecified joint: Secondary | ICD-10-CM | POA: Diagnosis not present

## 2018-02-10 DIAGNOSIS — R531 Weakness: Secondary | ICD-10-CM | POA: Diagnosis not present

## 2018-02-10 DIAGNOSIS — I482 Chronic atrial fibrillation: Secondary | ICD-10-CM | POA: Diagnosis not present

## 2018-02-10 DIAGNOSIS — R262 Difficulty in walking, not elsewhere classified: Secondary | ICD-10-CM | POA: Diagnosis not present

## 2018-02-10 DIAGNOSIS — Z9181 History of falling: Secondary | ICD-10-CM | POA: Diagnosis not present

## 2018-02-10 DIAGNOSIS — Z79899 Other long term (current) drug therapy: Secondary | ICD-10-CM | POA: Diagnosis not present

## 2018-02-10 DIAGNOSIS — M25572 Pain in left ankle and joints of left foot: Secondary | ICD-10-CM | POA: Diagnosis not present

## 2018-02-10 DIAGNOSIS — G8918 Other acute postprocedural pain: Secondary | ICD-10-CM | POA: Diagnosis not present

## 2018-02-10 DIAGNOSIS — S82852A Displaced trimalleolar fracture of left lower leg, initial encounter for closed fracture: Secondary | ICD-10-CM | POA: Diagnosis not present

## 2018-02-11 DIAGNOSIS — R55 Syncope and collapse: Secondary | ICD-10-CM

## 2018-02-14 DIAGNOSIS — G8918 Other acute postprocedural pain: Secondary | ICD-10-CM | POA: Diagnosis not present

## 2018-02-14 DIAGNOSIS — W19XXXD Unspecified fall, subsequent encounter: Secondary | ICD-10-CM | POA: Diagnosis not present

## 2018-02-14 DIAGNOSIS — S82852D Displaced trimalleolar fracture of left lower leg, subsequent encounter for closed fracture with routine healing: Secondary | ICD-10-CM | POA: Diagnosis not present

## 2018-02-14 DIAGNOSIS — M810 Age-related osteoporosis without current pathological fracture: Secondary | ICD-10-CM | POA: Diagnosis not present

## 2018-02-14 DIAGNOSIS — S82852A Displaced trimalleolar fracture of left lower leg, initial encounter for closed fracture: Secondary | ICD-10-CM | POA: Diagnosis not present

## 2018-02-14 DIAGNOSIS — I9589 Other hypotension: Secondary | ICD-10-CM | POA: Diagnosis not present

## 2018-02-14 DIAGNOSIS — M2550 Pain in unspecified joint: Secondary | ICD-10-CM | POA: Diagnosis not present

## 2018-02-14 DIAGNOSIS — Z951 Presence of aortocoronary bypass graft: Secondary | ICD-10-CM | POA: Diagnosis not present

## 2018-02-14 DIAGNOSIS — W19XXXA Unspecified fall, initial encounter: Secondary | ICD-10-CM | POA: Diagnosis not present

## 2018-02-14 DIAGNOSIS — D649 Anemia, unspecified: Secondary | ICD-10-CM | POA: Diagnosis not present

## 2018-02-14 DIAGNOSIS — I10 Essential (primary) hypertension: Secondary | ICD-10-CM | POA: Diagnosis not present

## 2018-02-14 DIAGNOSIS — I482 Chronic atrial fibrillation: Secondary | ICD-10-CM | POA: Diagnosis not present

## 2018-02-14 DIAGNOSIS — Z7401 Bed confinement status: Secondary | ICD-10-CM | POA: Diagnosis not present

## 2018-02-14 DIAGNOSIS — R262 Difficulty in walking, not elsewhere classified: Secondary | ICD-10-CM | POA: Diagnosis not present

## 2018-02-14 DIAGNOSIS — E785 Hyperlipidemia, unspecified: Secondary | ICD-10-CM | POA: Diagnosis not present

## 2018-02-17 DIAGNOSIS — D649 Anemia, unspecified: Secondary | ICD-10-CM | POA: Diagnosis not present

## 2018-02-17 DIAGNOSIS — R262 Difficulty in walking, not elsewhere classified: Secondary | ICD-10-CM | POA: Diagnosis not present

## 2018-02-17 DIAGNOSIS — G8918 Other acute postprocedural pain: Secondary | ICD-10-CM | POA: Diagnosis not present

## 2018-02-17 DIAGNOSIS — M810 Age-related osteoporosis without current pathological fracture: Secondary | ICD-10-CM | POA: Diagnosis not present

## 2018-02-21 DIAGNOSIS — S82852A Displaced trimalleolar fracture of left lower leg, initial encounter for closed fracture: Secondary | ICD-10-CM | POA: Diagnosis not present

## 2018-02-22 ENCOUNTER — Other Ambulatory Visit: Payer: Self-pay | Admitting: Cardiology

## 2018-02-22 DIAGNOSIS — I25810 Atherosclerosis of coronary artery bypass graft(s) without angina pectoris: Secondary | ICD-10-CM

## 2018-02-22 DIAGNOSIS — I480 Paroxysmal atrial fibrillation: Secondary | ICD-10-CM

## 2018-02-22 DIAGNOSIS — I255 Ischemic cardiomyopathy: Secondary | ICD-10-CM

## 2018-02-22 DIAGNOSIS — I2510 Atherosclerotic heart disease of native coronary artery without angina pectoris: Secondary | ICD-10-CM

## 2018-02-22 DIAGNOSIS — Z9861 Coronary angioplasty status: Secondary | ICD-10-CM

## 2018-02-26 DIAGNOSIS — S82852A Displaced trimalleolar fracture of left lower leg, initial encounter for closed fracture: Secondary | ICD-10-CM | POA: Diagnosis not present

## 2018-03-05 ENCOUNTER — Other Ambulatory Visit: Payer: Self-pay

## 2018-03-05 DIAGNOSIS — I6523 Occlusion and stenosis of bilateral carotid arteries: Secondary | ICD-10-CM

## 2018-03-06 ENCOUNTER — Other Ambulatory Visit: Payer: Self-pay | Admitting: Cardiology

## 2018-03-09 DIAGNOSIS — I2510 Atherosclerotic heart disease of native coronary artery without angina pectoris: Secondary | ICD-10-CM | POA: Diagnosis not present

## 2018-03-09 DIAGNOSIS — Z9181 History of falling: Secondary | ICD-10-CM | POA: Diagnosis not present

## 2018-03-09 DIAGNOSIS — Z7901 Long term (current) use of anticoagulants: Secondary | ICD-10-CM | POA: Diagnosis not present

## 2018-03-09 DIAGNOSIS — I10 Essential (primary) hypertension: Secondary | ICD-10-CM | POA: Diagnosis not present

## 2018-03-09 DIAGNOSIS — I9589 Other hypotension: Secondary | ICD-10-CM | POA: Diagnosis not present

## 2018-03-09 DIAGNOSIS — M80062D Age-related osteoporosis with current pathological fracture, left lower leg, subsequent encounter for fracture with routine healing: Secondary | ICD-10-CM | POA: Diagnosis not present

## 2018-03-09 DIAGNOSIS — Z951 Presence of aortocoronary bypass graft: Secondary | ICD-10-CM | POA: Diagnosis not present

## 2018-03-09 DIAGNOSIS — N400 Enlarged prostate without lower urinary tract symptoms: Secondary | ICD-10-CM | POA: Diagnosis not present

## 2018-03-09 DIAGNOSIS — I482 Chronic atrial fibrillation: Secondary | ICD-10-CM | POA: Diagnosis not present

## 2018-03-09 DIAGNOSIS — E7800 Pure hypercholesterolemia, unspecified: Secondary | ICD-10-CM | POA: Diagnosis not present

## 2018-03-10 DIAGNOSIS — I10 Essential (primary) hypertension: Secondary | ICD-10-CM | POA: Diagnosis not present

## 2018-03-10 DIAGNOSIS — I2510 Atherosclerotic heart disease of native coronary artery without angina pectoris: Secondary | ICD-10-CM | POA: Diagnosis not present

## 2018-03-10 DIAGNOSIS — I9589 Other hypotension: Secondary | ICD-10-CM | POA: Diagnosis not present

## 2018-03-10 DIAGNOSIS — I482 Chronic atrial fibrillation: Secondary | ICD-10-CM | POA: Diagnosis not present

## 2018-03-10 DIAGNOSIS — M80062D Age-related osteoporosis with current pathological fracture, left lower leg, subsequent encounter for fracture with routine healing: Secondary | ICD-10-CM | POA: Diagnosis not present

## 2018-03-10 DIAGNOSIS — N400 Enlarged prostate without lower urinary tract symptoms: Secondary | ICD-10-CM | POA: Diagnosis not present

## 2018-03-13 DIAGNOSIS — I10 Essential (primary) hypertension: Secondary | ICD-10-CM | POA: Diagnosis not present

## 2018-03-13 DIAGNOSIS — N400 Enlarged prostate without lower urinary tract symptoms: Secondary | ICD-10-CM | POA: Diagnosis not present

## 2018-03-13 DIAGNOSIS — M80062D Age-related osteoporosis with current pathological fracture, left lower leg, subsequent encounter for fracture with routine healing: Secondary | ICD-10-CM | POA: Diagnosis not present

## 2018-03-13 DIAGNOSIS — I482 Chronic atrial fibrillation: Secondary | ICD-10-CM | POA: Diagnosis not present

## 2018-03-13 DIAGNOSIS — I2510 Atherosclerotic heart disease of native coronary artery without angina pectoris: Secondary | ICD-10-CM | POA: Diagnosis not present

## 2018-03-13 DIAGNOSIS — I9589 Other hypotension: Secondary | ICD-10-CM | POA: Diagnosis not present

## 2018-03-14 ENCOUNTER — Ambulatory Visit: Payer: Medicare Other | Admitting: Neurology

## 2018-03-14 DIAGNOSIS — I9589 Other hypotension: Secondary | ICD-10-CM | POA: Diagnosis not present

## 2018-03-14 DIAGNOSIS — N400 Enlarged prostate without lower urinary tract symptoms: Secondary | ICD-10-CM | POA: Diagnosis not present

## 2018-03-14 DIAGNOSIS — I10 Essential (primary) hypertension: Secondary | ICD-10-CM | POA: Diagnosis not present

## 2018-03-14 DIAGNOSIS — M80062D Age-related osteoporosis with current pathological fracture, left lower leg, subsequent encounter for fracture with routine healing: Secondary | ICD-10-CM | POA: Diagnosis not present

## 2018-03-14 DIAGNOSIS — I482 Chronic atrial fibrillation: Secondary | ICD-10-CM | POA: Diagnosis not present

## 2018-03-14 DIAGNOSIS — I2510 Atherosclerotic heart disease of native coronary artery without angina pectoris: Secondary | ICD-10-CM | POA: Diagnosis not present

## 2018-03-16 DIAGNOSIS — I10 Essential (primary) hypertension: Secondary | ICD-10-CM | POA: Diagnosis not present

## 2018-03-16 DIAGNOSIS — I482 Chronic atrial fibrillation: Secondary | ICD-10-CM | POA: Diagnosis not present

## 2018-03-16 DIAGNOSIS — I9589 Other hypotension: Secondary | ICD-10-CM | POA: Diagnosis not present

## 2018-03-16 DIAGNOSIS — N400 Enlarged prostate without lower urinary tract symptoms: Secondary | ICD-10-CM | POA: Diagnosis not present

## 2018-03-16 DIAGNOSIS — M80062D Age-related osteoporosis with current pathological fracture, left lower leg, subsequent encounter for fracture with routine healing: Secondary | ICD-10-CM | POA: Diagnosis not present

## 2018-03-16 DIAGNOSIS — I2510 Atherosclerotic heart disease of native coronary artery without angina pectoris: Secondary | ICD-10-CM | POA: Diagnosis not present

## 2018-03-20 DIAGNOSIS — I2510 Atherosclerotic heart disease of native coronary artery without angina pectoris: Secondary | ICD-10-CM | POA: Diagnosis not present

## 2018-03-20 DIAGNOSIS — I482 Chronic atrial fibrillation: Secondary | ICD-10-CM | POA: Diagnosis not present

## 2018-03-20 DIAGNOSIS — N400 Enlarged prostate without lower urinary tract symptoms: Secondary | ICD-10-CM | POA: Diagnosis not present

## 2018-03-20 DIAGNOSIS — I10 Essential (primary) hypertension: Secondary | ICD-10-CM | POA: Diagnosis not present

## 2018-03-20 DIAGNOSIS — M80062D Age-related osteoporosis with current pathological fracture, left lower leg, subsequent encounter for fracture with routine healing: Secondary | ICD-10-CM | POA: Diagnosis not present

## 2018-03-20 DIAGNOSIS — I9589 Other hypotension: Secondary | ICD-10-CM | POA: Diagnosis not present

## 2018-03-22 DIAGNOSIS — N400 Enlarged prostate without lower urinary tract symptoms: Secondary | ICD-10-CM | POA: Diagnosis not present

## 2018-03-22 DIAGNOSIS — I2510 Atherosclerotic heart disease of native coronary artery without angina pectoris: Secondary | ICD-10-CM | POA: Diagnosis not present

## 2018-03-22 DIAGNOSIS — I482 Chronic atrial fibrillation: Secondary | ICD-10-CM | POA: Diagnosis not present

## 2018-03-22 DIAGNOSIS — I9589 Other hypotension: Secondary | ICD-10-CM | POA: Diagnosis not present

## 2018-03-22 DIAGNOSIS — I10 Essential (primary) hypertension: Secondary | ICD-10-CM | POA: Diagnosis not present

## 2018-03-22 DIAGNOSIS — M80062D Age-related osteoporosis with current pathological fracture, left lower leg, subsequent encounter for fracture with routine healing: Secondary | ICD-10-CM | POA: Diagnosis not present

## 2018-03-24 ENCOUNTER — Other Ambulatory Visit: Payer: Self-pay | Admitting: Cardiology

## 2018-03-26 DIAGNOSIS — I2510 Atherosclerotic heart disease of native coronary artery without angina pectoris: Secondary | ICD-10-CM | POA: Diagnosis not present

## 2018-03-26 DIAGNOSIS — M80062D Age-related osteoporosis with current pathological fracture, left lower leg, subsequent encounter for fracture with routine healing: Secondary | ICD-10-CM | POA: Diagnosis not present

## 2018-03-26 DIAGNOSIS — I10 Essential (primary) hypertension: Secondary | ICD-10-CM | POA: Diagnosis not present

## 2018-03-26 DIAGNOSIS — N400 Enlarged prostate without lower urinary tract symptoms: Secondary | ICD-10-CM | POA: Diagnosis not present

## 2018-03-26 DIAGNOSIS — I482 Chronic atrial fibrillation: Secondary | ICD-10-CM | POA: Diagnosis not present

## 2018-03-26 DIAGNOSIS — I9589 Other hypotension: Secondary | ICD-10-CM | POA: Diagnosis not present

## 2018-03-26 DIAGNOSIS — S82852A Displaced trimalleolar fracture of left lower leg, initial encounter for closed fracture: Secondary | ICD-10-CM | POA: Diagnosis not present

## 2018-03-27 DIAGNOSIS — M80062D Age-related osteoporosis with current pathological fracture, left lower leg, subsequent encounter for fracture with routine healing: Secondary | ICD-10-CM | POA: Diagnosis not present

## 2018-03-27 DIAGNOSIS — N400 Enlarged prostate without lower urinary tract symptoms: Secondary | ICD-10-CM | POA: Diagnosis not present

## 2018-03-27 DIAGNOSIS — I2510 Atherosclerotic heart disease of native coronary artery without angina pectoris: Secondary | ICD-10-CM | POA: Diagnosis not present

## 2018-03-27 DIAGNOSIS — I9589 Other hypotension: Secondary | ICD-10-CM | POA: Diagnosis not present

## 2018-03-27 DIAGNOSIS — I482 Chronic atrial fibrillation: Secondary | ICD-10-CM | POA: Diagnosis not present

## 2018-03-27 DIAGNOSIS — I10 Essential (primary) hypertension: Secondary | ICD-10-CM | POA: Diagnosis not present

## 2018-03-28 DIAGNOSIS — I9589 Other hypotension: Secondary | ICD-10-CM | POA: Diagnosis not present

## 2018-03-28 DIAGNOSIS — I2510 Atherosclerotic heart disease of native coronary artery without angina pectoris: Secondary | ICD-10-CM | POA: Diagnosis not present

## 2018-03-28 DIAGNOSIS — I10 Essential (primary) hypertension: Secondary | ICD-10-CM | POA: Diagnosis not present

## 2018-03-28 DIAGNOSIS — N400 Enlarged prostate without lower urinary tract symptoms: Secondary | ICD-10-CM | POA: Diagnosis not present

## 2018-03-28 DIAGNOSIS — I482 Chronic atrial fibrillation: Secondary | ICD-10-CM | POA: Diagnosis not present

## 2018-03-28 DIAGNOSIS — M80062D Age-related osteoporosis with current pathological fracture, left lower leg, subsequent encounter for fracture with routine healing: Secondary | ICD-10-CM | POA: Diagnosis not present

## 2018-03-30 DIAGNOSIS — N400 Enlarged prostate without lower urinary tract symptoms: Secondary | ICD-10-CM | POA: Diagnosis not present

## 2018-03-30 DIAGNOSIS — I9589 Other hypotension: Secondary | ICD-10-CM | POA: Diagnosis not present

## 2018-03-30 DIAGNOSIS — M80062D Age-related osteoporosis with current pathological fracture, left lower leg, subsequent encounter for fracture with routine healing: Secondary | ICD-10-CM | POA: Diagnosis not present

## 2018-03-30 DIAGNOSIS — I482 Chronic atrial fibrillation: Secondary | ICD-10-CM | POA: Diagnosis not present

## 2018-03-30 DIAGNOSIS — I2510 Atherosclerotic heart disease of native coronary artery without angina pectoris: Secondary | ICD-10-CM | POA: Diagnosis not present

## 2018-03-30 DIAGNOSIS — I10 Essential (primary) hypertension: Secondary | ICD-10-CM | POA: Diagnosis not present

## 2018-04-03 DIAGNOSIS — I10 Essential (primary) hypertension: Secondary | ICD-10-CM | POA: Diagnosis not present

## 2018-04-03 DIAGNOSIS — I482 Chronic atrial fibrillation: Secondary | ICD-10-CM | POA: Diagnosis not present

## 2018-04-03 DIAGNOSIS — M80062D Age-related osteoporosis with current pathological fracture, left lower leg, subsequent encounter for fracture with routine healing: Secondary | ICD-10-CM | POA: Diagnosis not present

## 2018-04-03 DIAGNOSIS — I9589 Other hypotension: Secondary | ICD-10-CM | POA: Diagnosis not present

## 2018-04-03 DIAGNOSIS — I2510 Atherosclerotic heart disease of native coronary artery without angina pectoris: Secondary | ICD-10-CM | POA: Diagnosis not present

## 2018-04-03 DIAGNOSIS — N400 Enlarged prostate without lower urinary tract symptoms: Secondary | ICD-10-CM | POA: Diagnosis not present

## 2018-04-06 ENCOUNTER — Ambulatory Visit (INDEPENDENT_AMBULATORY_CARE_PROVIDER_SITE_OTHER): Payer: Medicare Other

## 2018-04-06 DIAGNOSIS — N400 Enlarged prostate without lower urinary tract symptoms: Secondary | ICD-10-CM | POA: Diagnosis not present

## 2018-04-06 DIAGNOSIS — M80062D Age-related osteoporosis with current pathological fracture, left lower leg, subsequent encounter for fracture with routine healing: Secondary | ICD-10-CM | POA: Diagnosis not present

## 2018-04-06 DIAGNOSIS — I482 Chronic atrial fibrillation: Secondary | ICD-10-CM | POA: Diagnosis not present

## 2018-04-06 DIAGNOSIS — I255 Ischemic cardiomyopathy: Secondary | ICD-10-CM | POA: Diagnosis not present

## 2018-04-06 DIAGNOSIS — I480 Paroxysmal atrial fibrillation: Secondary | ICD-10-CM

## 2018-04-06 DIAGNOSIS — I10 Essential (primary) hypertension: Secondary | ICD-10-CM | POA: Diagnosis not present

## 2018-04-06 DIAGNOSIS — Z9861 Coronary angioplasty status: Secondary | ICD-10-CM

## 2018-04-06 DIAGNOSIS — I2510 Atherosclerotic heart disease of native coronary artery without angina pectoris: Secondary | ICD-10-CM | POA: Diagnosis not present

## 2018-04-06 DIAGNOSIS — I9589 Other hypotension: Secondary | ICD-10-CM | POA: Diagnosis not present

## 2018-04-06 DIAGNOSIS — I25810 Atherosclerosis of coronary artery bypass graft(s) without angina pectoris: Secondary | ICD-10-CM

## 2018-04-11 DIAGNOSIS — I482 Chronic atrial fibrillation: Secondary | ICD-10-CM | POA: Diagnosis not present

## 2018-04-11 DIAGNOSIS — I10 Essential (primary) hypertension: Secondary | ICD-10-CM | POA: Diagnosis not present

## 2018-04-11 DIAGNOSIS — I2510 Atherosclerotic heart disease of native coronary artery without angina pectoris: Secondary | ICD-10-CM | POA: Diagnosis not present

## 2018-04-11 DIAGNOSIS — M80062D Age-related osteoporosis with current pathological fracture, left lower leg, subsequent encounter for fracture with routine healing: Secondary | ICD-10-CM | POA: Diagnosis not present

## 2018-04-11 DIAGNOSIS — I9589 Other hypotension: Secondary | ICD-10-CM | POA: Diagnosis not present

## 2018-04-11 DIAGNOSIS — N400 Enlarged prostate without lower urinary tract symptoms: Secondary | ICD-10-CM | POA: Diagnosis not present

## 2018-04-12 DIAGNOSIS — M80062D Age-related osteoporosis with current pathological fracture, left lower leg, subsequent encounter for fracture with routine healing: Secondary | ICD-10-CM | POA: Diagnosis not present

## 2018-04-12 DIAGNOSIS — I9589 Other hypotension: Secondary | ICD-10-CM | POA: Diagnosis not present

## 2018-04-12 DIAGNOSIS — I2510 Atherosclerotic heart disease of native coronary artery without angina pectoris: Secondary | ICD-10-CM | POA: Diagnosis not present

## 2018-04-12 DIAGNOSIS — I10 Essential (primary) hypertension: Secondary | ICD-10-CM | POA: Diagnosis not present

## 2018-04-12 DIAGNOSIS — N400 Enlarged prostate without lower urinary tract symptoms: Secondary | ICD-10-CM | POA: Diagnosis not present

## 2018-04-12 DIAGNOSIS — I482 Chronic atrial fibrillation: Secondary | ICD-10-CM | POA: Diagnosis not present

## 2018-04-16 ENCOUNTER — Telehealth: Payer: Self-pay | Admitting: Cardiology

## 2018-04-16 DIAGNOSIS — I2510 Atherosclerotic heart disease of native coronary artery without angina pectoris: Secondary | ICD-10-CM | POA: Diagnosis not present

## 2018-04-16 DIAGNOSIS — N400 Enlarged prostate without lower urinary tract symptoms: Secondary | ICD-10-CM | POA: Diagnosis not present

## 2018-04-16 DIAGNOSIS — I10 Essential (primary) hypertension: Secondary | ICD-10-CM | POA: Diagnosis not present

## 2018-04-16 DIAGNOSIS — I9589 Other hypotension: Secondary | ICD-10-CM | POA: Diagnosis not present

## 2018-04-16 DIAGNOSIS — I482 Chronic atrial fibrillation: Secondary | ICD-10-CM | POA: Diagnosis not present

## 2018-04-16 DIAGNOSIS — M80062D Age-related osteoporosis with current pathological fracture, left lower leg, subsequent encounter for fracture with routine healing: Secondary | ICD-10-CM | POA: Diagnosis not present

## 2018-04-16 NOTE — Progress Notes (Signed)
Cardiology Office Note:    Date:  04/17/2018   ID:  JACQUEL Chambers, DOB 09-14-1922, MRN [id number]  PCP:  Street, Stephanie Coup, MD  Cardiologist:  Norman Herrlich, MD    Referring MD: [address], Stephanie Coup, *    ASSESSMENT:    1. Orthostatic hypotension   2. Coronary artery disease involving coronary bypass graft of native heart without angina pectoris; CAD (coronary artery disease) of artery bypass graft:  loss of VG to RCA and VG-Diag   3. PAF (paroxysmal atrial fibrillation) (HCC)   4. Chronic anticoagulation    PLAN:    In order of problems listed above:  1. This episode was very characteristic of orthostatic hypotension documented related to noncompliance of medications his wife will attempt to supervised to be sure to take it as directed. 2. Stable presently having no angina will continue medical therapy, await return of his external loop recorder to review for bradycardia and recurrent atrial fibrillation.  At this time I do not think he requires an antiarrhythmic drug or pacemaker 3. He remains anticoagulated he has had no clinical recurrence 4. Continue his anticoagulant   Next appointment: As previously scheduled   Medication Adjustments/Labs and Tests Ordered: Current medicines are reviewed at length with the patient today.  Concerns regarding medicines are outlined above.  No orders of the defined types were placed in this encounter.  No orders of the defined types were placed in this encounter.   Chief Complaint  Patient presents with  . Hypotension    blood pressure too low yesteray    History of Present Illness:    Joshua Chambers is a 84 y.o. male with a hx of CAD CABG in 2007,  hypertension, orthostatic hypotension, APC's, and TIA   CAD history: s/p CABG in 2007 with LIMA-LAD, SVG-D1, and SVG-RCA, flush occlusion of SVG-OM and SVG-D1 by cath in 2014, 99% occlusion of SVG-RCA with DES placed to native RCA at that time).  He also has hypertension, with  orthostatic hypotension. He also has hyperlipidemia and history of DVT.  He was  last seen by me 97/23/19 with TIA and atrial fibrillation.  Compliance with diet, lifestyle and medications: No he is not taking his alpha agonist as directed  Recently had a fracture left ankle ORIF and had skilled nursing and rehab hospitalization.  He is wearing a external loop recorder and yesterday had an episode where he was lightheaded and a visiting nurse documented systolic blood pressure 80-90.  His pulse was not irregular and was not slow and did not appear to have recurrent atrial fibrillation.  He comes the office and it becomes obvious that he is missing 1 of his doses of an alpha agonist that he takes for symptomatic orthostatic hypotension.  He had no chest pain palpitation did not have syncope.  The episode was brief and resolved when he placed himself supine he did not lose consciousness Past Medical History:  Diagnosis Date  . Arteriosclerosis of arterial coronary artery bypass graft   . Arthritis   . Atrial contractions, premature   . Atrial fibrillation (HCC)   . Benign hypertension   . Bilateral carotid artery stenosis   . CAD (coronary artery disease), autologous vein bypass graft 04/19/2013   100% flush occlusion of SVG-OM & SVG-Diag; near Subtotal Occlusion of SVG-RCA (PCI of RCA done to avoid distal embolization with PCI to SVG)  . CAD (coronary artery disease), native coronary artery 2007   Multivessel CAD referred for CABG  .  CAD S/P percutaneous coronary angioplasty  04/19/2013   PCI of Native RCA - Promus Premier DES 3.0 mm x 38 mm (3.6 mm);   [name] Dyslipidemia   . Dyslipidemia, goal LDL below 70   . GERD (gastroesophageal reflux disease)   . History of DVT of lower extremity    Right popliteal vein  . History of stroke August 2011   Possible TIA in 2001;  Echo: EF 45-50%, mild HK of anterior septum.  [name] History of transient cerebral ischemia   . Hx of orthostatic hypotension 2009     Positive tilt table:  dizziness/vasopressor type; previously on the decision   . Hx SBO   . Nephrolithiasis   . Orthostatic hypotension   . S/P CABG x 3 2007   LIMA-LAD, SVG-DIAG, SVG-RCA  . Stroke (HCC)   . Unsteady gait     Past Surgical History:  Procedure Laterality Date  . APPENDECTOMY    . CARDIAC SURGERY  2000s  . COLON SURGERY    . CORONARY ANGIOPLASTY WITH STENT PLACEMENT  04/19/13   Stent-Promus DES (3.0 mm x 38 mm - 3.6 mm) to native RCA prox-mid,  . CORONARY ARTERY BYPASS GRAFT  2007   LIMA-LAD, SVG-D1, SVG-RCA  . LEFT HEART CATHETERIZATION WITH CORONARY/GRAFT ANGIOGRAM Left 04/19/2013   LEFT HEART CATH W/ CORONARY/GRAFT ANGIOGRAM;  [name], MD;  Location: [facility] CATH LAB;  LM 20%-->LAD 70-80% then subTO after D1 (Patent LIMA-LAD); Small non-dom Cx ~20% ostial.  RCA long 60-70% prox --> 90% mid.  SVG-OM & SVG-Diag 100%, diffuse Subtotal occlusion of SVG-rPD  . STENT PLACEMENT VASCULAR ([facility] HX)    . TONSILLECTOMY      Current Medications: Current Meds  Medication Sig  . acetaminophen (TYLENOL) 500 MG tablet Take 1,000 mg by mouth at bedtime.  [name] apixaban (ELIQUIS) 5 MG TABS tablet Take 1 tablet (5 mg total) by mouth 2 (two) times daily.  [name] atorvastatin (LIPITOR) 40 MG tablet TAKE 1 TABLET BY MOUTH DAILY AT 6:00 PM (Patient taking differently: TAKE 0.5 TABLET BY MOUTH DAILY AT 6:00 PM)  . dutasteride (AVODART) 0.5 MG capsule Take 0.5 mg by mouth daily.   . metoprolol tartrate (LOPRESSOR) 25 MG tablet Take 12.5 mg by mouth daily.  . midodrine (PROAMATINE) 10 MG tablet TAKE 1 TABLET(10 MG) BY MOUTH THREE TIMES DAILY WITH MEALS  . nitroGLYCERIN (NITROSTAT) 0.4 MG SL tablet Place 1 tablet (0.4 mg total) under the tongue every 5 (five) minutes as needed for chest pain.  . Polyvinyl Alcohol-Povidone (REFRESH OP) Place 1 drop into both eyes daily as needed.  [name] PULMICORT FLEXHALER 180 MCG/ACT inhaler Inhale 2 puffs into the lungs daily.      Allergies:   Amiodarone;  Diltiazem; Statins; and Sulfa antibiotics   Social History   Socioeconomic History  . Marital status: Married    Spouse name: Not on file  . Number of children: Not on file  . Years of education: Not on file  . Highest education level: Not on file  Occupational History  . Not on file  Social Needs  . Financial resource strain: Not on file  . Food insecurity:    Worry: Not on file    Inability: Not on file  . Transportation needs:    Medical: Not on file    Non-medical: Not on file  Tobacco Use  . Smoking status: Never Smoker  . Smokeless tobacco: Never Used  Substance and Sexual Activity  . Alcohol use: No  .  Drug use: No  . Sexual activity: Not on file  Lifestyle  . Physical activity:    Days per week: Not on file    Minutes per session: Not on file  . Stress: Not on file  Relationships  . Social connections:    Talks on phone: Not on file    Gets together: Not on file    Attends religious service: Not on file    Active member of club or organization: Not on file    Attends meetings of clubs or organizations: Not on file    Relationship status: Not on file  Other Topics Concern  . Not on file  Social History Narrative    He is now remarried for the last 2 years, his first wife of 60 years is deceased. He has 1   child from that first marriage and 2 grandchildren.    He does not smoke, does not drink.    He is not very active but he does kind of meander around with his new wife who is permanently disabled.      Family History: The patient's family history includes Colon cancer in his father; Hypertension in his son. ROS:   Please see the history of present illness.    All other systems reviewed and are negative.  EKGs/Labs/Other Studies Reviewed:    The following studies were reviewed today:  I did not order an EKG as he is wearing an external loop recorder is to be returned tomorrow  Recent Labs: No results found for requested labs within last 8760  hours.  Recent Lipid Panel    Component Value Date/Time   CHOL 172 03/17/2017 0006   TRIG 103 03/17/2017 0006   HDL 47 03/17/2017 0006   CHOLHDL 3.7 03/17/2017 0006   VLDL 21 03/17/2017 0006   LDLCALC 104 (H) 03/17/2017 0006    Physical Exam:    VS:  BP 110/62   Pulse 62   Ht 6\' 2"  (1.88 m)   Wt 192 lb 6.4 oz (87.3 kg)   SpO2 95%   BMI 24.70 kg/m     Wt Readings from Last 3 Encounters:  04/17/18 192 lb 6.4 oz (87.3 kg)  02/07/18 195 lb 11.2 oz (88.8 kg)  02/06/18 198 lb (89.8 kg)     GEN:  Well nourished, well developed in no acute distress HEENT: Normal NECK: No JVD; No carotid bruits LYMPHATICS: No lymphadenopathy CARDIAC: RRR, no murmurs, rubs, gallops RESPIRATORY:  Clear to auscultation without rales, wheezing or rhonchi  ABDOMEN: Soft, non-tender, non-distended MUSCULOSKELETAL:  No edema; No deformity  SKIN: Warm and dry NEUROLOGIC:  Alert and oriented x 3 PSYCHIATRIC:  Normal affect    Signed, 02/08/18, MD  04/17/2018 6:00 PM    [facility]

## 2018-04-16 NOTE — Telephone Encounter (Signed)
Patient reports feeling weak, and restless at night unable to sleep for 2 weeks with his physical therapy he recieves at home. Today was the first time his blood pressure were running this low. Patient was afebrile, respirations 14, and hear rate 68. Will consult with Dr. Dulce Sellar.

## 2018-04-16 NOTE — Telephone Encounter (Signed)
Spoke with patients wife Irving Burton and notified her of appointment with Dr Dulce Sellar on 04-17-2018 at 3:20.  Patients wife advised that if blood pressure or symptoms worsen throughout the evening or night, to report to nearest ED.    Patients wife agrees to plan and verbalized understanding.

## 2018-04-16 NOTE — Telephone Encounter (Signed)
Wants to report a low BP on the pt right arm 88/58 & left arm is 90/60

## 2018-04-16 NOTE — Telephone Encounter (Signed)
See me tomorrow

## 2018-04-17 ENCOUNTER — Encounter: Payer: Self-pay | Admitting: Cardiology

## 2018-04-17 ENCOUNTER — Ambulatory Visit (INDEPENDENT_AMBULATORY_CARE_PROVIDER_SITE_OTHER): Payer: Medicare Other | Admitting: Cardiology

## 2018-04-17 VITALS — BP 110/62 | HR 62 | Ht 74.0 in | Wt 192.4 lb

## 2018-04-17 DIAGNOSIS — I480 Paroxysmal atrial fibrillation: Secondary | ICD-10-CM

## 2018-04-17 DIAGNOSIS — Z7901 Long term (current) use of anticoagulants: Secondary | ICD-10-CM

## 2018-04-17 DIAGNOSIS — I951 Orthostatic hypotension: Secondary | ICD-10-CM | POA: Diagnosis not present

## 2018-04-17 DIAGNOSIS — I25810 Atherosclerosis of coronary artery bypass graft(s) without angina pectoris: Secondary | ICD-10-CM | POA: Diagnosis not present

## 2018-04-17 HISTORY — DX: Long term (current) use of anticoagulants: Z79.01

## 2018-04-17 NOTE — Patient Instructions (Addendum)
Medication Instructions:  Your physician recommends that you continue on your current medications as directed. Please refer to the Current Medication list given to you today.   Labwork: None  Testing/Procedures: None  Follow-Up: Your physician wants you to follow-up in:3 months.   You will receive a reminder letter in the mail two months in advance. If you don't receive a letter, please call our office to schedule the follow-up appointment.   Any Other Special Instructions Will Be Listed Below (If Applicable).     If you need a refill on your cardiac medications before your next appointment, please call your pharmacy.    KardiaMobile [email or web address]        FDA-cleared, clinical grade mobile EKG monitor: Kardia is the most clinically-validated mobile EKG used by the world's leading cardiac care medical professionals With Basic service, know instantly if your heart rhythm is normal or if atrial fibrillation is detected, and email the last single EKG recording to yourself or your doctor Premium service, available for purchase through the Kardia app for $9.99 per month or $99 per year, includes unlimited history and storage of your EKG recordings, a monthly EKG summary report to share with your doctor, along with the ability to track your blood pressure, activity and weight Includes one KardiaMobile phone clip FREE SHIPPING: Standard delivery 1-3 business days. Orders placed by 11:00am PST will ship that afternoon. Otherwise, will ship next business day. All orders ship via USPS Priority Mail from [place], [place]     

## 2018-04-19 DIAGNOSIS — N400 Enlarged prostate without lower urinary tract symptoms: Secondary | ICD-10-CM | POA: Diagnosis not present

## 2018-04-19 DIAGNOSIS — M80062D Age-related osteoporosis with current pathological fracture, left lower leg, subsequent encounter for fracture with routine healing: Secondary | ICD-10-CM | POA: Diagnosis not present

## 2018-04-19 DIAGNOSIS — I10 Essential (primary) hypertension: Secondary | ICD-10-CM | POA: Diagnosis not present

## 2018-04-19 DIAGNOSIS — I2510 Atherosclerotic heart disease of native coronary artery without angina pectoris: Secondary | ICD-10-CM | POA: Diagnosis not present

## 2018-04-19 DIAGNOSIS — I9589 Other hypotension: Secondary | ICD-10-CM | POA: Diagnosis not present

## 2018-04-19 DIAGNOSIS — I482 Chronic atrial fibrillation: Secondary | ICD-10-CM | POA: Diagnosis not present

## 2018-04-23 DIAGNOSIS — I482 Chronic atrial fibrillation: Secondary | ICD-10-CM | POA: Diagnosis not present

## 2018-04-23 DIAGNOSIS — I9589 Other hypotension: Secondary | ICD-10-CM | POA: Diagnosis not present

## 2018-04-23 DIAGNOSIS — M80062D Age-related osteoporosis with current pathological fracture, left lower leg, subsequent encounter for fracture with routine healing: Secondary | ICD-10-CM | POA: Diagnosis not present

## 2018-04-23 DIAGNOSIS — I10 Essential (primary) hypertension: Secondary | ICD-10-CM | POA: Diagnosis not present

## 2018-04-23 DIAGNOSIS — I2510 Atherosclerotic heart disease of native coronary artery without angina pectoris: Secondary | ICD-10-CM | POA: Diagnosis not present

## 2018-04-23 DIAGNOSIS — N400 Enlarged prostate without lower urinary tract symptoms: Secondary | ICD-10-CM | POA: Diagnosis not present

## 2018-04-24 DIAGNOSIS — S82852D Displaced trimalleolar fracture of left lower leg, subsequent encounter for closed fracture with routine healing: Secondary | ICD-10-CM | POA: Diagnosis not present

## 2018-04-24 DIAGNOSIS — Z23 Encounter for immunization: Secondary | ICD-10-CM | POA: Diagnosis not present

## 2018-04-25 DIAGNOSIS — I9589 Other hypotension: Secondary | ICD-10-CM | POA: Diagnosis not present

## 2018-04-25 DIAGNOSIS — M80062D Age-related osteoporosis with current pathological fracture, left lower leg, subsequent encounter for fracture with routine healing: Secondary | ICD-10-CM | POA: Diagnosis not present

## 2018-04-25 DIAGNOSIS — N400 Enlarged prostate without lower urinary tract symptoms: Secondary | ICD-10-CM | POA: Diagnosis not present

## 2018-04-25 DIAGNOSIS — I10 Essential (primary) hypertension: Secondary | ICD-10-CM | POA: Diagnosis not present

## 2018-04-25 DIAGNOSIS — I2510 Atherosclerotic heart disease of native coronary artery without angina pectoris: Secondary | ICD-10-CM | POA: Diagnosis not present

## 2018-04-25 DIAGNOSIS — I482 Chronic atrial fibrillation: Secondary | ICD-10-CM | POA: Diagnosis not present

## 2018-04-27 ENCOUNTER — Ambulatory Visit: Payer: Medicare Other | Admitting: Neurology

## 2018-05-01 DIAGNOSIS — I9589 Other hypotension: Secondary | ICD-10-CM | POA: Diagnosis not present

## 2018-05-01 DIAGNOSIS — N400 Enlarged prostate without lower urinary tract symptoms: Secondary | ICD-10-CM | POA: Diagnosis not present

## 2018-05-01 DIAGNOSIS — M80062D Age-related osteoporosis with current pathological fracture, left lower leg, subsequent encounter for fracture with routine healing: Secondary | ICD-10-CM | POA: Diagnosis not present

## 2018-05-01 DIAGNOSIS — I10 Essential (primary) hypertension: Secondary | ICD-10-CM | POA: Diagnosis not present

## 2018-05-01 DIAGNOSIS — I2510 Atherosclerotic heart disease of native coronary artery without angina pectoris: Secondary | ICD-10-CM | POA: Diagnosis not present

## 2018-05-01 DIAGNOSIS — I482 Chronic atrial fibrillation: Secondary | ICD-10-CM | POA: Diagnosis not present

## 2018-05-03 DIAGNOSIS — I10 Essential (primary) hypertension: Secondary | ICD-10-CM | POA: Diagnosis not present

## 2018-05-03 DIAGNOSIS — N400 Enlarged prostate without lower urinary tract symptoms: Secondary | ICD-10-CM | POA: Diagnosis not present

## 2018-05-03 DIAGNOSIS — M80062D Age-related osteoporosis with current pathological fracture, left lower leg, subsequent encounter for fracture with routine healing: Secondary | ICD-10-CM | POA: Diagnosis not present

## 2018-05-03 DIAGNOSIS — I9589 Other hypotension: Secondary | ICD-10-CM | POA: Diagnosis not present

## 2018-05-03 DIAGNOSIS — I482 Chronic atrial fibrillation: Secondary | ICD-10-CM | POA: Diagnosis not present

## 2018-05-03 DIAGNOSIS — I2510 Atherosclerotic heart disease of native coronary artery without angina pectoris: Secondary | ICD-10-CM | POA: Diagnosis not present

## 2018-05-06 ENCOUNTER — Other Ambulatory Visit: Payer: Self-pay | Admitting: Cardiology

## 2018-05-10 ENCOUNTER — Telehealth: Payer: Self-pay

## 2018-05-10 MED ORDER — METOPROLOL TARTRATE 25 MG PO TABS: 12.5000 mg | ORAL_TABLET | Freq: Every day | ORAL | 3 refills | Status: DC

## 2018-05-10 NOTE — Telephone Encounter (Signed)
Patients wife Irving Burton notified of results of monitor with slow heart rate at times, and metoprolol should be reduced to 0.5 tablet (12.5mg ) once daily.  Patients wife verbalized understanding.

## 2018-05-10 NOTE — Telephone Encounter (Signed)
-----   Message from Baldo Daub, MD sent at 05/10/2018  1:04 PM EDT ----- Normal or stable result  Monitotr is good but rate is slow at times, reduce metoprolol to once daily 12.5 mg

## 2018-05-11 DIAGNOSIS — I10 Essential (primary) hypertension: Secondary | ICD-10-CM | POA: Diagnosis not present

## 2018-05-11 DIAGNOSIS — S82852S Displaced trimalleolar fracture of left lower leg, sequela: Secondary | ICD-10-CM | POA: Diagnosis not present

## 2018-05-11 DIAGNOSIS — I25810 Atherosclerosis of coronary artery bypass graft(s) without angina pectoris: Secondary | ICD-10-CM | POA: Diagnosis not present

## 2018-05-11 DIAGNOSIS — I951 Orthostatic hypotension: Secondary | ICD-10-CM | POA: Diagnosis not present

## 2018-06-10 ENCOUNTER — Other Ambulatory Visit: Payer: Self-pay | Admitting: Cardiology

## 2018-07-25 ENCOUNTER — Encounter (HOSPITAL_COMMUNITY): Payer: Medicare Other

## 2018-07-25 ENCOUNTER — Ambulatory Visit: Payer: Medicare Other | Admitting: Vascular Surgery

## 2018-07-25 DIAGNOSIS — R351 Nocturia: Secondary | ICD-10-CM | POA: Diagnosis not present

## 2018-07-25 DIAGNOSIS — N401 Enlarged prostate with lower urinary tract symptoms: Secondary | ICD-10-CM | POA: Diagnosis not present

## 2018-08-08 ENCOUNTER — Telehealth: Payer: Self-pay | Admitting: *Deleted

## 2018-08-08 MED ORDER — APIXABAN 5 MG PO TABS: 5.0000 mg | ORAL_TABLET | Freq: Two times a day (BID) | ORAL | 0 refills | Status: DC

## 2018-08-08 NOTE — Telephone Encounter (Signed)
*  STAT* If patient is at the pharmacy, call can be transferred to refill team.   1. Which medications need to be refilled? (please list name of each medication and dose if known) Eliquis 5 mg bid 2. Which pharmacy/location (including street and city if local pharmacy) is medication to be sent to?[facility] on [address]  3. Do they need a 30 day or 90 day supply? 30 day supply

## 2018-08-13 DIAGNOSIS — Z7901 Long term (current) use of anticoagulants: Secondary | ICD-10-CM | POA: Diagnosis not present

## 2018-08-13 DIAGNOSIS — K222 Esophageal obstruction: Secondary | ICD-10-CM | POA: Diagnosis not present

## 2018-08-30 DIAGNOSIS — Z7901 Long term (current) use of anticoagulants: Secondary | ICD-10-CM | POA: Diagnosis not present

## 2018-08-30 DIAGNOSIS — I2510 Atherosclerotic heart disease of native coronary artery without angina pectoris: Secondary | ICD-10-CM | POA: Diagnosis not present

## 2018-08-30 DIAGNOSIS — Z980 Intestinal bypass and anastomosis status: Secondary | ICD-10-CM | POA: Diagnosis not present

## 2018-08-30 DIAGNOSIS — R1310 Dysphagia, unspecified: Secondary | ICD-10-CM | POA: Diagnosis not present

## 2018-08-30 DIAGNOSIS — Z8673 Personal history of transient ischemic attack (TIA), and cerebral infarction without residual deficits: Secondary | ICD-10-CM | POA: Diagnosis not present

## 2018-08-30 DIAGNOSIS — Z955 Presence of coronary angioplasty implant and graft: Secondary | ICD-10-CM | POA: Diagnosis not present

## 2018-08-30 DIAGNOSIS — K449 Diaphragmatic hernia without obstruction or gangrene: Secondary | ICD-10-CM | POA: Diagnosis not present

## 2018-08-30 DIAGNOSIS — K222 Esophageal obstruction: Secondary | ICD-10-CM | POA: Diagnosis not present

## 2018-08-30 DIAGNOSIS — Z951 Presence of aortocoronary bypass graft: Secondary | ICD-10-CM | POA: Diagnosis not present

## 2018-08-30 DIAGNOSIS — I10 Essential (primary) hypertension: Secondary | ICD-10-CM | POA: Diagnosis not present

## 2018-08-30 DIAGNOSIS — K2970 Gastritis, unspecified, without bleeding: Secondary | ICD-10-CM | POA: Diagnosis not present

## 2018-08-30 DIAGNOSIS — I6523 Occlusion and stenosis of bilateral carotid arteries: Secondary | ICD-10-CM | POA: Diagnosis not present

## 2018-08-30 DIAGNOSIS — Z79899 Other long term (current) drug therapy: Secondary | ICD-10-CM | POA: Diagnosis not present

## 2018-08-30 DIAGNOSIS — Z800 Family history of malignant neoplasm of digestive organs: Secondary | ICD-10-CM | POA: Diagnosis not present

## 2018-08-30 DIAGNOSIS — Z7951 Long term (current) use of inhaled steroids: Secondary | ICD-10-CM | POA: Diagnosis not present

## 2018-09-02 IMAGING — MR MR MRA HEAD W/O CM
7 series · 35 of 48 positions shown · non-contrast
Comparison: Prior CT from earlier the same day.

CLINICAL DATA: Initial evaluation for left-sided numbness and
tingling.

EXAM:
MRI HEAD WITHOUT CONTRAST
MRA HEAD WITHOUT CONTRAST
TECHNIQUE: Multiplanar, multiecho pulse sequences of the brain and surrounding
structures were obtained without intravenous contrast. Angiographic
images of the head were obtained using MRA technique without
contrast.

[Series 3: DWI · axial · 3.0mm · 0.94mm/px · z∈[-18,+128]mm · 9 of 100 slices shown (1 of 2)]
[im 1/100]
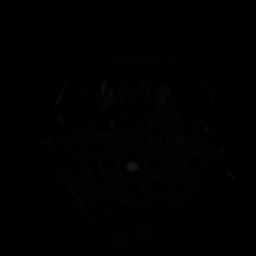
[im 13/100]
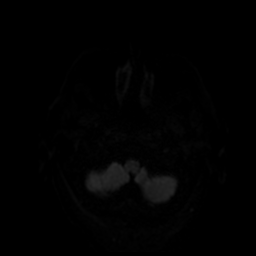
[im 25/100]
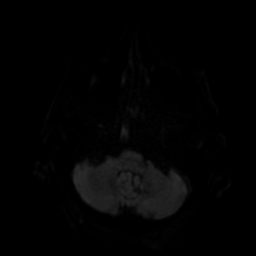
[im 38/100]
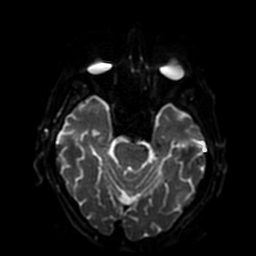
[im 50/100]
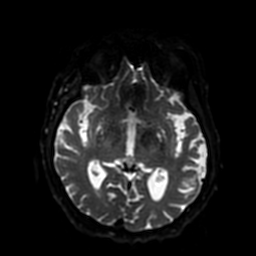
[im 62/100]
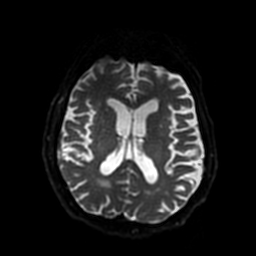
[im 75/100]
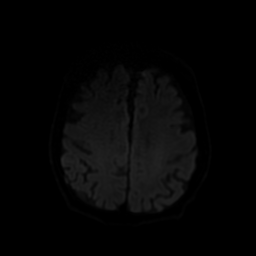
[im 87/100]
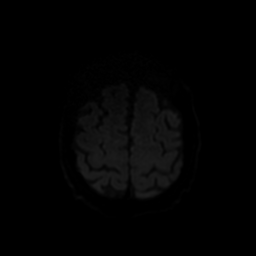
[im 100/100]
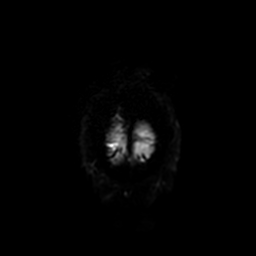

[Series 4: ax (id) 2 · axial · 1.0mm · 0.43mm/px · z∈[+22,+81]mm · 6 of 184 slices shown]
[im 11/184]
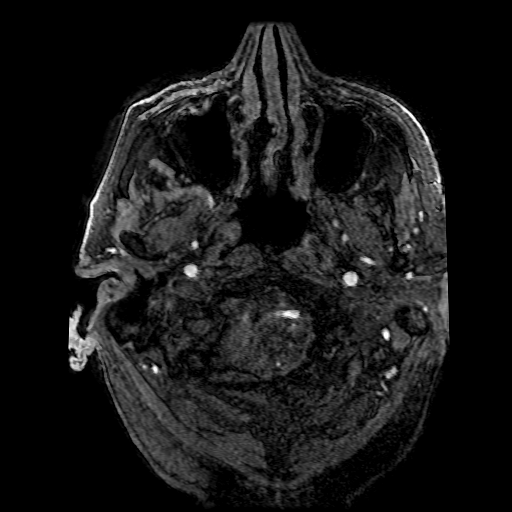
[im 31/184]
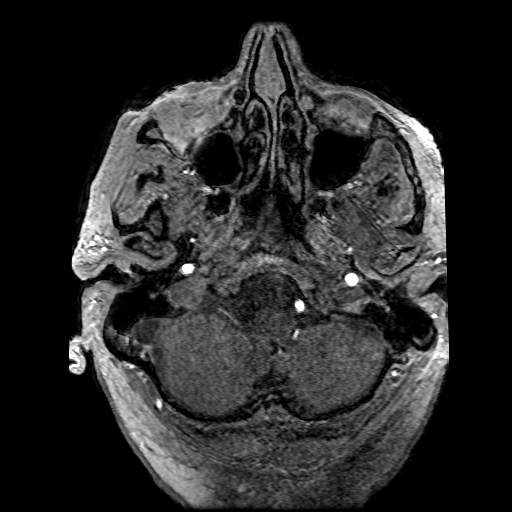
[im 51/184]
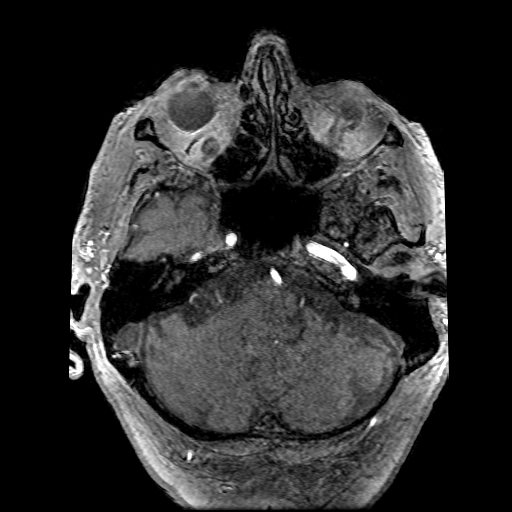
[im 82/184]
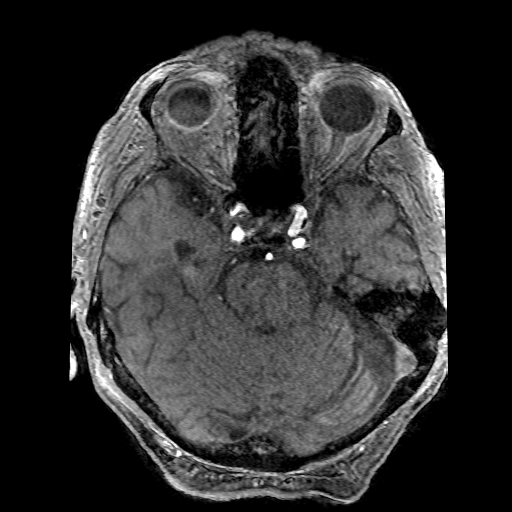
[im 102/184]
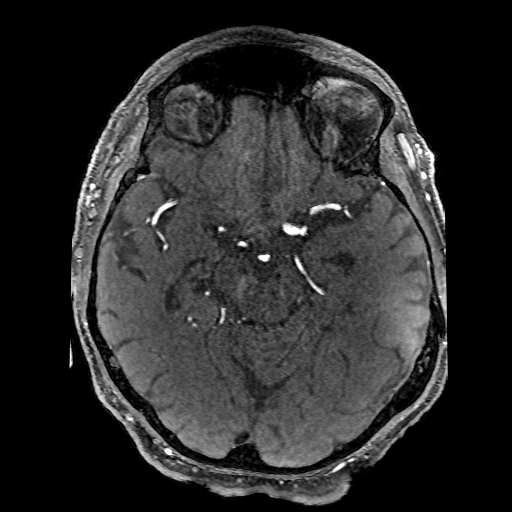
[im 133/184]
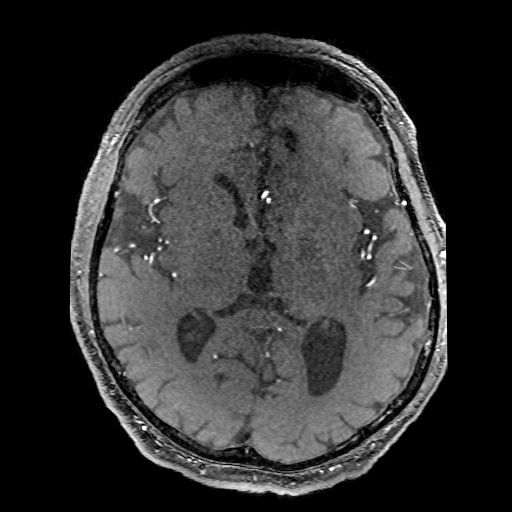

[Series 5: DWI · coronal · 4.0mm · 0.94mm/px · 7 of 72 slices shown (2 of 2)]
[im 1/72]
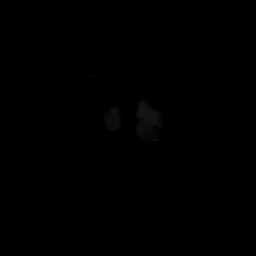
[im 12/72]
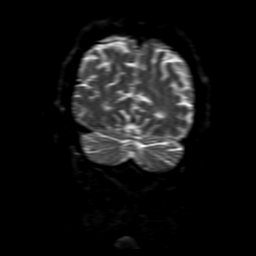
[im 24/72]
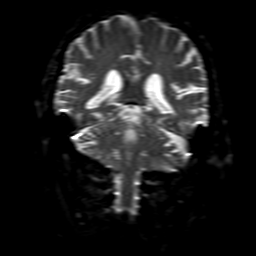
[im 36/72]
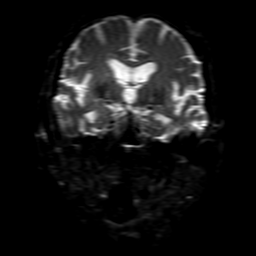
[im 48/72]
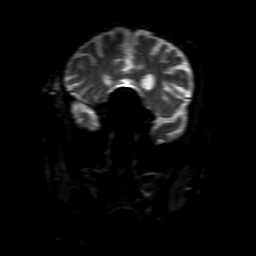
[im 60/72]
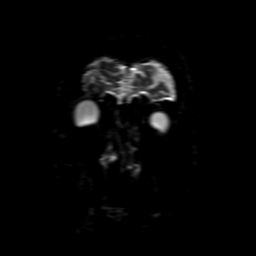
[im 72/72]
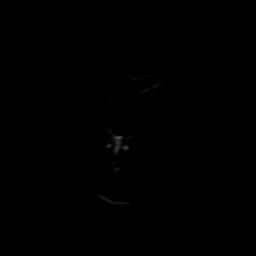

[Series 6: FLAIR · sagittal · 5.0mm · 0.47mm/px · 3 of 25 slices shown (1 of 2)]
[im 1/25]
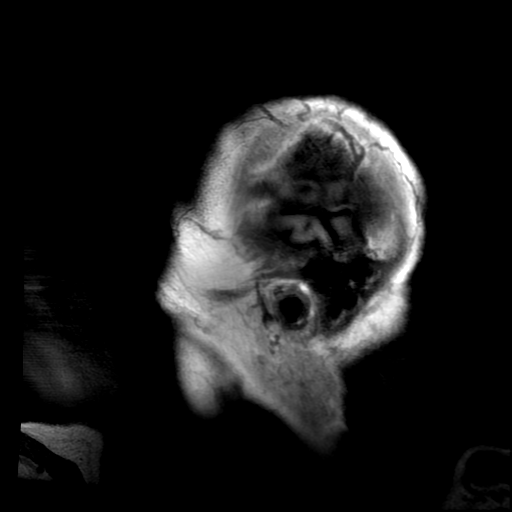
[im 13/25]
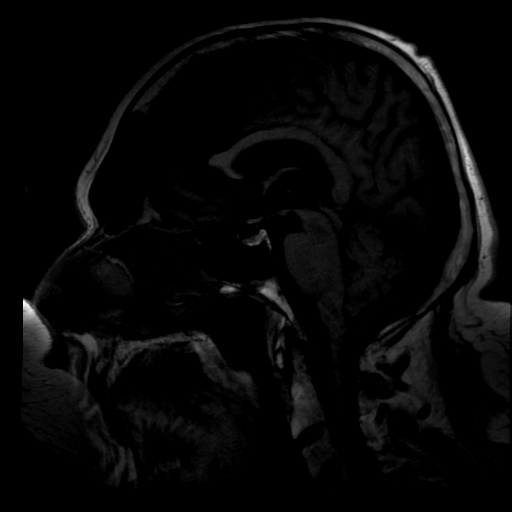
[im 25/25]
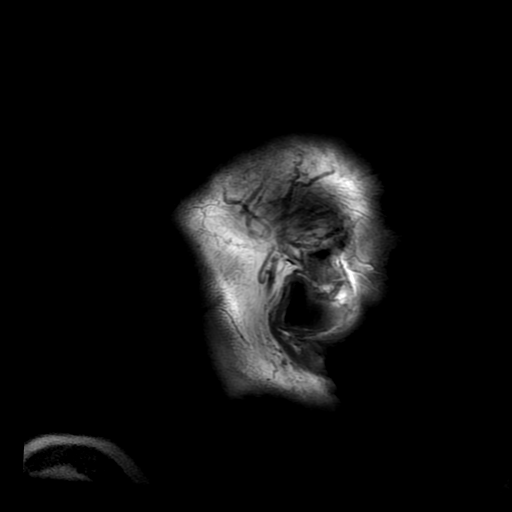

[Series 7: FLAIR · axial · 5.0mm · 0.47mm/px · 1 of 9 slices shown (2 of 2)]
[im 1/9]
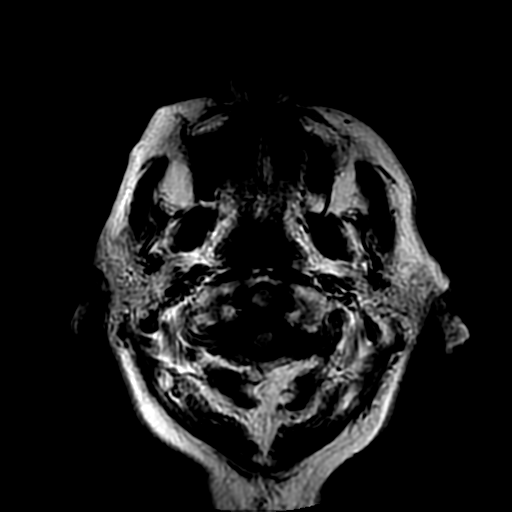

[Series 350: ADC · axial · 3.0mm · 0.94mm/px · z∈[-18,+128]mm · 5 of 50 slices shown (1 of 2)]
[im 1/50]
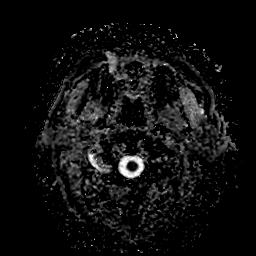
[im 13/50]
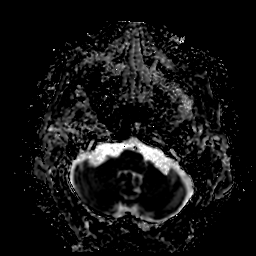
[im 25/50]
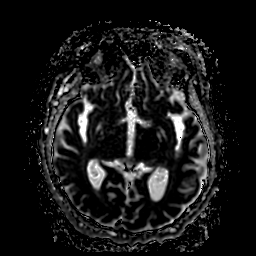
[im 37/50]
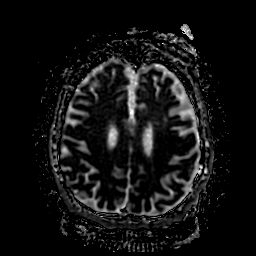
[im 50/50]
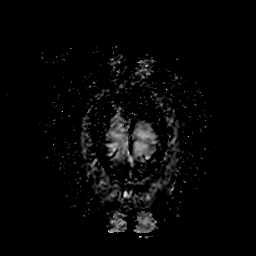

[Series 550: ADC · coronal · 4.0mm · 0.94mm/px · 4 of 36 slices shown (2 of 2)]
[im 1/36]
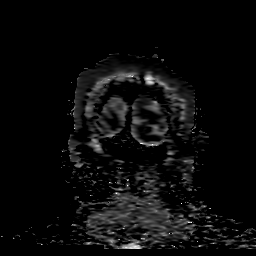
[im 12/36]
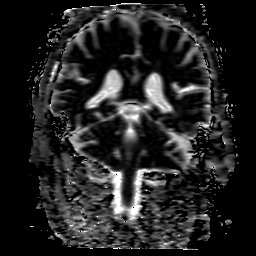
[im 24/36]
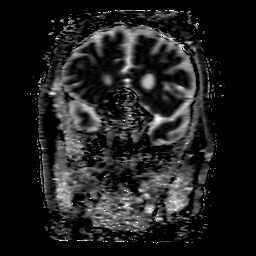
[im 36/36]
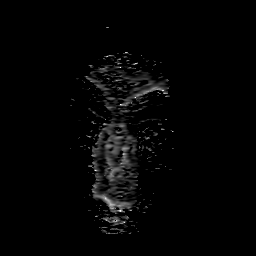

[35 of 48 positions shown; findings below may reference images not displayed]

FINDINGS: MRI HEAD FINDINGS

Brain: Study is limited as the patient was unable to tolerate the
full length of the exam. Only diffusion-weighted sequences with
sagittal T1 and limited axial FLAIR sequences were performed.

Diffuse prominence of the CSF containing spaces compatible with
generalized age-related cerebral atrophy. Minimal patchy T2/FLAIR
hyperintensity within the periventricular white matter most likely
related to chronic small vessel ischemic disease, incompletely
evaluated on this exam, but overall appears to be fairly mild in
nature.

No abnormal foci of restricted diffusion to suggest acute or
subacute ischemia. Gray-white matter differentiation maintained. No
definite areas of chronic infarction identified.

No appreciable mass lesion or mass-effect on this limited exam. No
hydrocephalus. No extra-axial fluid collection.

Pituitary gland suprasellar region within normal limits. Midline
structures intact.

Vascular: Not well evaluated on this limited exam.

Skull and upper cervical spine: Craniocervical junction within
normal limits. Visualized upper cervical spine unremarkable. Bone
marrow signal intensity within normal limits. No scalp soft tissue
abnormality.

Sinuses/Orbits: Globes and orbital soft tissues grossly
unremarkable. Paranasal sinuses grossly clear. No definite mastoid
effusion.

MRA HEAD FINDINGS

ANTERIOR CIRCULATION:

Study degraded by motion artifact.

Distal cervical segments of the internal carotid artery is are
patent with antegrade flow. Petrous, cavernous, and supraclinoid
segments of the internal carotid artery's widely patent without
flow-limiting stenosis. ICA termini widely patent. A1 segments
patent bilaterally. Anterior communicating artery normal. Anterior
cerebral arteries patent to their distal aspects without
flow-limiting stenosis. M1 segments patent without stenosis or
occlusion. MCA bifurcations within normal limits. No proximal M2
occlusion. Distal MCA branches well opacified and symmetric.

POSTERIOR CIRCULATION:

Dominant right vertebral artery widely patent to the vertebrobasilar
junction. Left vertebral artery not well visualize, suspected to
terminate in PICA. Partially visualized posterior inferior cerebral
arteries patent. Basilar artery widely patent to its distal aspect.
Superior cerebral arteries patent bilaterally. Right PCA supplied
via the basilar and is widely patent to its distal aspect. Fetal
type origin of the left PCA supplied via a widely patent left
posterior communicating artery. Left PCA widely patent as well.

No aneurysm or vascular malformation.
IMPRESSION: MRI HEAD IMPRESSION:

1. Limited study as the patient was unable to tolerate the full
length of the exam.
2. No acute intracranial infarct identified. No other definite acute
intracranial process identified on this limited exam.
3. Mild for age chronic microvascular ischemic disease.

MRA HEAD IMPRESSION:

Negative intracranial MRA. No large vessel occlusion. No high-grade
or correctable stenosis. No significant atheromatous disease for
patient age.

## 2018-09-02 IMAGING — CT CT HEAD W/O CM
3 series · 15 of 47 positions shown, 18 images · non-contrast
Comparison: 12/03/2015

CLINICAL DATA: Left-sided facial numbness of sudden onset today.

EXAM:
CT HEAD WITHOUT CONTRAST
TECHNIQUE: Contiguous axial images were obtained from the base of the skull
through the vertex without intravenous contrast.

[Series 3: head 5.0 h30s · axial · 0.40mm/px · z∈[+65,+215]mm · 9 of 36 slices shown, 12 images]
[im 3/36  brain]
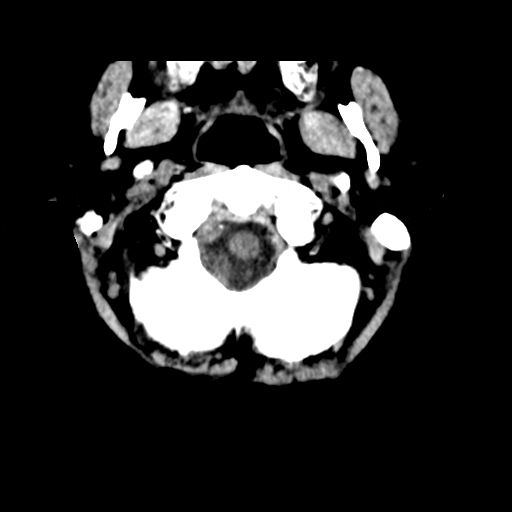
[im 3/36  bone]
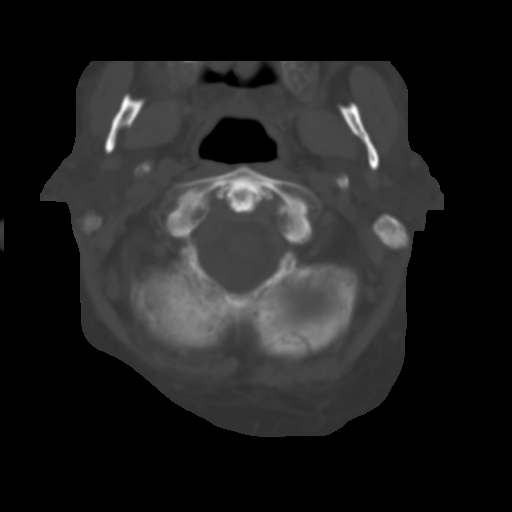
[im 7/36  brain]
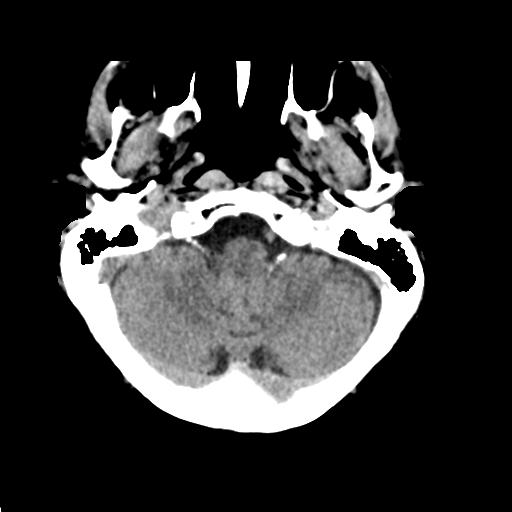
[im 10/36  brain]
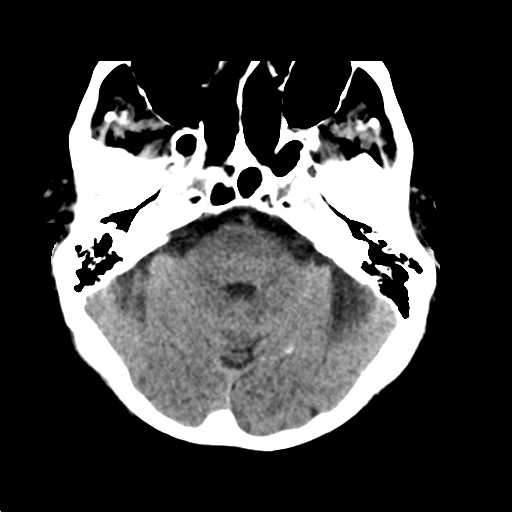
[im 14/36  brain]
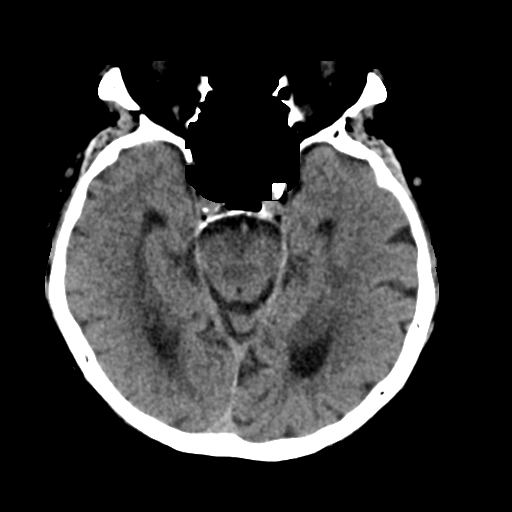
[im 19/36  brain]
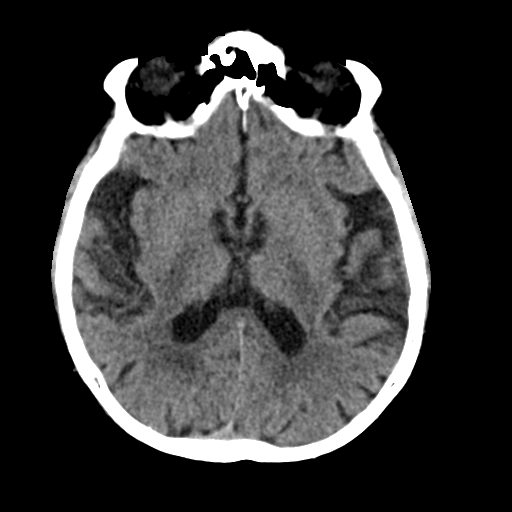
[im 19/36  bone]
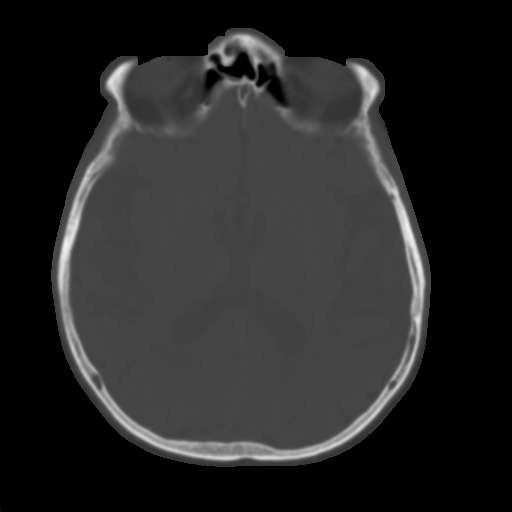
[im 22/36  brain]
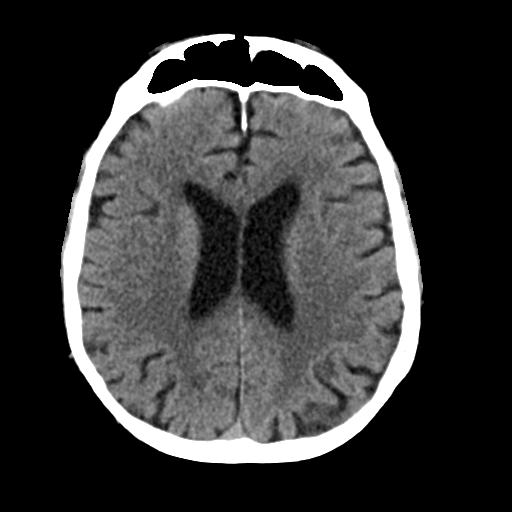
[im 26/36  brain]
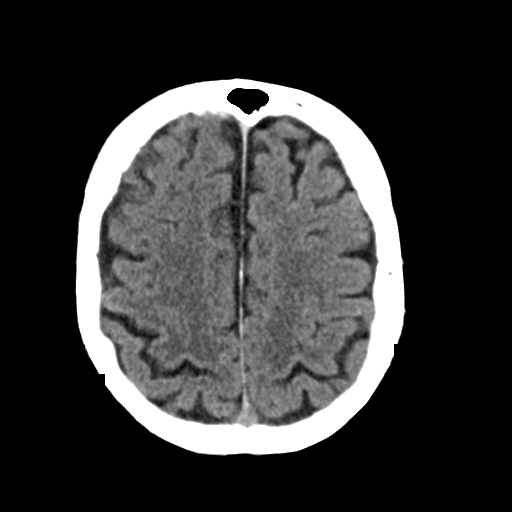
[im 29/36  brain]
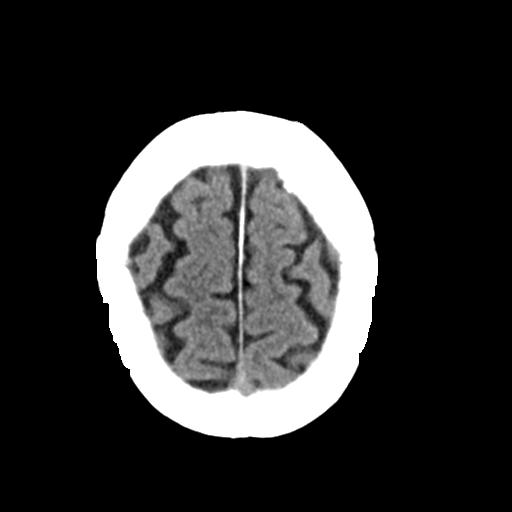
[im 33/36  brain]
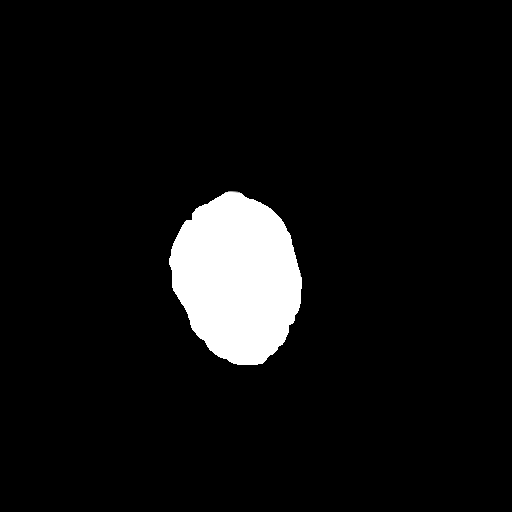
[im 33/36  bone]
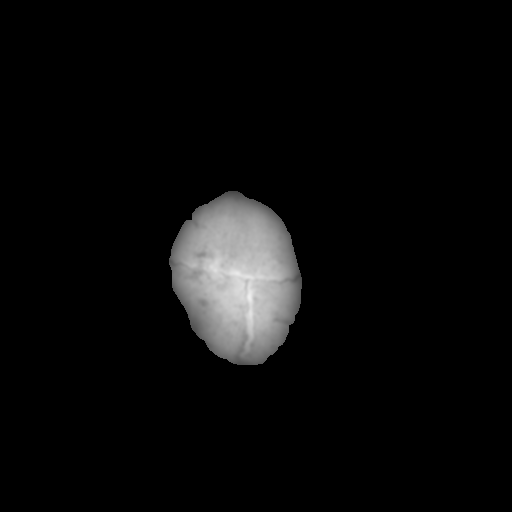

[Series 5: head 3.0 mpr cor · coronal · 0.32mm/px · 3 of 71 slices shown]
[im 26/71  brain]
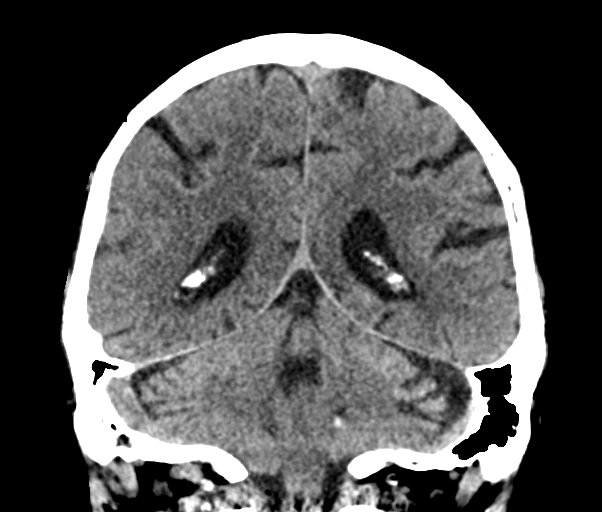
[im 32/71  brain]
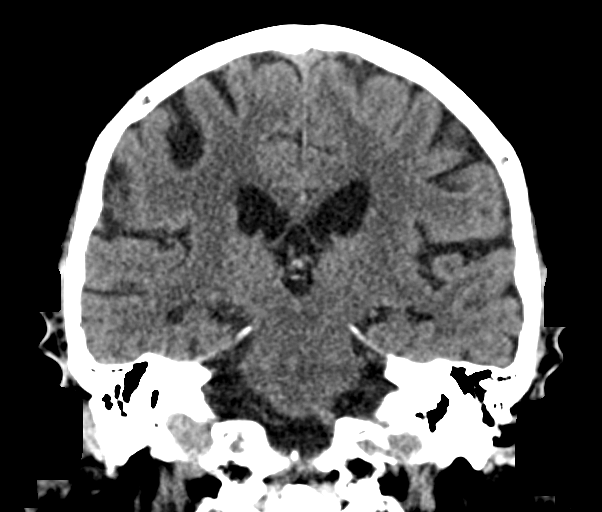
[im 39/71  brain]
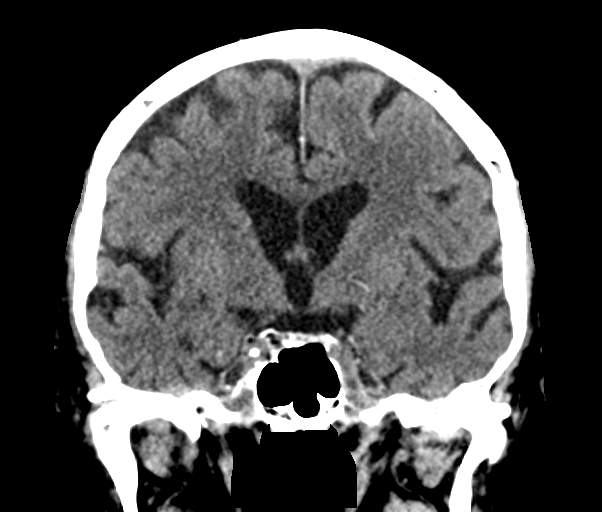

[Series 6: head 3.0 mpr sag · sagittal · 0.34mm/px · 3 of 67 slices shown]
[im 23/67  brain]
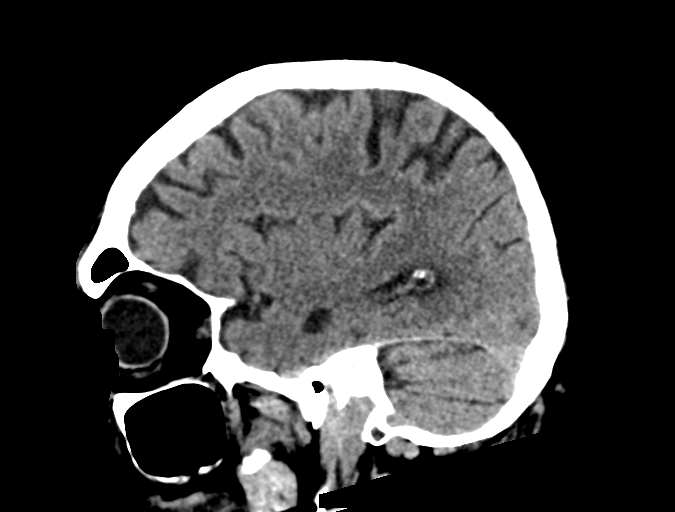
[im 34/67  brain]
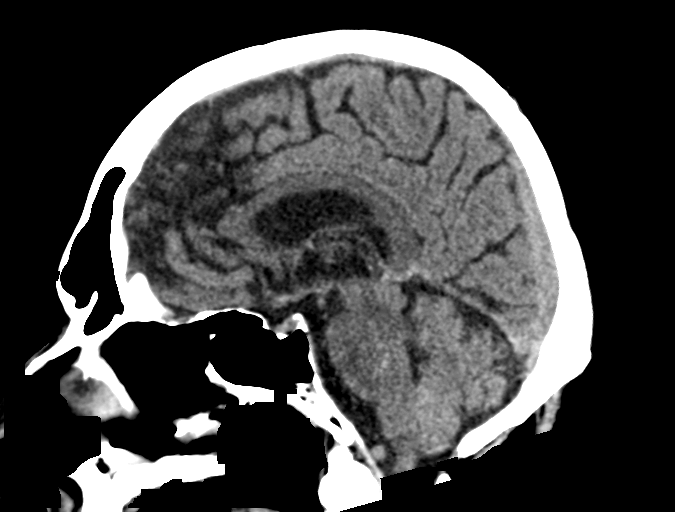
[im 45/67  brain]
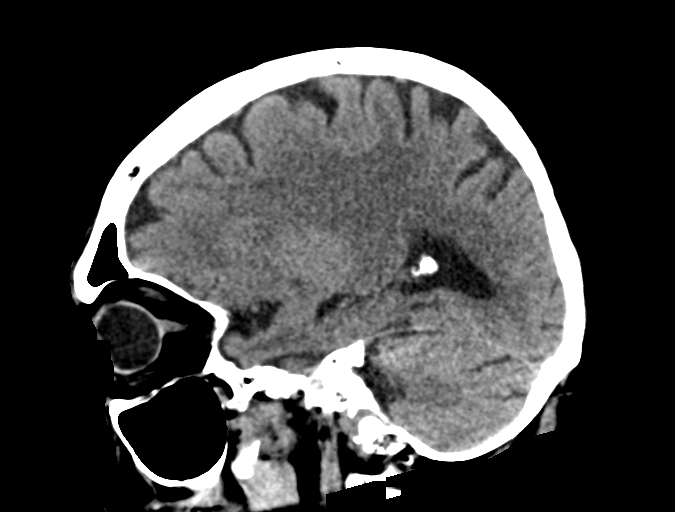

[15 of 47 positions shown; findings below may reference images not displayed]

FINDINGS: Brain: Mild age related involutional changes of the brain. Chronic
appearing bilateral basal ganglial lacune or infarcts. Minimal small
vessel ischemic disease of periventricular white matter. No large
vascular territory infarct. No intracranial hemorrhage, intra-axial
mass nor extra-axial fluid. No hydrocephalus

Vascular: Mild atherosclerosis of the carotid siphons.

Skull: Negative for fracture or focal lesions.

Sinuses/Orbits: Clear bilateral mastoids and paranasal sinuses.
Intact orbits and globes with bilateral lens replacements.

Other: None
IMPRESSION: 1. Minimal chronic small vessel ischemic disease of periventricular
white matter.
2. Chronic appearing bilateral small basal ganglial lacunar
infarcts.
3. No acute intracranial abnormality.

## 2018-09-04 ENCOUNTER — Telehealth: Payer: Self-pay | Admitting: Cardiology

## 2018-09-04 MED ORDER — APIXABAN 5 MG PO TABS: 5.0000 mg | ORAL_TABLET | Freq: Two times a day (BID) | ORAL | 0 refills | Status: DC

## 2018-09-04 NOTE — Telephone Encounter (Signed)
Patient has appt set up for September 20, 2018 and needs script until that date  1. Which medications need to be refilled? (please list name of each medication and dose if known) Eloquis  2. Which pharmacy/location (including street and city if local pharmacy) is medication to be sent to?[facility] [address] in [place]  3. Do they need a 30 day or 90 day supply? 30

## 2018-09-04 NOTE — Telephone Encounter (Signed)
30 day supply of eliquis [facility] [place] as requested with no refills. Patient will receive further refills during follow up appointment on 09/20/2018.

## 2018-09-06 MED ORDER — APIXABAN 5 MG PO TABS: 5.0000 mg | ORAL_TABLET | Freq: Two times a day (BID) | ORAL | 0 refills | Status: DC

## 2018-09-06 NOTE — Telephone Encounter (Signed)
Refill resent to correct pharmacy

## 2018-09-06 NOTE — Telephone Encounter (Signed)
Patient called back and please recall meds to the [facility] on [address].. Not [facility]..

## 2018-09-06 NOTE — Addendum Note (Signed)
Addended by: Crist Fat on: 09/06/2018 02:20 PM   Modules accepted: Orders

## 2018-09-20 ENCOUNTER — Ambulatory Visit (INDEPENDENT_AMBULATORY_CARE_PROVIDER_SITE_OTHER): Payer: Medicare Other | Admitting: Cardiology

## 2018-09-20 ENCOUNTER — Encounter: Payer: Self-pay | Admitting: Cardiology

## 2018-09-20 VITALS — BP 114/74 | HR 95 | Ht 74.0 in | Wt 198.8 lb

## 2018-09-20 DIAGNOSIS — I10 Essential (primary) hypertension: Secondary | ICD-10-CM

## 2018-09-20 DIAGNOSIS — I951 Orthostatic hypotension: Secondary | ICD-10-CM

## 2018-09-20 DIAGNOSIS — I25810 Atherosclerosis of coronary artery bypass graft(s) without angina pectoris: Secondary | ICD-10-CM | POA: Diagnosis not present

## 2018-09-20 DIAGNOSIS — I480 Paroxysmal atrial fibrillation: Secondary | ICD-10-CM

## 2018-09-20 DIAGNOSIS — Z7901 Long term (current) use of anticoagulants: Secondary | ICD-10-CM

## 2018-09-20 MED ORDER — NITROGLYCERIN 0.4 MG SL SUBL: 0.4000 mg | SUBLINGUAL_TABLET | SUBLINGUAL | 6 refills | Status: DC | PRN

## 2018-09-20 MED ORDER — APIXABAN 5 MG PO TABS: 5.0000 mg | ORAL_TABLET | Freq: Two times a day (BID) | ORAL | 2 refills | Status: DC

## 2018-09-20 MED ORDER — MIDODRINE HCL 10 MG PO TABS: 10.0000 mg | ORAL_TABLET | Freq: Three times a day (TID) | ORAL | 2 refills | Status: DC

## 2018-09-20 NOTE — Progress Notes (Signed)
Cardiology Office Note:    Date:  09/20/2018   ID:  Joshua Chambers, Joshua Chambers 1923/01/19, MRN [id number]  PCP:  Street, Stephanie Coup, MD  Cardiologist:  Norman Herrlich, MD    Referring MD: [address] Stephanie Coup, *    ASSESSMENT:    1. Coronary artery disease involving coronary bypass graft of native heart without angina pectoris; CAD (coronary artery disease) of artery bypass graft:  loss of VG to RCA and VG-Diag   2. Essential hypertension   3. Orthostatic hypotension   4. PAF (paroxysmal atrial fibrillation) (HCC)   5. Chronic anticoagulation    PLAN:    In order of problems listed above:  1. Stable CAD he is presently New York Heart Association class I continue his current medical treatment including his anticoagulant high intensity statin beta-blocker.  I would not advise an ischemia evaluation he is pleased with the quality of his life 2. Stable 3. Improved asymptomatic continue his ProAmatine 4. Stable maintaining sinus rhythm at high risk with TIA and continue Eliquis 5. Continue his Eliquis he has had no bleeding complications labs are being followed in his primary care physician office and he needs no dose adjustment for renal insufficiency   Next appointment: 6 months   Medication Adjustments/Labs and Tests Ordered: Current medicines are reviewed at length with the patient today.  Concerns regarding medicines are outlined above.  No orders of the defined types were placed in this encounter.  Meds ordered this encounter  Medications  . midodrine (PROAMATINE) 10 MG tablet    Sig: Take 1 tablet (10 mg total) by mouth 3 (three) times daily.    Dispense:  90 tablet    Refill:  2  . nitroGLYCERIN (NITROSTAT) 0.4 MG SL tablet    Sig: Place 1 tablet (0.4 mg total) under the tongue every 5 (five) minutes as needed for chest pain.    Dispense:  25 tablet    Refill:  6  . apixaban (ELIQUIS) 5 MG TABS tablet    Sig: Take 1 tablet (5 mg total) by mouth 2 (two) times daily.   Dispense:  180 tablet    Refill:  2    Patient will receive further refills during scheduled follow up appointment on 09/20/2018. Thanks!    Chief Complaint  Patient presents with  . Follow-up  . Atrial Fibrillation  . Anticoagulation  . Hypotension    History of Present Illness:    Joshua Chambers is a 85 y.o. male with a hx of PAF TIA CAD CABG in 2007,  hypertension, orthostatic hypotension, APC's last seen 05/05/18 after a fall and ankle fracture with ORIF. Compliance with diet, lifestyle and medications: Yes  He is improved he is very compliant with medications has had no further orthostatic hypotension has had no edema shortness of breath chest pain palpitation or syncope.  He relates recent endoscopy esophageal dilation with transient interruption of his anticoagulant he is back on it.  He is no longer having swallowing dysfunction.  EKG today shows tremor but he has underlying sinus rhythm no ischemic changes Past Medical History:  Diagnosis Date  . Arteriosclerosis of arterial coronary artery bypass graft   . Arthritis   . Atrial contractions, premature   . Atrial fibrillation (HCC)   . Benign hypertension   . Bilateral carotid artery stenosis   . CAD (coronary artery disease), autologous vein bypass graft 04/19/2013   100% flush occlusion of SVG-OM & SVG-Diag; near Subtotal Occlusion of SVG-RCA (PCI of RCA  done to avoid distal embolization with PCI to SVG)  . CAD (coronary artery disease), native coronary artery 2007   Multivessel CAD referred for CABG  . CAD S/P percutaneous coronary angioplasty  04/19/2013   PCI of Native RCA - Promus Premier DES 3.0 mm x 38 mm (3.6 mm);   [name]. Dyslipidemia   . Dyslipidemia, goal LDL below 70   . GERD (gastroesophageal reflux disease)   . History of DVT of lower extremity    Right popliteal vein  . History of stroke August 2011   Possible TIA in 2001;  Echo: EF 45-50%, mild HK of anterior septum.  [name]. History of transient cerebral ischemia     . Hx of orthostatic hypotension 2009   Positive tilt table:  dizziness/vasopressor type; previously on the decision   . Hx SBO   . Nephrolithiasis   . Orthostatic hypotension   . S/P CABG x 3 2007   LIMA-LAD, SVG-DIAG, SVG-RCA  . Stroke (HCC)   . Unsteady gait     Past Surgical History:  Procedure Laterality Date  . APPENDECTOMY    . CARDIAC SURGERY  2000s  . COLON SURGERY    . CORONARY ANGIOPLASTY WITH STENT PLACEMENT  04/19/13   Stent-Promus DES (3.0 mm x 38 mm - 3.6 mm) to native RCA prox-mid,  . CORONARY ARTERY BYPASS GRAFT  2007   LIMA-LAD, SVG-D1, SVG-RCA  . LEFT HEART CATHETERIZATION WITH CORONARY/GRAFT ANGIOGRAM Left 04/19/2013   LEFT HEART CATH W/ CORONARY/GRAFT ANGIOGRAM;  Marykay Lexavid W Harding, MD;  [facility] C[facility];  LM 20%-->LAD 70-80% then subTO after D1 (Patent LIMA-LAD); Small non-dom Cx ~20% ostial.  RCA long 60-70% prox --> 90% mid.  SVG-OM & SVG-Diag 100%, diffuse Subtotal occlusion of SVG-rPD  . STENT PLACEMENT VASCULAR ([facility] HX)    . TONSILLECTOMY      Current Medications: Current Meds  Medication Sig  . acetaminophen (TYLENOL) 500 MG tablet Take 1,000 mg by mouth at bedtime.  [name]. apixaban (ELIQUIS) 5 MG TABS tablet Take 1 tablet (5 mg total) by mouth 2 (two) times daily.  [name]. atorvastatin (LIPITOR) 40 MG tablet TAKE 1 TABLET BY MOUTH DAILY AT 6:00 PM (Patient taking differently: TAKE 0.5 TABLET BY MOUTH DAILY AT 6:00 PM)  . dutasteride (AVODART) 0.5 MG capsule Take 0.5 mg by mouth daily.   . metoprolol tartrate (LOPRESSOR) 25 MG tablet Take 0.5 tablets (12.5 mg total) by mouth daily.  . midodrine (PROAMATINE) 10 MG tablet Take 1 tablet (10 mg total) by mouth 3 (three) times daily.  . nitroGLYCERIN (NITROSTAT) 0.4 MG SL tablet Place 1 tablet (0.4 mg total) under the tongue every 5 (five) minutes as needed for chest pain.  . Polyvinyl Alcohol-Povidone (REFRESH OP) Place 1 drop into both eyes daily as needed.  [name]. PULMICORT FLEXHALER 180 MCG/ACT inhaler Inhale 2 puffs  into the lungs daily.   . [DISCONTINUED] apixaban (ELIQUIS) 5 MG TABS tablet Take 1 tablet (5 mg total) by mouth 2 (two) times daily.  . [DISCONTINUED] midodrine (PROAMATINE) 10 MG tablet TAKE 1 TABLET(10 MG) BY MOUTH THREE TIMES DAILY WITH MEALS  . [DISCONTINUED] nitroGLYCERIN (NITROSTAT) 0.4 MG SL tablet Place 1 tablet (0.4 mg total) under the tongue every 5 (five) minutes as needed for chest pain.     Allergies:   Amiodarone; Diltiazem; Statins; and Sulfa antibiotics   Social History   Socioeconomic History  . Marital status: Married    Spouse name: Not on file  . Number of children: Not  on file  . Years of education: Not on file  . Highest education level: Not on file  Occupational History  . Not on file  Social Needs  . Financial resource strain: Not on file  . Food insecurity:    Worry: Not on file    Inability: Not on file  . Transportation needs:    Medical: Not on file    Non-medical: Not on file  Tobacco Use  . Smoking status: Never Smoker  . Smokeless tobacco: Never Used  Substance and Sexual Activity  . Alcohol use: No  . Drug use: No  . Sexual activity: Not on file  Lifestyle  . Physical activity:    Days per week: Not on file    Minutes per session: Not on file  . Stress: Not on file  Relationships  . Social connections:    Talks on phone: Not on file    Gets together: Not on file    Attends religious service: Not on file    Active member of club or organization: Not on file    Attends meetings of clubs or organizations: Not on file    Relationship status: Not on file  Other Topics Concern  . Not on file  Social History Narrative    He is now remarried for the last 2 years, his first wife of 60 years is deceased. He has 1   child from that first marriage and 2 grandchildren.    He does not smoke, does not drink.    He is not very active but he does kind of meander around with his new wife who is permanently disabled.      Family History: The  patient's family history includes Colon cancer in his father; Hypertension in his son. ROS:   Please see the history of present illness.    All other systems reviewed and are negative.  EKGs/Labs/Other Studies Reviewed:    The following studies were reviewed today:  EKG:  EKG ordered today and personally reviewed.  The ekg ordered today demonstrates tremor sinus rhythm no ischemic changes  Recent Labs: No results found for requested labs within last 8760 hours.  Recent Lipid Panel    Component Value Date/Time   CHOL 172 03/17/2017 0006   TRIG 103 03/17/2017 0006   HDL 47 03/17/2017 0006   CHOLHDL 3.7 03/17/2017 0006   VLDL 21 03/17/2017 0006   LDLCALC 104 (H) 03/17/2017 0006    Physical Exam:    VS:  BP 114/74 (BP Location: Right Arm, Patient Position: Sitting, Cuff Size: Normal)   Pulse 95   Ht 6\' 2"  (1.88 m)   Wt 198 lb 12.8 oz (90.2 kg)   SpO2 93%   BMI 25.52 kg/m     Wt Readings from Last 3 Encounters:  09/20/18 198 lb 12.8 oz (90.2 kg)  04/17/18 192 lb 6.4 oz (87.3 kg)  02/07/18 195 lb 11.2 oz (88.8 kg)     GEN:  Well nourished, well developed in no acute distress HEENT: Normal NECK: No JVD; No carotid bruits LYMPHATICS: No lymphadenopathy CARDIAC: RRR, no murmurs, rubs, gallops RESPIRATORY:  Clear to auscultation without rales, wheezing or rhonchi  ABDOMEN: Soft, non-tender, non-distended MUSCULOSKELETAL:  No edema; No deformity  SKIN: Warm and dry NEUROLOGIC:  Alert and oriented x 3 PSYCHIATRIC:  Normal affect    Signed, Norman Herrlich, MD  09/20/2018 9:49 AM    [facility]

## 2018-09-20 NOTE — Addendum Note (Signed)
Addended by: Lamona Curl on: 09/20/2018 11:16 AM   Modules accepted: Orders

## 2018-09-20 NOTE — Patient Instructions (Signed)
Medication Instructions:  Your physician recommends that you continue on your current medications as directed. Please refer to the Current Medication list given to you today.  If you need a refill on your cardiac medications before your next appointment, please call your pharmacy.   Lab work: NONE If you have labs (blood work) drawn today and your tests are completely normal, you will receive your results only by: . MyChart Message (if you have MyChart) OR . A paper copy in the mail If you have any lab test that is abnormal or we need to change your treatment, we will call you to review the results.  Testing/Procedures: You had an EKG today  Follow-Up: At [facility], you and your health needs are our priority.  As part of our continuing mission to provide you with exceptional heart care, we have created designated Provider Care Teams.  These Care Teams include your primary Cardiologist (physician) and Advanced Practice Providers (APPs -  Physician Assistants and Nurse Practitioners) who all work together to provide you with the care you need, when you need it. You will need a follow up appointment in 6 months.  Please call our office 2 months in advance to schedule this appointment.    

## 2018-10-07 ENCOUNTER — Other Ambulatory Visit: Payer: Self-pay | Admitting: Cardiology

## 2018-10-30 DIAGNOSIS — J449 Chronic obstructive pulmonary disease, unspecified: Secondary | ICD-10-CM | POA: Diagnosis not present

## 2018-10-30 DIAGNOSIS — J45909 Unspecified asthma, uncomplicated: Secondary | ICD-10-CM | POA: Diagnosis not present

## 2018-10-30 DIAGNOSIS — K644 Residual hemorrhoidal skin tags: Secondary | ICD-10-CM | POA: Diagnosis not present

## 2018-10-30 DIAGNOSIS — K5730 Diverticulosis of large intestine without perforation or abscess without bleeding: Secondary | ICD-10-CM | POA: Diagnosis not present

## 2018-10-30 DIAGNOSIS — K625 Hemorrhage of anus and rectum: Secondary | ICD-10-CM | POA: Diagnosis not present

## 2018-10-30 DIAGNOSIS — D582 Other hemoglobinopathies: Secondary | ICD-10-CM | POA: Diagnosis not present

## 2018-10-30 DIAGNOSIS — D62 Acute posthemorrhagic anemia: Secondary | ICD-10-CM | POA: Diagnosis present

## 2018-10-30 DIAGNOSIS — I10 Essential (primary) hypertension: Secondary | ICD-10-CM | POA: Diagnosis not present

## 2018-10-30 DIAGNOSIS — E785 Hyperlipidemia, unspecified: Secondary | ICD-10-CM | POA: Diagnosis present

## 2018-10-30 DIAGNOSIS — Z79899 Other long term (current) drug therapy: Secondary | ICD-10-CM | POA: Diagnosis not present

## 2018-10-30 DIAGNOSIS — K648 Other hemorrhoids: Secondary | ICD-10-CM | POA: Diagnosis present

## 2018-10-30 DIAGNOSIS — K219 Gastro-esophageal reflux disease without esophagitis: Secondary | ICD-10-CM | POA: Diagnosis present

## 2018-10-30 DIAGNOSIS — N189 Chronic kidney disease, unspecified: Secondary | ICD-10-CM | POA: Diagnosis present

## 2018-10-30 DIAGNOSIS — Z7901 Long term (current) use of anticoagulants: Secondary | ICD-10-CM | POA: Diagnosis not present

## 2018-10-30 DIAGNOSIS — R197 Diarrhea, unspecified: Secondary | ICD-10-CM | POA: Diagnosis not present

## 2018-10-30 DIAGNOSIS — I4891 Unspecified atrial fibrillation: Secondary | ICD-10-CM | POA: Diagnosis present

## 2018-10-30 DIAGNOSIS — R58 Hemorrhage, not elsewhere classified: Secondary | ICD-10-CM | POA: Diagnosis not present

## 2018-10-30 DIAGNOSIS — I2510 Atherosclerotic heart disease of native coronary artery without angina pectoris: Secondary | ICD-10-CM | POA: Diagnosis present

## 2018-10-30 DIAGNOSIS — R531 Weakness: Secondary | ICD-10-CM | POA: Diagnosis not present

## 2018-10-30 DIAGNOSIS — Z7902 Long term (current) use of antithrombotics/antiplatelets: Secondary | ICD-10-CM | POA: Diagnosis not present

## 2018-10-30 DIAGNOSIS — I129 Hypertensive chronic kidney disease with stage 1 through stage 4 chronic kidney disease, or unspecified chronic kidney disease: Secondary | ICD-10-CM | POA: Diagnosis present

## 2018-10-30 DIAGNOSIS — Z8673 Personal history of transient ischemic attack (TIA), and cerebral infarction without residual deficits: Secondary | ICD-10-CM | POA: Diagnosis not present

## 2018-10-30 DIAGNOSIS — Z951 Presence of aortocoronary bypass graft: Secondary | ICD-10-CM | POA: Diagnosis not present

## 2018-10-30 DIAGNOSIS — N400 Enlarged prostate without lower urinary tract symptoms: Secondary | ICD-10-CM | POA: Diagnosis present

## 2018-10-30 DIAGNOSIS — M1990 Unspecified osteoarthritis, unspecified site: Secondary | ICD-10-CM | POA: Diagnosis present

## 2018-10-30 DIAGNOSIS — K921 Melena: Secondary | ICD-10-CM | POA: Diagnosis not present

## 2018-10-30 DIAGNOSIS — Z882 Allergy status to sulfonamides status: Secondary | ICD-10-CM | POA: Diagnosis not present

## 2018-10-30 DIAGNOSIS — K5731 Diverticulosis of large intestine without perforation or abscess with bleeding: Secondary | ICD-10-CM | POA: Diagnosis not present

## 2018-10-31 DIAGNOSIS — K921 Melena: Secondary | ICD-10-CM | POA: Diagnosis not present

## 2018-10-31 DIAGNOSIS — K5730 Diverticulosis of large intestine without perforation or abscess without bleeding: Secondary | ICD-10-CM | POA: Diagnosis not present

## 2018-10-31 DIAGNOSIS — K644 Residual hemorrhoidal skin tags: Secondary | ICD-10-CM | POA: Diagnosis not present

## 2018-10-31 DIAGNOSIS — K648 Other hemorrhoids: Secondary | ICD-10-CM | POA: Diagnosis not present

## 2018-11-01 DIAGNOSIS — K648 Other hemorrhoids: Secondary | ICD-10-CM | POA: Diagnosis not present

## 2018-11-01 DIAGNOSIS — K5730 Diverticulosis of large intestine without perforation or abscess without bleeding: Secondary | ICD-10-CM | POA: Diagnosis not present

## 2018-11-01 DIAGNOSIS — K921 Melena: Secondary | ICD-10-CM | POA: Diagnosis not present

## 2018-11-01 DIAGNOSIS — K644 Residual hemorrhoidal skin tags: Secondary | ICD-10-CM | POA: Diagnosis not present

## 2018-11-02 DIAGNOSIS — K648 Other hemorrhoids: Secondary | ICD-10-CM | POA: Diagnosis not present

## 2018-11-02 DIAGNOSIS — K5730 Diverticulosis of large intestine without perforation or abscess without bleeding: Secondary | ICD-10-CM | POA: Diagnosis not present

## 2018-11-02 DIAGNOSIS — K921 Melena: Secondary | ICD-10-CM | POA: Diagnosis not present

## 2018-11-02 DIAGNOSIS — K644 Residual hemorrhoidal skin tags: Secondary | ICD-10-CM | POA: Diagnosis not present

## 2018-11-03 DIAGNOSIS — K648 Other hemorrhoids: Secondary | ICD-10-CM | POA: Diagnosis not present

## 2018-11-03 DIAGNOSIS — K921 Melena: Secondary | ICD-10-CM | POA: Diagnosis not present

## 2018-11-03 DIAGNOSIS — K644 Residual hemorrhoidal skin tags: Secondary | ICD-10-CM | POA: Diagnosis not present

## 2018-11-03 DIAGNOSIS — K5730 Diverticulosis of large intestine without perforation or abscess without bleeding: Secondary | ICD-10-CM | POA: Diagnosis not present

## 2018-11-07 DIAGNOSIS — Z8673 Personal history of transient ischemic attack (TIA), and cerebral infarction without residual deficits: Secondary | ICD-10-CM | POA: Diagnosis not present

## 2018-11-07 DIAGNOSIS — K5732 Diverticulitis of large intestine without perforation or abscess without bleeding: Secondary | ICD-10-CM | POA: Diagnosis not present

## 2018-11-07 DIAGNOSIS — D6489 Other specified anemias: Secondary | ICD-10-CM | POA: Diagnosis not present

## 2018-11-07 DIAGNOSIS — R29818 Other symptoms and signs involving the nervous system: Secondary | ICD-10-CM | POA: Diagnosis not present

## 2018-11-08 ENCOUNTER — Telehealth: Payer: Self-pay | Admitting: Cardiology

## 2018-11-08 DIAGNOSIS — Z79899 Other long term (current) drug therapy: Secondary | ICD-10-CM | POA: Diagnosis not present

## 2018-11-08 DIAGNOSIS — K5731 Diverticulosis of large intestine without perforation or abscess with bleeding: Secondary | ICD-10-CM | POA: Diagnosis not present

## 2018-11-08 DIAGNOSIS — Z7902 Long term (current) use of antithrombotics/antiplatelets: Secondary | ICD-10-CM | POA: Diagnosis not present

## 2018-11-08 DIAGNOSIS — E785 Hyperlipidemia, unspecified: Secondary | ICD-10-CM | POA: Diagnosis present

## 2018-11-08 DIAGNOSIS — I10 Essential (primary) hypertension: Secondary | ICD-10-CM | POA: Diagnosis present

## 2018-11-08 DIAGNOSIS — I2510 Atherosclerotic heart disease of native coronary artery without angina pectoris: Secondary | ICD-10-CM | POA: Diagnosis present

## 2018-11-08 DIAGNOSIS — I482 Chronic atrial fibrillation, unspecified: Secondary | ICD-10-CM | POA: Diagnosis present

## 2018-11-08 DIAGNOSIS — Z951 Presence of aortocoronary bypass graft: Secondary | ICD-10-CM | POA: Diagnosis not present

## 2018-11-08 DIAGNOSIS — G459 Transient cerebral ischemic attack, unspecified: Secondary | ICD-10-CM | POA: Diagnosis not present

## 2018-11-08 DIAGNOSIS — R58 Hemorrhage, not elsewhere classified: Secondary | ICD-10-CM | POA: Diagnosis not present

## 2018-11-08 DIAGNOSIS — D62 Acute posthemorrhagic anemia: Secondary | ICD-10-CM | POA: Diagnosis not present

## 2018-11-08 DIAGNOSIS — Z7901 Long term (current) use of anticoagulants: Secondary | ICD-10-CM | POA: Diagnosis not present

## 2018-11-08 DIAGNOSIS — I959 Hypotension, unspecified: Secondary | ICD-10-CM | POA: Diagnosis not present

## 2018-11-08 DIAGNOSIS — K5790 Diverticulosis of intestine, part unspecified, without perforation or abscess without bleeding: Secondary | ICD-10-CM | POA: Diagnosis not present

## 2018-11-08 DIAGNOSIS — K625 Hemorrhage of anus and rectum: Secondary | ICD-10-CM | POA: Diagnosis not present

## 2018-11-08 DIAGNOSIS — K922 Gastrointestinal hemorrhage, unspecified: Secondary | ICD-10-CM | POA: Diagnosis not present

## 2018-11-08 DIAGNOSIS — K648 Other hemorrhoids: Secondary | ICD-10-CM | POA: Diagnosis present

## 2018-11-08 DIAGNOSIS — Z882 Allergy status to sulfonamides status: Secondary | ICD-10-CM | POA: Diagnosis not present

## 2018-11-08 DIAGNOSIS — K219 Gastro-esophageal reflux disease without esophagitis: Secondary | ICD-10-CM | POA: Diagnosis present

## 2018-11-08 DIAGNOSIS — D649 Anemia, unspecified: Secondary | ICD-10-CM | POA: Diagnosis not present

## 2018-11-08 DIAGNOSIS — K5730 Diverticulosis of large intestine without perforation or abscess without bleeding: Secondary | ICD-10-CM | POA: Diagnosis not present

## 2018-11-08 DIAGNOSIS — K921 Melena: Secondary | ICD-10-CM | POA: Diagnosis not present

## 2018-11-08 DIAGNOSIS — Z8673 Personal history of transient ischemic attack (TIA), and cerebral infarction without residual deficits: Secondary | ICD-10-CM | POA: Diagnosis not present

## 2018-11-08 DIAGNOSIS — M1990 Unspecified osteoarthritis, unspecified site: Secondary | ICD-10-CM | POA: Diagnosis present

## 2018-11-08 DIAGNOSIS — I4891 Unspecified atrial fibrillation: Secondary | ICD-10-CM | POA: Diagnosis not present

## 2018-11-08 NOTE — Telephone Encounter (Signed)
Amil Amen this is a very difficult situation being managed between his PCP and the gastroenterologist.  In general he should be anticoagulated however if he is having rectal bleeding that is not an option.  I think the best thing for her is to call the parent family doctor as they are directing this part of his care at this time.

## 2018-11-08 NOTE — Telephone Encounter (Signed)
Patient's wife is calling because her husband has been on eliquis which has caused rectal bleeding but when he's gone off of it he has mini-strokes. Patient's wife called 911 approximately 1 week ago due to the bleeding and he was hospitalized at [facility] for several days and was transfused while there. In the hospital he had a colonoscopy and per patient's wife, they only saw diverticulitis. To her knowledge, they did not find a source of bleeding. He remained off of eliquis until yesterday when he followed-up with his PCP and had bloodwork drawn. He was restarted on eliquis yesterday and began having rectal bleeding again. On the way to the PCP's office, before re-starting the eliquis, he had 3 mini strokes in the car where he had LUE numbness, slurred speech and loss of vision in Left eye. He re-started on eliquis and started having rectal bleeding again. She doesn't know how to proceed and is asking for your input.  Pls advise, tx

## 2018-11-08 NOTE — Telephone Encounter (Signed)
Patient's wife Joshua Chambers now mentions that Joshua Chambers is actually in the hospital now. She called 911 earlier today. She asked at the hospital for Dr.Munley to be called but apparently that option was not made available.

## 2018-11-09 NOTE — Telephone Encounter (Signed)
If at [facility] there is a [facility] cardiologist on site

## 2018-11-09 NOTE — Telephone Encounter (Signed)
You are correct and I am very surprised that they did not see to her wishes thank you for being her advocate

## 2018-11-09 NOTE — Telephone Encounter (Signed)
Returned call from [employer].  Informed that Dr Dulce Sellar said there should be a  Cardiologist at [facility]. She states that she requested a cardiologist see her husband but the Hospitalist and GI MD said there's no need for a cardiologist. Informed Irving Burton that she has the right to another opinion and can make that known.

## 2018-11-09 NOTE — Telephone Encounter (Signed)
Patients wife reports that no cardiologist has been in to see patient.

## 2018-11-21 DIAGNOSIS — D649 Anemia, unspecified: Secondary | ICD-10-CM | POA: Diagnosis not present

## 2018-11-21 DIAGNOSIS — K5730 Diverticulosis of large intestine without perforation or abscess without bleeding: Secondary | ICD-10-CM | POA: Diagnosis not present

## 2018-11-21 DIAGNOSIS — D6489 Other specified anemias: Secondary | ICD-10-CM | POA: Diagnosis not present

## 2018-11-21 DIAGNOSIS — K922 Gastrointestinal hemorrhage, unspecified: Secondary | ICD-10-CM | POA: Diagnosis not present

## 2018-11-21 DIAGNOSIS — I480 Paroxysmal atrial fibrillation: Secondary | ICD-10-CM | POA: Diagnosis not present

## 2018-12-26 DIAGNOSIS — D649 Anemia, unspecified: Secondary | ICD-10-CM | POA: Diagnosis not present

## 2019-01-13 ENCOUNTER — Other Ambulatory Visit: Payer: Self-pay | Admitting: Cardiology

## 2019-02-11 ENCOUNTER — Telehealth: Payer: Self-pay | Admitting: Cardiology

## 2019-02-11 NOTE — Telephone Encounter (Signed)
Spoke with patient's wife and she states that the patient has had worsening chest pains and shortness of breath that has been going on for several weeks.  She reports last episode was this morning.  He has not taken nitroglycerin with any of the symptoms that he reports.  Patient's wife reports monitoring blood pressure two times per day, and "it is all over the place."  Advised patient's wife that Dr Bettina Gavia will be made aware of the symptoms.  If patient has worsening episodes throughout the afternoon and night, patient should follow nitroglycerin protocol and report to nearest ED with no relief.  Patient's wife verbalized understanding and agreed to plan.  No further questions.

## 2019-02-11 NOTE — Telephone Encounter (Signed)
Patient's wife called stating patient is experiencing chest pains and SOB.  I made him appt for 03/12/2019 which was the 1st available..  Please call patient to discuss symptoms and determine if he needs to be seen sooner

## 2019-02-11 NOTE — Telephone Encounter (Signed)
him tomorrow at 2:00 and I will come back to the office

## 2019-02-12 NOTE — Progress Notes (Signed)
Cardiology Office Note:    Date:  02/13/2019   ID:  Joshua Chambers, Joshua Chambers May 27, 1923, MRN [id number]  PCP:  Street, Joshua Coup, MD  Cardiologist:  Joshua Herrlich, MD    Referring MD: [address] Joshua Chambers, *    ASSESSMENT:    1. SOB (shortness of breath) on exertion   2. Chest pain of uncertain etiology   3. Orthostatic hypotension   4. Coronary artery disease involving coronary bypass graft of native heart without angina pectoris; CAD (coronary artery disease) of artery bypass graft:  loss of VG to RCA and VG-Diag   5. PAF (paroxysmal atrial fibrillation) (HCC)   6. Chronic anticoagulation   7. Dyslipidemia, goal LDL below 70    PLAN:    In order of problems listed above:  1. Shortness of breath I suspect is related to anemia and perhaps an element of heart failure and has been placed on a loop diuretic without seeing proBNP level with the symptomatic orthostatic hypotension will be done today and if significantly elevated will give him minimal dose of furosemide.  He may require IV iron or further transfusion 2. Very atypical nonanginal chest pain I offered him reassurance and I would not pursue an ischemia evaluation 3. Stable asymptomatic continue his alpha agonist 4. Stable CAD at this time I would not pursue an ischemia evaluation 5. Stable maintaining sinus rhythm continue beta-blocker anticoagulant 6. Continue his anticoagulant unless he has ongoing recurrent bleeding.  We could consider a watchman procedure if necessary 7. Continue his high intensity statin at his age I would not intensify lipid-lowering treatment   Next appointment: 2 to 3 weeks   Medication Adjustments/Labs and Tests Ordered: Current medicines are reviewed at length with the patient today.  Concerns regarding medicines are outlined above.  Orders Placed This Encounter  Procedures  . Basic Metabolic Panel (BMET)  . Pro b natriuretic peptide (BNP)  . EKG 12-Lead   No orders of the defined types  were placed in this encounter.   No chief complaint on file.   History of Present Illness:    Joshua Chambers is a 85 y.o. male with a hx of CAD CABG in 2007,  hypertension, orthostatic hypotension, APC's, and TIA  last seen 09/20/2018.  In the interim he has had 2 hospitalizations with a GI bleed only record I have his hemoglobin 11/06/2018 of 8.1 and he told me he was transfused a total of 2 units of blood.  He had a colonoscopy his anticoagulants are transiently disrupted placed back on Eliquis.  He tells me he remains anemic by physical exam and says hemoglobin is between 8-10.  Since then he does not feel as well he is increasingly weak he short of breath when he hurries walking through the house but no orthopnea or PND he has had brief momentary point localized chest pain but no angina.  His EKG in the office today shows sinus rhythm with APCs no acute ischemic changes.  On physical exam he has 1-2+ edema I suspect he has mild heart failure in the setting of anemia hospitalization transfusion and I will check a proBNP and renal function with other labs being drawn today as an outpatient through Dr. Jennye Chambers including a CBC.  If proBNP is significantly elevated I will put him on a minimal dose of diuretic cautiously with the symptomatic orthostatic hypotension and will see back in the office in 2 to 3 weeks.  In particular I would not pursue an ischemia evaluation  as he be very high risk of bleeding with combined dual antiplatelet and anticoagulant therapy. Compliance with diet, lifestyle and medications: Yes Past Medical History:  Diagnosis Date  . Arteriosclerosis of arterial coronary artery bypass graft   . Arthritis   . Atrial contractions, premature   . Atrial fibrillation ([place])   . Benign hypertension   . Bilateral carotid artery stenosis   . CAD (coronary artery disease), autologous vein bypass graft 04/19/2013   100% flush occlusion of SVG-OM & SVG-Diag; near Subtotal Occlusion of  SVG-RCA (PCI of RCA done to avoid distal embolization with PCI to SVG)  . CAD (coronary artery disease), native coronary artery 2007   Multivessel CAD referred for CABG  . CAD S/P percutaneous coronary angioplasty  04/19/2013   PCI of Native RCA - Promus Premier DES 3.0 mm x 38 mm (3.6 mm);   [name] Dyslipidemia   . Dyslipidemia, goal LDL below 70   . GERD (gastroesophageal reflux disease)   . History of DVT of lower extremity    Right popliteal vein  . History of stroke August 2011   Possible TIA in 2001;  Echo: EF 45-50%, mild HK of anterior septum.  [name] History of transient cerebral ischemia   . Hx of orthostatic hypotension 2009   Positive tilt table:  dizziness/vasopressor type; previously on the decision   . Hx SBO   . Nephrolithiasis   . Orthostatic hypotension   . S/P CABG x 3 2007   LIMA-LAD, SVG-DIAG, SVG-RCA  . Stroke ([place])   . Unsteady gait     Past Surgical History:  Procedure Laterality Date  . APPENDECTOMY    . CARDIAC SURGERY  2000s  . COLON SURGERY    . CORONARY ANGIOPLASTY WITH STENT PLACEMENT  04/19/13   Stent-Promus DES (3.0 mm x 38 mm - 3.6 mm) to native RCA prox-mid,  . CORONARY ARTERY BYPASS GRAFT  2007   LIMA-LAD, SVG-D1, SVG-RCA  . LEFT HEART CATHETERIZATION WITH CORONARY/GRAFT ANGIOGRAM Left 04/19/2013   LEFT HEART CATH W/ CORONARY/GRAFT ANGIOGRAM;  Joshua Man, MD;  Location: [facility] CATH LAB;  LM 20%-->LAD 70-80% then subTO after D1 (Patent LIMA-LAD); Small non-dom Cx ~20% ostial.  RCA long 60-70% prox --> 90% mid.  SVG-OM & SVG-Diag 100%, diffuse Subtotal occlusion of SVG-rPD  . STENT PLACEMENT VASCULAR ([place] HX)    . TONSILLECTOMY      Current Medications: Current Meds  Medication Sig  . acetaminophen (TYLENOL) 500 MG tablet Take 1,000 mg by mouth at bedtime.  [name] apixaban (ELIQUIS) 5 MG TABS tablet Take 1 tablet (5 mg total) by mouth 2 (two) times daily.  [name] atorvastatin (LIPITOR) 40 MG tablet TAKE 0.5 TABLET BY MOUTH DAILY AT 6:00 PM  . dutasteride  (AVODART) 0.5 MG capsule Take 0.5 mg by mouth daily.   . metoprolol tartrate (LOPRESSOR) 25 MG tablet Take 0.5 tablets (12.5 mg total) by mouth daily.  . midodrine (PROAMATINE) 10 MG tablet TAKE 1 TABLET(10 MG) BY MOUTH THREE TIMES DAILY  . nitroGLYCERIN (NITROSTAT) 0.4 MG SL tablet Place 1 tablet (0.4 mg total) under the tongue every 5 (five) minutes as needed for chest pain.  . Polyvinyl Alcohol-Povidone (REFRESH OP) Place 1 drop into both eyes daily as needed.  [name] PULMICORT FLEXHALER 180 MCG/ACT inhaler Inhale 2 puffs into the lungs daily.      Allergies:   Amiodarone, Diltiazem, Statins, and Sulfa antibiotics   Social History   Socioeconomic History  . Marital status: Married  Spouse name: Not on file  . Number of children: Not on file  . Years of education: Not on file  . Highest education level: Not on file  Occupational History  . Not on file  Social Needs  . Financial resource strain: Not on file  . Food insecurity    Worry: Not on file    Inability: Not on file  . Transportation needs    Medical: Not on file    Non-medical: Not on file  Tobacco Use  . Smoking status: Never Smoker  . Smokeless tobacco: Never Used  Substance and Sexual Activity  . Alcohol use: No  . Drug use: No  . Sexual activity: Not on file  Lifestyle  . Physical activity    Days per week: Not on file    Minutes per session: Not on file  . Stress: Not on file  Relationships  . Social Musician on phone: Not on file    Gets together: Not on file    Attends religious service: Not on file    Active member of club or organization: Not on file    Attends meetings of clubs or organizations: Not on file    Relationship status: Not on file  Other Topics Concern  . Not on file  Social History Narrative    He is now remarried for the last 2 years, his first wife of 60 years is deceased. He has 1   child from that first marriage and 2 grandchildren.    He does not smoke, does not drink.     He is not very active but he does kind of meander around with his new wife who is permanently disabled.      Family History: The patient's family history includes Colon cancer in his father; Hypertension in his son. ROS:   Please see the history of present illness.    All other systems reviewed and are negative.  EKGs/Labs/Other Studies Reviewed:    The following studies were reviewed today:  EKG:  EKG ordered today and personally reviewed.  The ekg ordered today demonstrates sinus rhythm APCs nonspecific T waves no acute ischemic changes  Recent Labs: Done at [facility] 11/12/2018 hemoglobin 8.1 potassium 4.0 creatinine 0.7 GFR greater than 60 cc proBNP level 885 troponin normal cholesterol 174 LDL 111 Recent Lipid Panel    Component Value Date/Time   CHOL 172 03/17/2017 0006   TRIG 103 03/17/2017 0006   HDL 47 03/17/2017 0006   CHOLHDL 3.7 03/17/2017 0006   VLDL 21 03/17/2017 0006   LDLCALC 104 (H) 03/17/2017 0006    Physical Exam:    VS:  BP 102/70 (BP Location: Right Arm, Patient Position: Sitting, Cuff Size: Normal)   Pulse 69   Temp 99.1 F (37.3 C)   Ht 6\' 2"  (1.88 m)   Wt 195 lb 3.2 oz (88.5 kg)   BMI 25.06 kg/m     Wt Readings from Last 3 Encounters:  02/13/19 195 lb 3.2 oz (88.5 kg)  09/20/18 198 lb 12.8 oz (90.2 kg)  04/17/18 192 lb 6.4 oz (87.3 kg)     GEN: He looks increasingly frail and has pallor the skin or membranes well nourished, well developed in no acute distress HEENT: Normal NECK: No JVD; No carotid bruits LYMPHATICS: No lymphadenopathy CARDIAC: RRR, no murmurs, rubs, gallops RESPIRATORY:  Clear to auscultation without rales, wheezing or rhonchi  ABDOMEN: Soft, non-tender, non-distended MUSCULOSKELETAL: 1-2+ ankle to knee pitting bilateral edema;  No deformity  SKIN: Warm and dry NEUROLOGIC:  Alert and oriented x 3 PSYCHIATRIC:  Normal affect    Signed, Joshua Herrlich, MD  02/13/2019 12:07 PM    [facility]

## 2019-02-12 NOTE — Telephone Encounter (Signed)
Spoke with patient's wife Raquel Sarna and she reports no recent or worsening symptoms at this time.  Raquel Sarna advised of appointment availability on 02-13-2019 at [facility] [place] instructed to proceed to nearest ED if symptoms worsen before scheduled appointment with Dr Armond Hang.  Raquel Sarna agreed to plan and verbalized understanding.

## 2019-02-13 ENCOUNTER — Ambulatory Visit (INDEPENDENT_AMBULATORY_CARE_PROVIDER_SITE_OTHER): Payer: Medicare Other | Admitting: Cardiology

## 2019-02-13 ENCOUNTER — Encounter: Payer: Self-pay | Admitting: Cardiology

## 2019-02-13 ENCOUNTER — Other Ambulatory Visit: Payer: Self-pay

## 2019-02-13 ENCOUNTER — Other Ambulatory Visit: Payer: Self-pay | Admitting: Cardiology

## 2019-02-13 VITALS — BP 102/70 | HR 69 | Temp 99.1°F | Ht 74.0 in | Wt 195.2 lb

## 2019-02-13 DIAGNOSIS — E785 Hyperlipidemia, unspecified: Secondary | ICD-10-CM

## 2019-02-13 DIAGNOSIS — I951 Orthostatic hypotension: Secondary | ICD-10-CM | POA: Diagnosis not present

## 2019-02-13 DIAGNOSIS — R0602 Shortness of breath: Secondary | ICD-10-CM | POA: Diagnosis not present

## 2019-02-13 DIAGNOSIS — D649 Anemia, unspecified: Secondary | ICD-10-CM | POA: Diagnosis not present

## 2019-02-13 DIAGNOSIS — Z7901 Long term (current) use of anticoagulants: Secondary | ICD-10-CM

## 2019-02-13 DIAGNOSIS — I480 Paroxysmal atrial fibrillation: Secondary | ICD-10-CM | POA: Diagnosis not present

## 2019-02-13 DIAGNOSIS — R0789 Other chest pain: Secondary | ICD-10-CM | POA: Diagnosis not present

## 2019-02-13 DIAGNOSIS — I25810 Atherosclerosis of coronary artery bypass graft(s) without angina pectoris: Secondary | ICD-10-CM | POA: Diagnosis not present

## 2019-02-13 DIAGNOSIS — R079 Chest pain, unspecified: Secondary | ICD-10-CM

## 2019-02-13 NOTE — Patient Instructions (Signed)
Medication Instructions:  Your physician recommends that you continue on your current medications as directed. Please refer to the Current Medication list given to you today.  If you need a refill on your cardiac medications before your next appointment, please call your pharmacy.   Lab work: Your physician recommends that you return for lab work today: BMP, ProBNP. Please go to [facility] to have labs drawn.   If you have labs (blood work) drawn today and your tests are completely normal, you will receive your results only by: [name] MyChart Message (if you have MyChart) OR . A paper copy in the mail If you have any lab test that is abnormal or we need to change your treatment, we will call you to review the results.  Testing/Procedures: You had an EKG today.   Follow-Up: At [facility], you and your health needs are our priority.  As part of our continuing mission to provide you with exceptional heart care, we have created designated Provider Care Teams.  These Care Teams include your primary Cardiologist (physician) and Advanced Practice Providers (APPs -  Physician Assistants and Nurse Practitioners) who all work together to provide you with the care you need, when you need it. You will need a follow up appointment in 2 weeks.

## 2019-02-14 LAB — BASIC METABOLIC PANEL
BUN/Creatinine Ratio: 24 (ref 10–24)
BUN: 27 mg/dL (ref 10–36)
CO2: 24 mmol/L (ref 20–29)
Calcium: 10 mg/dL (ref 8.6–10.2)
Chloride: 103 mmol/L (ref 96–106)
Creatinine, Ser: 1.11 mg/dL (ref 0.76–1.27)
GFR calc Af Amer: 66 mL/min/{1.73_m2} (ref >59)
GFR calc non Af Amer: 57 mL/min/{1.73_m2} — ABNORMAL LOW (ref >59)
Glucose: 101 mg/dL — ABNORMAL HIGH (ref 65–99)
Potassium: 4.6 mmol/L (ref 3.5–5.2)
Sodium: 137 mmol/L (ref 134–144)

## 2019-02-14 LAB — PRO B NATRIURETIC PEPTIDE: NT-Pro BNP: 881 pg/mL — ABNORMAL HIGH (ref 0–486)

## 2019-02-18 ENCOUNTER — Telehealth: Payer: Self-pay | Admitting: *Deleted

## 2019-02-18 DIAGNOSIS — R5382 Chronic fatigue, unspecified: Secondary | ICD-10-CM

## 2019-02-18 DIAGNOSIS — R0600 Dyspnea, unspecified: Secondary | ICD-10-CM

## 2019-02-18 DIAGNOSIS — I255 Ischemic cardiomyopathy: Secondary | ICD-10-CM

## 2019-02-18 DIAGNOSIS — I480 Paroxysmal atrial fibrillation: Secondary | ICD-10-CM

## 2019-02-18 DIAGNOSIS — I25810 Atherosclerosis of coronary artery bypass graft(s) without angina pectoris: Secondary | ICD-10-CM

## 2019-02-18 DIAGNOSIS — I10 Essential (primary) hypertension: Secondary | ICD-10-CM

## 2019-02-18 MED ORDER — FUROSEMIDE 20 MG PO TABS: 20.0000 mg | ORAL_TABLET | Freq: Every day | ORAL | 3 refills | Status: DC

## 2019-02-18 NOTE — Telephone Encounter (Signed)
Patient's wife, Raquel Sarna, per DPR informed of patient's lab results and advised for patient to start taking furosemide 20 mg daily. Raquel Sarna is agreeable. Prescription has been sent to [facility] in [place] as requested. Patient will return to the [place] office for repeat lab work in 2 weeks. No appointment needed, no need to fast beforehand. Raquel Sarna verbalized understanding. No further questions.

## 2019-03-05 NOTE — Progress Notes (Signed)
Cardiology Office Note:    Date:  03/06/2019   ID:  Joshua Chambers, Joshua Chambers [birth year], MRN [id number]  PCP:  Street, Stephanie Coup, MD  Cardiologist:  Norman Herrlich, MD    Referring MD: [address] Stephanie Coup, *    ASSESSMENT:    1. PAF (paroxysmal atrial fibrillation) (HCC)   2. Coronary artery disease involving coronary bypass graft of native heart without angina pectoris; CAD (coronary artery disease) of artery bypass graft:  loss of VG to RCA and VG-Diag   3. Chronic anticoagulation   4. Dyslipidemia, goal LDL below 70   5. Dyspnea, unspecified type    PLAN:    In order of problems listed above:  1. PAF - Denies palpitation, irregular heart beat. Anticoagulated on Eliquis. Continue Metoprolol. 2. Chronic anticoagulation - Secondary to atrial fibrillation. 2 episodes of GI bleed which he has been following with Dr. Braulio Conte. Denies bleeding complications including hematuria and melena since last seen. Tells me his blood counts were improved. CBC today. 3. CAD - Stable. Denies chest pain. Reports his dyspnea on exertion is improved and now stable at baseline. Severe DOE at last office visit likely due to volume overload and anemia which have been corrected. No indication for ischemic evaluation at this time. Continue GDMT beta blocker, statin - no asa in the setting of chronic anticoagulation.  4. DLD - LDL goal <70 in setting of CAD. Continue statin. Will need lipid profile checked at next office visit. 5. Chronic systolic and diastolic HF - Stable. Echo 12/2017 EF 40-45%, diastolic dysfunction, mild-mod MR. NYHA class I-II with some dyspnea on exertion. After our last visit was started on lasix 20mg  daily for ProBNP of 881. Decrease lasix to 20mg  M, W, F - ProBNP, BMET today. Euvolemic on exam. Denies edema.  6. Chronic hypotension - BP stable today. Continue Midodrine. Reports feeling poorly first thing in the morning and fatigued - recommended taking his Midodrine then waiting an hour  until starting activity such as showering.    Next appointment: 3 months   Medication Adjustments/Labs and Tests Ordered: Current medicines are reviewed at length with the patient today.  Concerns regarding medicines are outlined above.  Orders Placed This Encounter  Procedures  . Pro b natriuretic peptide (BNP)  . CBC  . Basic Metabolic Panel (BMET)   Meds ordered this encounter  Medications  . furosemide (LASIX) 20 MG tablet    Sig: Take 20mg  (one tablet) on Mondays, Wednesdays, and Fridays    Dispense:  30 tablet    Refill:  3  . nitroGLYCERIN (NITROSTAT) 0.4 MG SL tablet    Sig: Place 1 tablet (0.4 mg total) under the tongue every 5 (five) minutes as needed for chest pain.    Dispense:  25 tablet    Refill:  6    No chief complaint on file.   History of Present Illness:    Joshua Chambers is a 85 y.o. male with a hx of CAD CABG in 2007,  hypertension, orthostatic hypotension, APC's, and TIA and two recent hospitalization with GI bleed anemia and transfusion last seen 02/13/2019.  Reports feeling well. Saw Dr. 83 recently and was told his Hb had improved. Denies recurrent bleeding complications. Tells me his energy level is returning, but still feels fatigued first thing in the morning. We discussed this may be due to his hypotension and suggested taking his Midodrine before doing activity such as showering. Denies chest pain. Reports he still has some dyspnea on exertion,  but is improved since we last saw him. Tells me it is about at his baseline. He tells me he has been taking his fluid pill and denies lower extremity edema, orthopnea.   Compliance with diet, lifestyle and medications: Yes Past Medical History:  Diagnosis Date  . Arteriosclerosis of arterial coronary artery bypass graft   . Arthritis   . Atrial contractions, premature   . Atrial fibrillation (HCC)   . Benign hypertension   . Bilateral carotid artery stenosis   . CAD (coronary artery disease),  autologous vein bypass graft 04/19/2013   100% flush occlusion of SVG-OM & SVG-Diag; near Subtotal Occlusion of SVG-RCA (PCI of RCA done to avoid distal embolization with PCI to SVG)  . CAD (coronary artery disease), native coronary artery 2007   Multivessel CAD referred for CABG  . CAD S/P percutaneous coronary angioplasty  04/19/2013   PCI of Native RCA - Promus Premier DES 3.0 mm x 38 mm (3.6 mm);   [name] Dyslipidemia   . Dyslipidemia, goal LDL below 70   . GERD (gastroesophageal reflux disease)   . History of DVT of lower extremity    Right popliteal vein  . History of stroke August 2011   Possible TIA in 2001;  Echo: EF 45-50%, mild HK of anterior septum.  [name] History of transient cerebral ischemia   . Hx of orthostatic hypotension 2009   Positive tilt table:  dizziness/vasopressor type; previously on the decision   . Hx SBO   . Nephrolithiasis   . Orthostatic hypotension   . S/P CABG x 3 2007   LIMA-LAD, SVG-DIAG, SVG-RCA  . Stroke (HCC)   . Unsteady gait     Past Surgical History:  Procedure Laterality Date  . APPENDECTOMY    . CARDIAC SURGERY  2000s  . COLON SURGERY    . CORONARY ANGIOPLASTY WITH STENT PLACEMENT  04/19/13   Stent-Promus DES (3.0 mm x 38 mm - 3.6 mm) to native RCA prox-mid,  . CORONARY ARTERY BYPASS GRAFT  2007   LIMA-LAD, SVG-D1, SVG-RCA  . LEFT HEART CATHETERIZATION WITH CORONARY/GRAFT ANGIOGRAM Left 04/19/2013   LEFT HEART CATH W/ CORONARY/GRAFT ANGIOGRAM;  [name], MD;  Location: [facility] CATH LAB;  LM 20%-->LAD 70-80% then subTO after D1 (Patent LIMA-LAD); Small non-dom Cx ~20% ostial.  RCA long 60-70% prox --> 90% mid.  SVG-OM & SVG-Diag 100%, diffuse Subtotal occlusion of SVG-rPD  . STENT PLACEMENT VASCULAR ([facility] HX)    . TONSILLECTOMY      Current Medications: Current Meds  Medication Sig  . acetaminophen (TYLENOL) 500 MG tablet Take 1,000 mg by mouth at bedtime.  [name] apixaban (ELIQUIS) 5 MG TABS tablet Take 1 tablet (5 mg total) by mouth 2 (two)  times daily.  [name] atorvastatin (LIPITOR) 40 MG tablet TAKE 0.5 TABLET BY MOUTH DAILY AT 6:00 PM  . dutasteride (AVODART) 0.5 MG capsule Take 0.5 mg by mouth daily.   . furosemide (LASIX) 20 MG tablet Take 20mg  (one tablet) on Mondays, Wednesdays, and Fridays  . metoprolol tartrate (LOPRESSOR) 25 MG tablet Take 0.5 tablets (12.5 mg total) by mouth daily.  . midodrine (PROAMATINE) 10 MG tablet TAKE 1 TABLET(10 MG) BY MOUTH THREE TIMES DAILY  . nitroGLYCERIN (NITROSTAT) 0.4 MG SL tablet Place 1 tablet (0.4 mg total) under the tongue every 5 (five) minutes as needed for chest pain.  . Polyvinyl Alcohol-Povidone (REFRESH OP) Place 1 drop into both eyes daily as needed.  [name] PULMICORT FLEXHALER 180 MCG/ACT inhaler Inhale 2  puffs into the lungs daily.   . [DISCONTINUED] furosemide (LASIX) 20 MG tablet Take 1 tablet (20 mg total) by mouth daily.  . [DISCONTINUED] nitroGLYCERIN (NITROSTAT) 0.4 MG SL tablet Place 1 tablet (0.4 mg total) under the tongue every 5 (five) minutes as needed for chest pain.     Allergies:   Amiodarone, Diltiazem, Statins, and Sulfa antibiotics   Social History   Socioeconomic History  . Marital status: Married    Spouse name: Not on file  . Number of children: Not on file  . Years of education: Not on file  . Highest education level: Not on file  Occupational History  . Not on file  Social Needs  . Financial resource strain: Not on file  . Food insecurity    Worry: Not on file    Inability: Not on file  . Transportation needs    Medical: Not on file    Non-medical: Not on file  Tobacco Use  . Smoking status: Never Smoker  . Smokeless tobacco: Never Used  Substance and Sexual Activity  . Alcohol use: No  . Drug use: No  . Sexual activity: Not on file  Lifestyle  . Physical activity    Days per week: Not on file    Minutes per session: Not on file  . Stress: Not on file  Relationships  . Social Herbalist on phone: Not on file    Gets together:  Not on file    Attends religious service: Not on file    Active member of club or organization: Not on file    Attends meetings of clubs or organizations: Not on file    Relationship status: Not on file  Other Topics Concern  . Not on file  Social History Narrative    He is now remarried for the last 2 years, his first wife of 35 years is deceased. He has 1   child from that first marriage and 2 grandchildren.    He does not smoke, does not drink.    He is not very active but he does kind of meander around with his new wife who is permanently disabled.      Family History: The patient's family history includes Colon cancer in his father; Hypertension in his son. ROS:   Please see the history of present illness.    All other systems reviewed and are negative. Review of Systems  Constitution: Positive for malaise/fatigue (in the morning). Negative for chills and fever.  Cardiovascular: Positive for dyspnea on exertion. Negative for chest pain, irregular heartbeat, leg swelling, near-syncope and palpitations.  Respiratory: Negative for cough, shortness of breath and wheezing.   Gastrointestinal: Negative for melena, nausea and vomiting.  Neurological: Negative for dizziness, light-headedness and weakness.     EKGs/Labs/Other Studies Reviewed:    The following studies were reviewed today:  Echo 12/2017  EKG:  No EKG today  Recent Labs: NT Pro BNP 02/13/2019:  881 02/13/2019: BUN 27; Creatinine, Ser 1.11; NT-Pro BNP 881; Potassium 4.6; Sodium 137  Recent Lipid Panel    Component Value Date/Time   CHOL 172 03/17/2017 0006   TRIG 103 03/17/2017 0006   HDL 47 03/17/2017 0006   CHOLHDL 3.7 03/17/2017 0006   VLDL 21 03/17/2017 0006   LDLCALC 104 (H) 03/17/2017 0006    Physical Exam:    VS:  BP 104/70 (BP Location: Left Arm, Patient Position: Sitting, Cuff Size: Normal)   Pulse 62   Ht 6'  2" (1.88 m)   Wt 196 lb (88.9 kg)   SpO2 95%   BMI 25.16 kg/m     Wt Readings from  Last 3 Encounters:  03/06/19 196 lb (88.9 kg)  02/13/19 195 lb 3.2 oz (88.5 kg)  09/20/18 198 lb 12.8 oz (90.2 kg)     GEN:  Well nourished, well developed in no acute distress HEENT: Normal NECK: No JVD; No carotid bruits LYMPHATICS: No lymphadenopathy CARDIAC: RRR, no murmurs, rubs, gallops RESPIRATORY:  Clear to auscultation without rales, wheezing or rhonchi  ABDOMEN: Soft, non-tender, non-distended MUSCULOSKELETAL:  No edema; No deformity  SKIN: Warm and dry NEUROLOGIC:  Alert and oriented x 3 PSYCHIATRIC:  Normal affect    Signed, Norman Herrlich, MD  03/06/2019 12:04 PM    [facility]

## 2019-03-06 ENCOUNTER — Other Ambulatory Visit: Payer: Self-pay

## 2019-03-06 ENCOUNTER — Ambulatory Visit (INDEPENDENT_AMBULATORY_CARE_PROVIDER_SITE_OTHER): Payer: Medicare Other | Admitting: Cardiology

## 2019-03-06 ENCOUNTER — Encounter: Payer: Self-pay | Admitting: Cardiology

## 2019-03-06 VITALS — BP 104/70 | HR 62 | Ht 74.0 in | Wt 196.0 lb

## 2019-03-06 DIAGNOSIS — Z7901 Long term (current) use of anticoagulants: Secondary | ICD-10-CM | POA: Diagnosis not present

## 2019-03-06 DIAGNOSIS — R0600 Dyspnea, unspecified: Secondary | ICD-10-CM | POA: Diagnosis not present

## 2019-03-06 DIAGNOSIS — E785 Hyperlipidemia, unspecified: Secondary | ICD-10-CM

## 2019-03-06 DIAGNOSIS — I25810 Atherosclerosis of coronary artery bypass graft(s) without angina pectoris: Secondary | ICD-10-CM

## 2019-03-06 DIAGNOSIS — I5042 Chronic combined systolic (congestive) and diastolic (congestive) heart failure: Secondary | ICD-10-CM | POA: Diagnosis not present

## 2019-03-06 DIAGNOSIS — I10 Essential (primary) hypertension: Secondary | ICD-10-CM | POA: Diagnosis not present

## 2019-03-06 DIAGNOSIS — I480 Paroxysmal atrial fibrillation: Secondary | ICD-10-CM | POA: Diagnosis not present

## 2019-03-06 MED ORDER — NITROGLYCERIN 0.4 MG SL SUBL: 0.4000 mg | SUBLINGUAL_TABLET | SUBLINGUAL | 6 refills | Status: AC | PRN

## 2019-03-06 MED ORDER — FUROSEMIDE 20 MG PO TABS: ORAL_TABLET | ORAL | 3 refills | Status: DC

## 2019-03-06 NOTE — Patient Instructions (Signed)
Medication Instructions:  Your physician has recommended you make the following change in your medication:   CHANGE your Lasix 20mg  to Mondays, Wednesdays, Fridays only  If you need a refill on your cardiac medications before your next appointment, please call your pharmacy.   Lab work: Your physician recommends that you return for lab work today: CBC, BMET, ProBNP  If you have labs (blood work) drawn today and your tests are completely normal, you will receive your results only by: [name] MyChart Message (if you have MyChart) OR . A paper copy in the mail If you have any lab test that is abnormal or we need to change your treatment, we will call you to review the results.  Testing/Procedures: None ordered today.  Follow-Up: At [facility], you and your health needs are our priority.  As part of our continuing mission to provide you with exceptional heart care, we have created designated Provider Care Teams.  These Care Teams include your primary Cardiologist (physician) and Advanced Practice Providers (APPs -  Physician Assistants and Nurse Practitioners) who all work together to provide you with the care you need, when you need it. You will need a follow up appointment in 2 months.   Any Other Special Instructions Will Be Listed Below (If Applicable).  Continue to monitor for signs of bleeding. If you notice blood in your urine, blood in your stool, or dark black stool please call our office and/or Dr. Onnie Boer and/or your PCP.

## 2019-03-12 ENCOUNTER — Ambulatory Visit: Payer: Medicare Other | Admitting: Cardiology

## 2019-03-12 LAB — CBC
Hematocrit: 35.3 % — ABNORMAL LOW (ref 37.5–51.0)
Hemoglobin: 11.5 g/dL — ABNORMAL LOW (ref 13.0–17.7)
MCH: 30.1 pg (ref 26.6–33.0)
MCHC: 32.6 g/dL (ref 31.5–35.7)
MCV: 92 fL (ref 79–97)
Platelets: 318 10*3/uL (ref 150–450)
RBC: 3.82 x10E6/uL — ABNORMAL LOW (ref 4.14–5.80)
RDW: 12.1 % (ref 11.6–15.4)
WBC: 6.1 10*3/uL (ref 3.4–10.8)

## 2019-03-12 LAB — BASIC METABOLIC PANEL
BUN/Creatinine Ratio: 26 — ABNORMAL HIGH (ref 10–24)
BUN: 29 mg/dL (ref 10–36)
CO2: 20 mmol/L (ref 20–29)
Calcium: 9.8 mg/dL (ref 8.6–10.2)
Chloride: 103 mmol/L (ref 96–106)
Creatinine, Ser: 1.1 mg/dL (ref 0.76–1.27)
GFR calc Af Amer: 67 mL/min/{1.73_m2} (ref >59)
GFR calc non Af Amer: 58 mL/min/{1.73_m2} — ABNORMAL LOW (ref >59)
Glucose: 87 mg/dL (ref 65–99)
Potassium: 4.5 mmol/L (ref 3.5–5.2)
Sodium: 142 mmol/L (ref 134–144)

## 2019-03-12 LAB — PRO B NATRIURETIC PEPTIDE: NT-Pro BNP: 459 pg/mL (ref 0–486)

## 2019-03-13 ENCOUNTER — Telehealth: Payer: Self-pay | Admitting: *Deleted

## 2019-03-13 NOTE — Telephone Encounter (Signed)
-----   Message from Richardo Priest, MD sent at 03/12/2019  4:58 PM EDT ----- Normal or stable result  Good results including CBC I hope he is feeling better

## 2019-03-13 NOTE — Telephone Encounter (Signed)
Telephone call to patient. Left message per DPR that labs were normal/stable and to call with any questions

## 2019-03-29 ENCOUNTER — Telehealth: Payer: Self-pay | Admitting: Cardiology

## 2019-03-29 NOTE — Telephone Encounter (Signed)
Patient does not need called in!!!

## 2019-03-29 NOTE — Telephone Encounter (Signed)
°*  STAT* If patient is at the pharmacy, call can be transferred to refill team.   1. Which medications need to be refilled? (please list name of each medication and dose if known) Nitroglycerin   2. Which pharmacy/location (including street and city if local pharmacy) is medication to be sent to?[facility] on [address]   3. Do they need a 30 day or 90 day supply?    Patient DOES NOT USE [facility]

## 2019-04-22 ENCOUNTER — Ambulatory Visit: Payer: Medicare Other | Admitting: Cardiology

## 2019-05-01 DIAGNOSIS — Z23 Encounter for immunization: Secondary | ICD-10-CM | POA: Diagnosis not present

## 2019-05-05 ENCOUNTER — Other Ambulatory Visit: Payer: Self-pay | Admitting: Cardiology

## 2019-05-06 NOTE — Telephone Encounter (Signed)
[place]rod[place] refill sent to [facility] on [address]

## 2019-05-07 DIAGNOSIS — R310 Gross hematuria: Secondary | ICD-10-CM | POA: Diagnosis not present

## 2019-05-07 DIAGNOSIS — N390 Urinary tract infection, site not specified: Secondary | ICD-10-CM | POA: Diagnosis not present

## 2019-05-07 DIAGNOSIS — Z6825 Body mass index (BMI) 25.0-25.9, adult: Secondary | ICD-10-CM | POA: Diagnosis not present

## 2019-05-08 DIAGNOSIS — R1310 Dysphagia, unspecified: Secondary | ICD-10-CM | POA: Diagnosis not present

## 2019-05-17 ENCOUNTER — Other Ambulatory Visit: Payer: Self-pay

## 2019-05-17 ENCOUNTER — Encounter: Payer: Self-pay | Admitting: Cardiology

## 2019-05-17 ENCOUNTER — Ambulatory Visit (INDEPENDENT_AMBULATORY_CARE_PROVIDER_SITE_OTHER): Payer: Medicare Other | Admitting: Cardiology

## 2019-05-17 VITALS — BP 100/72 | HR 47 | Ht 74.0 in | Wt 197.0 lb

## 2019-05-17 DIAGNOSIS — I10 Essential (primary) hypertension: Secondary | ICD-10-CM

## 2019-05-17 DIAGNOSIS — I25810 Atherosclerosis of coronary artery bypass graft(s) without angina pectoris: Secondary | ICD-10-CM

## 2019-05-17 DIAGNOSIS — I5042 Chronic combined systolic (congestive) and diastolic (congestive) heart failure: Secondary | ICD-10-CM

## 2019-05-17 DIAGNOSIS — I480 Paroxysmal atrial fibrillation: Secondary | ICD-10-CM

## 2019-05-17 DIAGNOSIS — I951 Orthostatic hypotension: Secondary | ICD-10-CM

## 2019-05-17 MED ORDER — MIDODRINE HCL 10 MG PO TABS: 10.0000 mg | ORAL_TABLET | Freq: Four times a day (QID) | ORAL | 0 refills | Status: DC

## 2019-05-17 NOTE — Progress Notes (Signed)
Cardiology Office Note:    Date:  05/17/2019   ID:  Joshua Chambers, Joshua Chambers October 14, 1922, MRN [id number]  PCP:  Street, Joshua Coup, MD  Cardiologist:  Joshua Herrlich, MD    Referring MD: [address], Joshua Chambers, *    ASSESSMENT:    1. Coronary artery disease involving coronary bypass graft of native heart without angina pectoris; CAD (coronary artery disease) of artery bypass graft:  loss of VG to RCA and VG-Diag   2. Essential hypertension   3. Chronic combined systolic and diastolic heart failure (HCC)   4. Orthostatic hypotension    PLAN:    In order of problems listed above:  1. Stable CAD, having no angina on current medical therapy, at this time especially in view of his debilitation and comorbidities I would continue medical therapy including anticoagulant beta-blocker and statin and do not advise an ischemia evaluation 2. Worsened with symptomatic hypotension orthostatic continues beta-blocker with CAD and increase midodrine to 4 times daily with ongoing hypotension 3. Heart failure is compensated continue minimum dose of diuretic. 4. Atrial fibrillation stable maintaining sinus rhythm 5. Chronic anticoagulation, this is problematic he has had multiple GI bleeds with hospitalization recent hematuria seen by urology recheck renal function and any abnormality I will reduce some to half dose Eliquis. 6.    Next appointment: 3 months   Medication Adjustments/Labs and Tests Ordered: Current medicines are reviewed at length with the patient today.  Concerns regarding medicines are outlined above.  Orders Placed This Encounter  Procedures  . Basic Metabolic Panel (BMET)  . Pro b natriuretic peptide (BNP)  . EKG 12-Lead   Meds ordered this encounter  Medications  . midodrine (PROAMATINE) 10 MG tablet    Sig: Take 1 tablet (10 mg total) by mouth 4 (four) times daily.    Dispense:  360 tablet    Refill:  0    Chief Complaint  Patient presents with  . Follow-up    History  of Present Illness:    Joshua Chambers is a 85 y.o. male with a hx of CAD CABG in 2007,  hypertension, orthostatic hypotension requiring midodrine therapy, APC's, and TIA and two recent hospitalization with GI bleed anemia and transfusion last seen 03/06/2019. Compliance with diet, lifestyle and medications: Yes  He is somewhat improved but still complains of weakness and orthostatic lightheadedness.  He has had no further rectal bleeding tells me his hemoglobin is normal but he has had episodes of hematuria and recently seen by urology it is cleared.  He has no edema orthopnea chest pain or syncope.  She will pulse was felt to be less than 50 EKG confirms an adequate heart rate Past Medical History:  Diagnosis Date  . Arteriosclerosis of arterial coronary artery bypass graft   . Arthritis   . Atrial contractions, premature   . Atrial fibrillation (HCC)   . Benign hypertension   . Bilateral carotid artery stenosis   . CAD (coronary artery disease), autologous vein bypass graft 04/19/2013   100% flush occlusion of SVG-OM & SVG-Diag; near Subtotal Occlusion of SVG-RCA (PCI of RCA done to avoid distal embolization with PCI to SVG)  . CAD (coronary artery disease), native coronary artery 2007   Multivessel CAD referred for CABG  . CAD S/P percutaneous coronary angioplasty  04/19/2013   PCI of Native RCA - Promus Premier DES 3.0 mm x 38 mm (3.6 mm);   [name] Chronic combined systolic and diastolic heart failure (HCC)   . Dyslipidemia   .  Dyslipidemia, goal LDL below 70   . GERD (gastroesophageal reflux disease)   . History of DVT of lower extremity    Right popliteal vein  . History of stroke August 2011   Possible TIA in 2001;  Echo: EF 45-50%, mild HK of anterior septum.  [name] History of transient cerebral ischemia   . Hx of orthostatic hypotension 2009   Positive tilt table:  dizziness/vasopressor type; previously on the decision   . Hx SBO   . Nephrolithiasis   . Orthostatic hypotension   .  S/P CABG x 3 2007   LIMA-LAD, SVG-DIAG, SVG-RCA  . Stroke ([place])   . Unsteady gait     Past Surgical History:  Procedure Laterality Date  . APPENDECTOMY    . CARDIAC SURGERY  2000s  . COLON SURGERY    . CORONARY ANGIOPLASTY WITH STENT PLACEMENT  04/19/13   Stent-Promus DES (3.0 mm x 38 mm - 3.6 mm) to native RCA prox-mid,  . CORONARY ARTERY BYPASS GRAFT  2007   LIMA-LAD, SVG-D1, SVG-RCA  . LEFT HEART CATHETERIZATION WITH CORONARY/GRAFT ANGIOGRAM Left 04/19/2013   LEFT HEART CATH W/ CORONARY/GRAFT ANGIOGRAM;  Leonie Man, MD;  Location: [facility] CATH LAB;  LM 20%-->LAD 70-80% then subTO after D1 (Patent LIMA-LAD); Small non-dom Cx ~20% ostial.  RCA long 60-70% prox --> 90% mid.  SVG-OM & SVG-Diag 100%, diffuse Subtotal occlusion of SVG-rPD  . STENT PLACEMENT VASCULAR ([facility] HX)    . TONSILLECTOMY      Current Medications: Current Meds  Medication Sig  . acetaminophen (TYLENOL) 500 MG tablet Take 1,000 mg by mouth at bedtime.  [name] apixaban (ELIQUIS) 5 MG TABS tablet Take 1 tablet (5 mg total) by mouth 2 (two) times daily.  [name] atorvastatin (LIPITOR) 40 MG tablet TAKE 0.5 TABLET BY MOUTH DAILY AT 6:00 PM  . dutasteride (AVODART) 0.5 MG capsule Take 0.5 mg by mouth daily.   . furosemide (LASIX) 20 MG tablet Take 20mg  (one tablet) on Mondays, Wednesdays, and Fridays  . metoprolol tartrate (LOPRESSOR) 25 MG tablet Take 0.5 tablets (12.5 mg total) by mouth daily.  . midodrine (PROAMATINE) 10 MG tablet Take 1 tablet (10 mg total) by mouth 4 (four) times daily.  . nitroGLYCERIN (NITROSTAT) 0.4 MG SL tablet Place 1 tablet (0.4 mg total) under the tongue every 5 (five) minutes as needed for chest pain.  . Polyvinyl Alcohol-Povidone (REFRESH OP) Place 1 drop into both eyes daily as needed.  [name] PULMICORT FLEXHALER 180 MCG/ACT inhaler Inhale 2 puffs into the lungs daily.   . [DISCONTINUED] midodrine (PROAMATINE) 10 MG tablet TAKE 1 TABLET(10 MG) BY MOUTH THREE TIMES DAILY     Allergies:   Amiodarone,  Diltiazem, Statins, and Sulfa antibiotics   Social History   Socioeconomic History  . Marital status: Married    Spouse name: Not on file  . Number of children: Not on file  . Years of education: Not on file  . Highest education level: Not on file  Occupational History  . Not on file  Social Needs  . Financial resource strain: Not on file  . Food insecurity    Worry: Not on file    Inability: Not on file  . Transportation needs    Medical: Not on file    Non-medical: Not on file  Tobacco Use  . Smoking status: Never Smoker  . Smokeless tobacco: Never Used  Substance and Sexual Activity  . Alcohol use: No  . Drug use: No  . Sexual  activity: Not on file  Lifestyle  . Physical activity    Days per week: Not on file    Minutes per session: Not on file  . Stress: Not on file  Relationships  . Social Musician on phone: Not on file    Gets together: Not on file    Attends religious service: Not on file    Active member of club or organization: Not on file    Attends meetings of clubs or organizations: Not on file    Relationship status: Not on file  Other Topics Concern  . Not on file  Social History Narrative    He is now remarried for the last 2 years, his first wife of 60 years is deceased. He has 1   child from that first marriage and 2 grandchildren.    He does not smoke, does not drink.    He is not very active but he does kind of meander around with his new wife who is permanently disabled.      Family History: The patient's family history includes Colon cancer in his father; Hypertension in his son. ROS:   Please see the history of present illness.    All other systems reviewed and are negative.  EKGs/Labs/Other Studies Reviewed:    The following studies were reviewed today:  EKG:  EKG ordered today and personally reviewed.  The ekg ordered today shows sinus rhythm atrial bigeminy heart rate 70 bpm nonspecific T wave the QT interval was read as  prolonged and it is not  Recent Labs: 03/06/2019: BUN 29; Creatinine, Ser 1.10; Hemoglobin 11.5; NT-Pro BNP 459; Platelets 318; Potassium 4.5; Sodium 142  Recent Lipid Panel    Component Value Date/Time   CHOL 172 03/17/2017 0006   TRIG 103 03/17/2017 0006   HDL 47 03/17/2017 0006   CHOLHDL 3.7 03/17/2017 0006   VLDL 21 03/17/2017 0006   LDLCALC 104 (H) 03/17/2017 0006    Physical Exam:    VS:  BP 100/72 (BP Location: Right Arm, Patient Position: Sitting, Cuff Size: Normal)   Pulse (!) 47   Ht 6\' 2"  (1.88 m)   Wt 197 lb (89.4 kg)   SpO2 95%   BMI 25.29 kg/m     Wt Readings from Last 3 Encounters:  05/17/19 197 lb (89.4 kg)  03/06/19 196 lb (88.9 kg)  02/13/19 195 lb 3.2 oz (88.5 kg)     GEN:  Well nourished, well developed in no acute distress HEENT: Normal NECK: No JVD; No carotid bruits LYMPHATICS: No lymphadenopathy CARDIAC: RRR, no murmurs, rubs, gallops RESPIRATORY:  Clear to auscultation without rales, wheezing or rhonchi  ABDOMEN: Soft, non-tender, non-distended MUSCULOSKELETAL:  No edema; No deformity  SKIN: Warm and dry NEUROLOGIC:  Alert and oriented x 3 PSYCHIATRIC:  Normal affect    Signed, Joshua Herrlich, MD  05/17/2019 11:59 AM    [facility]

## 2019-05-17 NOTE — Patient Instructions (Signed)
Medication Instructions:  Your physician has recommended you make the following change in your medication:   INCREASE midodrine (proamatine) 10 mg: Take 1 tablet four times daily   *If you need a refill on your cardiac medications before your next appointment, please call your pharmacy*  Lab Work: Your physician recommends that you return for lab work today: BMP, ProBNP.   If you have labs (blood work) drawn today and your tests are completely normal, you will receive your results only by: [name] MyChart Message (if you have MyChart) OR . A paper copy in the mail If you have any lab test that is abnormal or we need to change your treatment, we will call you to review the results.  Testing/Procedures: You had an EKG today.   Follow-Up: At [facility], you and your health needs are our priority.  As part of our continuing mission to provide you with exceptional heart care, we have created designated Provider Care Teams.  These Care Teams include your primary Cardiologist (physician) and Advanced Practice Providers (APPs -  Physician Assistants and Nurse Practitioners) who all work together to provide you with the care you need, when you need it.  Your next appointment:   3 months  The format for your next appointment:   In Person  Provider:   Shirlee More, MD

## 2019-05-18 LAB — BASIC METABOLIC PANEL WITH GFR
BUN/Creatinine Ratio: 24 (ref 10–24)
BUN: 25 mg/dL (ref 10–36)
CO2: 22 mmol/L (ref 20–29)
Calcium: 9.7 mg/dL (ref 8.6–10.2)
Chloride: 104 mmol/L (ref 96–106)
Creatinine, Ser: 1.03 mg/dL (ref 0.76–1.27)
GFR calc Af Amer: 72 mL/min/1.73
GFR calc non Af Amer: 62 mL/min/1.73
Glucose: 89 mg/dL (ref 65–99)
Potassium: 4.5 mmol/L (ref 3.5–5.2)
Sodium: 140 mmol/L (ref 134–144)

## 2019-05-18 LAB — PRO B NATRIURETIC PEPTIDE: NT-Pro BNP: 638 pg/mL — ABNORMAL HIGH (ref 0–486)

## 2019-05-19 ENCOUNTER — Other Ambulatory Visit: Payer: Self-pay | Admitting: Cardiology

## 2019-05-20 DIAGNOSIS — R1310 Dysphagia, unspecified: Secondary | ICD-10-CM | POA: Diagnosis not present

## 2019-05-20 DIAGNOSIS — K224 Dyskinesia of esophagus: Secondary | ICD-10-CM | POA: Diagnosis not present

## 2019-06-11 DIAGNOSIS — I10 Essential (primary) hypertension: Secondary | ICD-10-CM | POA: Diagnosis not present

## 2019-06-11 DIAGNOSIS — K2970 Gastritis, unspecified, without bleeding: Secondary | ICD-10-CM | POA: Diagnosis not present

## 2019-06-11 DIAGNOSIS — K2210 Ulcer of esophagus without bleeding: Secondary | ICD-10-CM | POA: Diagnosis not present

## 2019-06-11 DIAGNOSIS — R1310 Dysphagia, unspecified: Secondary | ICD-10-CM | POA: Diagnosis not present

## 2019-06-11 DIAGNOSIS — K2971 Gastritis, unspecified, with bleeding: Secondary | ICD-10-CM | POA: Diagnosis not present

## 2019-06-11 DIAGNOSIS — K222 Esophageal obstruction: Secondary | ICD-10-CM | POA: Diagnosis not present

## 2019-07-19 ENCOUNTER — Other Ambulatory Visit: Payer: Self-pay | Admitting: Cardiology

## 2019-08-19 ENCOUNTER — Other Ambulatory Visit: Payer: Self-pay

## 2019-08-19 DIAGNOSIS — N200 Calculus of kidney: Secondary | ICD-10-CM | POA: Diagnosis not present

## 2019-08-19 DIAGNOSIS — R310 Gross hematuria: Secondary | ICD-10-CM | POA: Diagnosis not present

## 2019-08-19 DIAGNOSIS — N281 Cyst of kidney, acquired: Secondary | ICD-10-CM | POA: Diagnosis not present

## 2019-08-19 DIAGNOSIS — N401 Enlarged prostate with lower urinary tract symptoms: Secondary | ICD-10-CM | POA: Diagnosis not present

## 2019-08-19 DIAGNOSIS — R351 Nocturia: Secondary | ICD-10-CM | POA: Diagnosis not present

## 2019-08-19 MED ORDER — METOPROLOL TARTRATE 25 MG PO TABS: ORAL_TABLET | ORAL | 0 refills | Status: DC

## 2019-08-25 ENCOUNTER — Other Ambulatory Visit: Payer: Self-pay | Admitting: Cardiology

## 2019-09-25 DIAGNOSIS — H5231 Anisometropia: Secondary | ICD-10-CM | POA: Diagnosis not present

## 2019-09-25 DIAGNOSIS — H3530 Unspecified macular degeneration: Secondary | ICD-10-CM | POA: Diagnosis not present

## 2019-09-25 DIAGNOSIS — Z9849 Cataract extraction status, unspecified eye: Secondary | ICD-10-CM | POA: Diagnosis not present

## 2019-09-25 DIAGNOSIS — H524 Presbyopia: Secondary | ICD-10-CM | POA: Diagnosis not present

## 2019-09-25 DIAGNOSIS — H35363 Drusen (degenerative) of macula, bilateral: Secondary | ICD-10-CM | POA: Diagnosis not present

## 2019-09-25 DIAGNOSIS — Z961 Presence of intraocular lens: Secondary | ICD-10-CM | POA: Diagnosis not present

## 2019-09-25 DIAGNOSIS — H52223 Regular astigmatism, bilateral: Secondary | ICD-10-CM | POA: Diagnosis not present

## 2019-09-25 DIAGNOSIS — H353132 Nonexudative age-related macular degeneration, bilateral, intermediate dry stage: Secondary | ICD-10-CM | POA: Diagnosis not present

## 2019-09-26 ENCOUNTER — Other Ambulatory Visit: Payer: Self-pay | Admitting: Cardiology

## 2019-10-07 NOTE — Progress Notes (Signed)
Cardiology Office Note:    Date:  10/08/2019   ID:  Joshua Chambers, Joshua Chambers Nov 23, 1922, MRN [id number]  PCP:  Street, Joshua Coup, MD  Cardiologist:  Norman Herrlich, MD    Referring MD: [address] Joshua Chambers, *    ASSESSMENT:    1. Coronary artery disease involving coronary bypass graft of native heart without angina pectoris; CAD (coronary artery disease) of artery bypass graft:  loss of VG to RCA and VG-Diag   2. Chronic combined systolic and diastolic heart failure (HCC)   3. Hypertensive heart disease with chronic combined systolic and diastolic congestive heart failure (HCC)   4. Orthostatic hypotension   5. PAF (paroxysmal atrial fibrillation) (HCC)   6. Chronic anticoagulation    PLAN:    In order of problems listed above:  1. Stable.  Heart Association class I continue current medical treatment including low-dose beta-blocker lipid-lowering with a statin and his anticoagulant.  At this time I would not pursue an ischemic evaluation 2. Heart failure is compensated he takes low-dose diuretic and has no edema we will continue the same [place] Heart Association class. 3. BP at target continue low-dose midodrine with symptomatic orthostatic hypotension 4. Stable no clinical recurrence continue low-dose beta-blocker anticoagulant 5. Continue his anticoagulant without bleeding complication 6. Clinically he has carpal tunnel syndrome and if symptoms progress referred for nerve conduction studies.   Next appointment: 6 months   Medication Adjustments/Labs and Tests Ordered: Current medicines are reviewed at length with the patient today.  Concerns regarding medicines are outlined above.  No orders of the defined types were placed in this encounter.  No orders of the defined types were placed in this encounter.   No chief complaint on file.   History of Present Illness:    Joshua Chambers is a 86 y.o. male with a hx of CAD CABG in 2007,  hypertension, orthostatic hypotension  requiring midodrine therapy, APC's, and TIA and two hospitalization with GI bleed anemia and transfusion  last seen 05/17/2019. Compliance with diet, lifestyle and medications: Yes  He is doing better he is not having orthostatic lightheadedness on low-dose midodrine.  His anticoagulant has had no recurrent GI bleeding.  He has a history of esophageal stricture is having some trouble swallowing but not severe.  His predominant complaint is numbness of the hands which is intermittent and not consistent with carpal tunnel syndrome.  He declined referral for nerve conduction study.  He has had no angina shortness of breath edema palpitation or syncope he tolerates lipid-lowering treatment without muscle pain or weakness he is compliant with his diuretic.  Most recent labs 07/10/2019 cholesterol 154 HDL 46 LDL 75 creatinine normal 0.0 Past Medical History:  Diagnosis Date  . Arteriosclerosis of arterial coronary artery bypass graft   . Arthritis   . Atrial contractions, premature   . Atrial fibrillation (HCC)   . Benign hypertension   . Bilateral carotid artery stenosis   . CAD (coronary artery disease), autologous vein bypass graft 04/19/2013   100% flush occlusion of SVG-OM & SVG-Diag; near Subtotal Occlusion of SVG-RCA (PCI of RCA done to avoid distal embolization with PCI to SVG)  . CAD (coronary artery disease), native coronary artery 2007   Multivessel CAD referred for CABG  . CAD S/P percutaneous coronary angioplasty  04/19/2013   PCI of Native RCA - Promus Premier DES 3.0 mm x 38 mm (3.6 mm);   [name] Chronic combined systolic and diastolic heart failure (HCC)   . Dyslipidemia   .  Dyslipidemia, goal LDL below 70   . GERD (gastroesophageal reflux disease)   . History of DVT of lower extremity    Right popliteal vein  . History of stroke August 2011   Possible TIA in 2001;  Echo: EF 45-50%, mild HK of anterior septum.  [name] History of transient cerebral ischemia   . Hx of orthostatic hypotension  2009   Positive tilt table:  dizziness/vasopressor type; previously on the decision   . Hx SBO   . Nephrolithiasis   . Orthostatic hypotension   . S/P CABG x 3 2007   LIMA-LAD, SVG-DIAG, SVG-RCA  . Stroke (HCC)   . Unsteady gait     Past Surgical History:  Procedure Laterality Date  . APPENDECTOMY    . CARDIAC SURGERY  2000s  . COLON SURGERY    . CORONARY ANGIOPLASTY WITH STENT PLACEMENT  04/19/13   Stent-Promus DES (3.0 mm x 38 mm - 3.6 mm) to native RCA prox-mid,  . CORONARY ARTERY BYPASS GRAFT  2007   LIMA-LAD, SVG-D1, SVG-RCA  . LEFT HEART CATHETERIZATION WITH CORONARY/GRAFT ANGIOGRAM Left 04/19/2013   LEFT HEART CATH W/ CORONARY/GRAFT ANGIOGRAM;  [name], MD;  Location: [facility] CATH LAB;  LM 20%-->LAD 70-80% then subTO after D1 (Patent LIMA-LAD); Small non-dom Cx ~20% ostial.  RCA long 60-70% prox --> 90% mid.  SVG-OM & SVG-Diag 100%, diffuse Subtotal occlusion of SVG-rPD  . STENT PLACEMENT VASCULAR ([facility] HX)    . TONSILLECTOMY      Current Medications: Current Meds  Medication Sig  . acetaminophen (TYLENOL) 500 MG tablet Take 1,000 mg by mouth at bedtime.  [name] atorvastatin (LIPITOR) 40 MG tablet TAKE 0.5 TABLET BY MOUTH DAILY AT 6:00 PM  . dutasteride (AVODART) 0.5 MG capsule Take 0.5 mg by mouth daily.   [name] ELIQUIS 5 MG TABS tablet TAKE 1 TABLET(5 MG) BY MOUTH TWICE DAILY  . furosemide (LASIX) 20 MG tablet TAKE 1 TABLET(20 MG) BY MOUTH DAILY  . metoprolol tartrate (LOPRESSOR) 25 MG tablet TAKE 1/2 TABLET(12.5 MG) BY MOUTH TWICE DAILY  . midodrine (PROAMATINE) 10 MG tablet Take 1 tablet (10 mg total) by mouth in the morning, at noon, in the evening, and at bedtime.  . nitroGLYCERIN (NITROSTAT) 0.4 MG SL tablet Place 1 tablet (0.4 mg total) under the tongue every 5 (five) minutes as needed for chest pain.  . Polyvinyl Alcohol-Povidone (REFRESH OP) Place 1 drop into both eyes daily as needed.  [name] PULMICORT FLEXHALER 180 MCG/ACT inhaler Inhale 2 puffs into the lungs daily.       Allergies:   Amiodarone, Diltiazem, Statins, and Sulfa antibiotics   Social History   Socioeconomic History  . Marital status: Married    Spouse name: Not on file  . Number of children: Not on file  . Years of education: Not on file  . Highest education level: Not on file  Occupational History  . Not on file  Tobacco Use  . Smoking status: Never Smoker  . Smokeless tobacco: Never Used  Substance and Sexual Activity  . Alcohol use: No  . Drug use: No  . Sexual activity: Not on file  Other Topics Concern  . Not on file  Social History Narrative    He is now remarried for the last 2 years, his first wife of 60 years is deceased. He has 1   child from that first marriage and 2 grandchildren.    He does not smoke, does not drink.    He is  not very active but he does kind of meander around with his new wife who is permanently disabled.    Social Determinants of Health   Financial Resource Strain:   . Difficulty of Paying Living Expenses:   Food Insecurity:   . Worried About Charity fundraiser in the Last Year:   . Arboriculturist in the Last Year:   Transportation Needs:   . Film/video editor (Medical):   [name] Lack of Transportation (Non-Medical):   Physical Activity:   . Days of Exercise per Week:   . Minutes of Exercise per Session:   Stress:   . Feeling of Stress :   Social Connections:   . Frequency of Communication with Friends and Family:   . Frequency of Social Gatherings with Friends and Family:   . Attends Religious Services:   . Active Member of Clubs or Organizations:   . Attends Archivist Meetings:   [name] Marital Status:      Family History: The patient's family history includes Colon cancer in his father; Hypertension in his son. ROS:   Please see the history of present illness.    All other systems reviewed and are negative.  EKGs/Labs/Other Studies Reviewed:    The following studies were reviewed today:    Recent Labs: 03/06/2019:  Hemoglobin 11.5; Platelets 318 05/17/2019: BUN 25; Creatinine, Ser 1.03; NT-Pro BNP 638; Potassium 4.5; Sodium 140  Recent Lipid Panel    Component Value Date/Time   CHOL 172 03/17/2017 0006   TRIG 103 03/17/2017 0006   HDL 47 03/17/2017 0006   CHOLHDL 3.7 03/17/2017 0006   VLDL 21 03/17/2017 0006   LDLCALC 104 (H) 03/17/2017 0006    Physical Exam:    VS:  BP 110/76   Pulse (!) 51   Ht 6\' 2"  (1.88 m)   Wt 202 lb 12.8 oz (92 kg)   SpO2 100%   BMI 26.04 kg/m     Wt Readings from Last 3 Encounters:  10/08/19 202 lb 12.8 oz (92 kg)  05/17/19 197 lb (89.4 kg)  03/06/19 196 lb (88.9 kg)     GEN: Elderly appears his age cognitively intact well nourished, well developed in no acute distress HEENT: Normal NECK: No JVD; No carotid bruits LYMPHATICS: No lymphadenopathy CARDIAC: RRR, no murmurs, rubs, gallops RESPIRATORY:  Clear to auscultation without rales, wheezing or rhonchi  ABDOMEN: Soft, non-tender, non-distended MUSCULOSKELETAL:  No edema; No deformity  SKIN: Warm and dry NEUROLOGIC:  Alert and oriented x 3 PSYCHIATRIC:  Normal affect    Signed, Shirlee More, MD  10/08/2019 8:42 AM     [facility]

## 2019-10-08 ENCOUNTER — Ambulatory Visit (INDEPENDENT_AMBULATORY_CARE_PROVIDER_SITE_OTHER): Payer: Medicare Other | Admitting: Cardiology

## 2019-10-08 ENCOUNTER — Encounter: Payer: Self-pay | Admitting: Cardiology

## 2019-10-08 ENCOUNTER — Other Ambulatory Visit: Payer: Self-pay

## 2019-10-08 VITALS — BP 110/76 | HR 51 | Ht 74.0 in | Wt 202.8 lb

## 2019-10-08 DIAGNOSIS — I110 Hypertensive heart disease with heart failure: Secondary | ICD-10-CM | POA: Diagnosis not present

## 2019-10-08 DIAGNOSIS — I25810 Atherosclerosis of coronary artery bypass graft(s) without angina pectoris: Secondary | ICD-10-CM

## 2019-10-08 DIAGNOSIS — Z7901 Long term (current) use of anticoagulants: Secondary | ICD-10-CM

## 2019-10-08 DIAGNOSIS — I951 Orthostatic hypotension: Secondary | ICD-10-CM

## 2019-10-08 DIAGNOSIS — I480 Paroxysmal atrial fibrillation: Secondary | ICD-10-CM | POA: Diagnosis not present

## 2019-10-08 DIAGNOSIS — I5042 Chronic combined systolic (congestive) and diastolic (congestive) heart failure: Secondary | ICD-10-CM | POA: Diagnosis not present

## 2019-10-08 NOTE — Patient Instructions (Signed)

## 2019-11-17 ENCOUNTER — Other Ambulatory Visit: Payer: Self-pay | Admitting: Cardiology

## 2019-11-29 DIAGNOSIS — H9110 Presbycusis, unspecified ear: Secondary | ICD-10-CM | POA: Diagnosis not present

## 2019-11-29 DIAGNOSIS — I25810 Atherosclerosis of coronary artery bypass graft(s) without angina pectoris: Secondary | ICD-10-CM | POA: Diagnosis not present

## 2019-11-29 DIAGNOSIS — H6121 Impacted cerumen, right ear: Secondary | ICD-10-CM | POA: Diagnosis not present

## 2019-11-29 DIAGNOSIS — H9313 Tinnitus, bilateral: Secondary | ICD-10-CM | POA: Diagnosis not present

## 2019-12-24 ENCOUNTER — Other Ambulatory Visit: Payer: Self-pay | Admitting: Cardiology

## 2020-02-15 ENCOUNTER — Other Ambulatory Visit: Payer: Self-pay | Admitting: Cardiology

## 2020-02-16 ENCOUNTER — Other Ambulatory Visit: Payer: Self-pay | Admitting: Cardiology

## 2020-02-17 ENCOUNTER — Other Ambulatory Visit: Payer: Self-pay

## 2020-04-06 DIAGNOSIS — I10 Essential (primary) hypertension: Secondary | ICD-10-CM | POA: Insufficient documentation

## 2020-04-06 DIAGNOSIS — E785 Hyperlipidemia, unspecified: Secondary | ICD-10-CM | POA: Insufficient documentation

## 2020-04-06 DIAGNOSIS — Z8719 Personal history of other diseases of the digestive system: Secondary | ICD-10-CM | POA: Insufficient documentation

## 2020-04-06 DIAGNOSIS — I491 Atrial premature depolarization: Secondary | ICD-10-CM | POA: Insufficient documentation

## 2020-04-06 DIAGNOSIS — I4891 Unspecified atrial fibrillation: Secondary | ICD-10-CM | POA: Insufficient documentation

## 2020-04-06 DIAGNOSIS — Z8673 Personal history of transient ischemic attack (TIA), and cerebral infarction without residual deficits: Secondary | ICD-10-CM | POA: Insufficient documentation

## 2020-04-06 DIAGNOSIS — Z86718 Personal history of other venous thrombosis and embolism: Secondary | ICD-10-CM | POA: Insufficient documentation

## 2020-04-06 DIAGNOSIS — I6523 Occlusion and stenosis of bilateral carotid arteries: Secondary | ICD-10-CM | POA: Insufficient documentation

## 2020-04-06 DIAGNOSIS — N200 Calculus of kidney: Secondary | ICD-10-CM | POA: Insufficient documentation

## 2020-04-06 DIAGNOSIS — K219 Gastro-esophageal reflux disease without esophagitis: Secondary | ICD-10-CM | POA: Insufficient documentation

## 2020-04-06 DIAGNOSIS — R2681 Unsteadiness on feet: Secondary | ICD-10-CM | POA: Insufficient documentation

## 2020-04-06 DIAGNOSIS — I25810 Atherosclerosis of coronary artery bypass graft(s) without angina pectoris: Secondary | ICD-10-CM | POA: Insufficient documentation

## 2020-04-06 DIAGNOSIS — M1990 Unspecified osteoarthritis, unspecified site: Secondary | ICD-10-CM | POA: Insufficient documentation

## 2020-04-06 DIAGNOSIS — I639 Cerebral infarction, unspecified: Secondary | ICD-10-CM | POA: Insufficient documentation

## 2020-04-06 NOTE — Progress Notes (Signed)
Cardiology Office Note:    Date:  04/07/2020   ID:  Joshua, Chambers February 15, 1923, MRN [id number]  PCP:  Street, Stephanie Coup, MD  Cardiologist:  Norman Herrlich, MD    Referring MD: [address] Stephanie Coup, *    ASSESSMENT:    1. Coronary artery disease involving coronary bypass graft of native heart without angina pectoris; CAD (coronary artery disease) of artery bypass graft:  loss of VG to RCA and VG-Diag   2. Hypertensive heart disease with chronic combined systolic and diastolic congestive heart failure (HCC)   3. Orthostatic hypotension   4. Persistent atrial fibrillation (HCC)   5. Chronic anticoagulation   6. Dyslipidemia, goal LDL below 70    PLAN:    In order of problems listed above:  1. In general is done well stable CAD having no angina continue current medical treatment including anticoagulant statin beta-blocker. 2. Blood pressure target heart failure compensated continue current diuretic 3. Continue midodrine he has had severe symptomatic orthostatic hypotension in the past 4. Recheck lipid profile liver function   Next appointment: 6 months   Medication Adjustments/Labs and Tests Ordered: Current medicines are reviewed at length with the patient today.  Concerns regarding medicines are outlined above.  No orders of the defined types were placed in this encounter.  No orders of the defined types were placed in this encounter.   Chief Complaint  Patient presents with  . Follow-up  . Coronary Artery Disease  . Congestive Heart Failure  . Hypotension  . Atrial Fibrillation    History of Present Illness:    Joshua Chambers is a 86 y.o. male with a hx of CAD with remote CABG 2007, hypertensive heart disease, symptomatic orthostatic hypotension requiring midodrine therapy, APCs TIA in 2 hospitalizations requiring transfusion with GI bleeding and anemia last seen 10/08/2019. Compliance with diet, lifestyle and medications: Yes he tolerates his anticoagulant  without bleeding.  In general he is done well does not have symptomatic orthostatic hypotension.  He tells me about once a month he just feels weekly he had some drooling today and he was weak particularly in the right arm and.  He also has cervical spine disc disease he thought he had a stroke the symptoms resolved.  He is a good compliant with his anticoagulant has had no angina edema shortness of breath and no syncope.  He was in atrial fib a year ago when last seen I think he is in it we will check an EKG today.  We discussed the option of combining antiplatelet and anticoagulant but I think is a poor choice for him with previous GI bleed. Past Medical History:  Diagnosis Date  . Arteriosclerosis of arterial coronary artery bypass graft   . Arthritis   . Atrial contractions, premature   . Atrial fibrillation (HCC)   . Benign hypertension   . Bilateral carotid artery stenosis   . CAD (coronary artery disease), autologous vein bypass graft 04/19/2013   100% flush occlusion of SVG-OM & SVG-Diag; near Subtotal Occlusion of SVG-RCA (PCI of RCA done to avoid distal embolization with PCI to SVG)  . CAD (coronary artery disease), native coronary artery 2007   Multivessel CAD referred for CABG  . CAD S/P percutaneous coronary angioplasty  04/19/2013   PCI of Native RCA - Promus Premier DES 3.0 mm x 38 mm (3.6 mm);   [name] Chronic anticoagulation 04/17/2018  . Chronic combined systolic and diastolic heart failure (HCC)   . Coronary artery disease involving  coronary bypass graft of native heart without angina pectoris; CAD (coronary artery disease) of artery bypass graft:  loss of VG to RCA and VG-Diag 04/17/2013  . Dyslipidemia   . Dyslipidemia, goal LDL below 70   . Dyspnea 04/30/2013  . Essential hypertension 05/02/2013  . Fatigue 04/17/2013  . GERD (gastroesophageal reflux disease)   . Hematuria 05/02/2013  . History of DVT of lower extremity    Right popliteal vein  . History of stroke August  2011   Possible TIA in 2001;  Echo: EF 45-50%, mild HK of anterior septum.  [name] History of transient cerebral ischemia   . Hx of orthostatic hypotension 2009   Positive tilt table:  dizziness/vasopressor type; previously on the decision   . Hx SBO   . Ischemic cardiomyopathy 02/06/2018  . Nephrolithiasis   . Orthostatic hypotension   . Other and unspecified coagulation defects 04/17/2013  . PAF (paroxysmal atrial fibrillation) (HCC) 02/06/2018  . Premature atrial contractions 07/28/2015  . Preoperative examination, unspecified 04/17/2013  . S/P CABG x 3 2007   LIMA-LAD, SVG-DIAG, SVG-RCA  . S/P CABG x 4; LIMA-LAD, SVG-RPDA, SVG-OM, SVG-D1 -- all but LIMA-LAD occluded 03/18/2006   03/2006 - LIMA-LAD, SVG-D1, SVG-RPDA   . Sinus arrhythmia 09/09/2016  . Stroke (HCC)   . TIA (transient ischemic attack) 03/16/2017  . Unsteady gait     Past Surgical History:  Procedure Laterality Date  . APPENDECTOMY    . CARDIAC SURGERY  2000s  . COLON SURGERY    . CORONARY ANGIOPLASTY WITH STENT PLACEMENT  04/19/13   Stent-Promus DES (3.0 mm x 38 mm - 3.6 mm) to native RCA prox-mid,  . CORONARY ARTERY BYPASS GRAFT  2007   LIMA-LAD, SVG-D1, SVG-RCA  . LEFT HEART CATHETERIZATION WITH CORONARY/GRAFT ANGIOGRAM Left 04/19/2013   LEFT HEART CATH W/ CORONARY/GRAFT ANGIOGRAM;  [name], MD;  Location: [facility] C[facility] LAB;  LM 20%-->LAD 70-80% then subTO after D1 (Patent LIMA-LAD); Small non-dom Cx ~20% ostial.  RCA long 60-70% prox --> 90% mid.  SVG-OM & SVG-Diag 100%, diffuse Subtotal occlusion of SVG-rPD  . STENT PLACEMENT VASCULAR ([facility] HX)    . TONSILLECTOMY      Current Medications: Current Meds  Medication Sig  . acetaminophen (TYLENOL) 500 MG tablet Take 1,000 mg by mouth at bedtime.  [name] atorvastatin (LIPITOR) 40 MG tablet TAKE 1/2 OF A TABLET BY MOUTH DAILY AT 6:00PM  . dutasteride (AVODART) 0.5 MG capsule Take 0.5 mg by mouth daily.   [name] ELIQUIS 5 MG TABS tablet TAKE 1 TABLET(5 MG) BY MOUTH TWICE DAILY  .  furosemide (LASIX) 20 MG tablet TAKE 1 TABLET(20 MG) BY MOUTH DAILY ON MONDAY, WEDNESDAY, AND FRIDAYS  . metoprolol tartrate (LOPRESSOR) 25 MG tablet TAKE 1/2 TABLET(12.5 MG) BY MOUTH TWICE DAILY  . midodrine (PROAMATINE) 10 MG tablet Take 1 tablet (10 mg total) by mouth in the morning, at noon, in the evening, and at bedtime.  [name] neomycin-polymyxin-hydrocortisone (CORTISPORIN) OTIC solution SMARTSIG:4 Drop(s) Right Ear 3 Times Daily  . nitroGLYCERIN (NITROSTAT) 0.4 MG SL tablet Place 1 tablet (0.4 mg total) under the tongue every 5 (five) minutes as needed for chest pain.  [name] omeprazole (PRILOSEC) 20 MG capsule Take 20 mg by mouth every morning.  . Polyvinyl Alcohol-Povidone (REFRESH OP) Place 1 drop into both eyes daily as needed.  [name] PULMICORT FLEXHALER 180 MCG/ACT inhaler Inhale 2 puffs into the lungs daily.      Allergies:   Amiodarone, Diltiazem, Statins, and Sulfa antibiotics  Social History   Socioeconomic History  . Marital status: Married    Spouse name: Not on file  . Number of children: Not on file  . Years of education: Not on file  . Highest education level: Not on file  Occupational History  . Not on file  Tobacco Use  . Smoking status: Never Smoker  . Smokeless tobacco: Never Used  Vaping Use  . Vaping Use: Never used  Substance and Sexual Activity  . Alcohol use: No  . Drug use: No  . Sexual activity: Not on file  Other Topics Concern  . Not on file  Social History Narrative    He is now remarried for the last 2 years, his first wife of 60 years is deceased. He has 1   child from that first marriage and 2 grandchildren.    He does not smoke, does not drink.    He is not very active but he does kind of meander around with his new wife who is permanently disabled.    Social Determinants of Health   Financial Resource Strain:   . Difficulty of Paying Living Expenses: Not on file  Food Insecurity:   . Worried About Programme researcher, broadcasting/film/video in the Last Year: Not on  file  . Ran Out of Food in the Last Year: Not on file  Transportation Needs:   . Lack of Transportation (Medical): Not on file  . Lack of Transportation (Non-Medical): Not on file  Physical Activity:   . Days of Exercise per Week: Not on file  . Minutes of Exercise per Session: Not on file  Stress:   . Feeling of Stress : Not on file  Social Connections:   . Frequency of Communication with Friends and Family: Not on file  . Frequency of Social Gatherings with Friends and Family: Not on file  . Attends Religious Services: Not on file  . Active Member of Clubs or Organizations: Not on file  . Attends Banker Meetings: Not on file  . Marital Status: Not on file     Family History: The patient's family history includes Colon cancer in his father; Hypertension in his son. ROS:   Please see the history of present illness.    All other systems reviewed and are negative.  EKGs/Labs/Other Studies Reviewed:    The following studies were reviewed today:  EKG:  EKG ordered today and personally reviewed.  The ekg ordered today demonstrates today he is in sinus rhythm with APCs otherwise normal. Recent Labs: 05/17/2019: BUN 25; Creatinine, Ser 1.03; NT-Pro BNP 638; Potassium 4.5; Sodium 140  Recent Lipid Panel    Component Value Date/Time   CHOL 172 03/17/2017 0006   TRIG 103 03/17/2017 0006   HDL 47 03/17/2017 0006   CHOLHDL 3.7 03/17/2017 0006   VLDL 21 03/17/2017 0006   LDLCALC 104 (H) 03/17/2017 0006    Physical Exam:    VS:  BP 138/85   Pulse 78   Ht 6\' 2"  (1.88 m)   Wt 201 lb 3.2 oz (91.3 kg)   SpO2 98%   BMI 25.83 kg/m     Wt Readings from Last 3 Encounters:  04/07/20 201 lb 3.2 oz (91.3 kg)  10/08/19 202 lb 12.8 oz (92 kg)  05/17/19 197 lb (89.4 kg)     GEN: He looks frail well nourished, well developed in no acute distress HEENT: Normal NECK: No JVD; No carotid bruits LYMPHATICS: No lymphadenopathy CARDIAC: Irregular S1 variable RRR,  no murmurs,  rubs, gallops RESPIRATORY:  Clear to auscultation without rales, wheezing or rhonchi  ABDOMEN: Soft, non-tender, non-distended MUSCULOSKELETAL:  No edema; No deformity  SKIN: Warm and dry NEUROLOGIC:  Alert and oriented x 3 PSYCHIATRIC:  Normal affect    Signed, Norman Herrlich, MD  04/07/2020 9:57 AM    [facility]

## 2020-04-07 ENCOUNTER — Encounter: Payer: Self-pay | Admitting: Cardiology

## 2020-04-07 ENCOUNTER — Other Ambulatory Visit: Payer: Self-pay

## 2020-04-07 ENCOUNTER — Ambulatory Visit (INDEPENDENT_AMBULATORY_CARE_PROVIDER_SITE_OTHER): Payer: Medicare Other | Admitting: Cardiology

## 2020-04-07 VITALS — BP 138/85 | HR 78 | Ht 74.0 in | Wt 201.2 lb

## 2020-04-07 DIAGNOSIS — I25810 Atherosclerosis of coronary artery bypass graft(s) without angina pectoris: Secondary | ICD-10-CM | POA: Diagnosis not present

## 2020-04-07 DIAGNOSIS — I480 Paroxysmal atrial fibrillation: Secondary | ICD-10-CM

## 2020-04-07 DIAGNOSIS — I951 Orthostatic hypotension: Secondary | ICD-10-CM | POA: Diagnosis not present

## 2020-04-07 DIAGNOSIS — Z7901 Long term (current) use of anticoagulants: Secondary | ICD-10-CM

## 2020-04-07 DIAGNOSIS — E785 Hyperlipidemia, unspecified: Secondary | ICD-10-CM

## 2020-04-07 DIAGNOSIS — I110 Hypertensive heart disease with heart failure: Secondary | ICD-10-CM | POA: Diagnosis not present

## 2020-04-07 DIAGNOSIS — I5042 Chronic combined systolic (congestive) and diastolic (congestive) heart failure: Secondary | ICD-10-CM | POA: Diagnosis not present

## 2020-04-07 NOTE — Patient Instructions (Addendum)
Medication Instructions:  Your physician recommends that you continue on your current medications as directed. Please refer to the Current Medication list given to you today.   *If you need a refill on your cardiac medications before your next appointment, please call your pharmacy*   Lab Work: Cmp, Lipids   If you have labs (blood work) drawn today and your tests are completely normal, you will receive your results only by: [name] MyChart Message (if you have MyChart) OR . A paper copy in the mail If you have any lab test that is abnormal or we need to change your treatment, we will call you to review the results.   Testing/Procedures: None ordered    Follow-Up: At [facility], you and your health needs are our priority.  As part of our continuing mission to provide you with exceptional heart care, we have created designated Provider Care Teams.  These Care Teams include your primary Cardiologist (physician) and Advanced Practice Providers (APPs -  Physician Assistants and Nurse Practitioners) who all work together to provide you with the care you need, when you need it.  We recommend signing up for the patient portal called "MyChart".  Sign up information is provided on this After Visit Summary.  MyChart is used to connect with patients for Virtual Visits (Telemedicine).  Patients are able to view lab/test results, encounter notes, upcoming appointments, etc.  Non-urgent messages can be sent to your provider as well.   To learn more about what you can do with MyChart, go to [email or web address].    Your next appointment:   6 month(s)  The format for your next appointment:   In Person  Provider:   Norman Herrlich, MD   Other Instructions None

## 2020-04-12 ENCOUNTER — Other Ambulatory Visit: Payer: Self-pay | Admitting: Cardiology

## 2020-04-13 NOTE — Telephone Encounter (Signed)
Please review for refill, Thanks !  

## 2020-04-13 NOTE — Telephone Encounter (Signed)
Pt's age 86, wt 91.3 kg, last ov w/ BM 04/07/20. Cannot calculate CrCl, as pt is overdue for labs. These were ordered at last ov, so will send in 30 day supply so pt can get these done.

## 2020-04-15 ENCOUNTER — Other Ambulatory Visit: Payer: Self-pay | Admitting: Cardiology

## 2020-04-16 DIAGNOSIS — I25810 Atherosclerosis of coronary artery bypass graft(s) without angina pectoris: Secondary | ICD-10-CM | POA: Diagnosis not present

## 2020-04-16 DIAGNOSIS — E785 Hyperlipidemia, unspecified: Secondary | ICD-10-CM | POA: Diagnosis not present

## 2020-04-16 DIAGNOSIS — Z23 Encounter for immunization: Secondary | ICD-10-CM | POA: Diagnosis not present

## 2020-04-16 LAB — COMPREHENSIVE METABOLIC PANEL
ALT: 9 IU/L (ref 0–44)
AST: 16 IU/L (ref 0–40)
Albumin/Globulin Ratio: 2 (ref 1.2–2.2)
Albumin: 4.5 g/dL (ref 3.5–4.6)
Alkaline Phosphatase: 78 IU/L (ref 44–121)
BUN/Creatinine Ratio: 23 (ref 10–24)
BUN: 26 mg/dL (ref 10–36)
Bilirubin Total: 0.6 mg/dL (ref 0.0–1.2)
CO2: 23 mmol/L (ref 20–29)
Calcium: 9.5 mg/dL (ref 8.6–10.2)
Chloride: 103 mmol/L (ref 96–106)
Creatinine, Ser: 1.11 mg/dL (ref 0.76–1.27)
GFR calc Af Amer: 65 mL/min/{1.73_m2} (ref >59)
GFR calc non Af Amer: 57 mL/min/{1.73_m2} — ABNORMAL LOW (ref >59)
Globulin, Total: 2.3 g/dL (ref 1.5–4.5)
Glucose: 116 mg/dL — ABNORMAL HIGH (ref 65–99)
Potassium: 4.4 mmol/L (ref 3.5–5.2)
Sodium: 141 mmol/L (ref 134–144)
Total Protein: 6.8 g/dL (ref 6.0–8.5)

## 2020-04-16 LAB — LIPID PANEL
Chol/HDL Ratio: 3.7 ratio (ref 0.0–5.0)
Cholesterol, Total: 168 mg/dL (ref 100–199)
HDL: 46 mg/dL (ref >39)
LDL Chol Calc (NIH): 96 mg/dL (ref 0–99)
Triglycerides: 147 mg/dL (ref 0–149)
VLDL Cholesterol Cal: 26 mg/dL (ref 5–40)

## 2020-04-17 ENCOUNTER — Telehealth: Payer: Self-pay

## 2020-04-17 NOTE — Telephone Encounter (Signed)
Spoke with patient regarding results and recommendation.  Patient verbalizes understanding and is agreeable to plan of care. Advised patient to call back with any issues or concerns.  

## 2020-04-17 NOTE — Telephone Encounter (Signed)
-----   Message from Baldo Daub, MD sent at 04/16/2020  5:04 PM EDT ----- His results are quite good, no changes

## 2020-04-17 NOTE — Telephone Encounter (Signed)
Patient returning call.

## 2020-04-17 NOTE — Telephone Encounter (Signed)
Left message on patients voicemail to please return our call.   

## 2020-05-31 ENCOUNTER — Other Ambulatory Visit: Payer: Self-pay | Admitting: Cardiology

## 2020-06-20 ENCOUNTER — Other Ambulatory Visit: Payer: Self-pay | Admitting: Cardiology

## 2020-07-13 DIAGNOSIS — I25709 Atherosclerosis of coronary artery bypass graft(s), unspecified, with unspecified angina pectoris: Secondary | ICD-10-CM | POA: Diagnosis not present

## 2020-07-13 DIAGNOSIS — I480 Paroxysmal atrial fibrillation: Secondary | ICD-10-CM | POA: Diagnosis not present

## 2020-07-13 DIAGNOSIS — I5042 Chronic combined systolic (congestive) and diastolic (congestive) heart failure: Secondary | ICD-10-CM | POA: Diagnosis not present

## 2020-07-13 DIAGNOSIS — D6869 Other thrombophilia: Secondary | ICD-10-CM | POA: Diagnosis not present

## 2020-07-13 DIAGNOSIS — E785 Hyperlipidemia, unspecified: Secondary | ICD-10-CM | POA: Diagnosis not present

## 2020-07-13 DIAGNOSIS — Z0000 Encounter for general adult medical examination without abnormal findings: Secondary | ICD-10-CM | POA: Diagnosis not present

## 2020-07-13 DIAGNOSIS — R7302 Impaired glucose tolerance (oral): Secondary | ICD-10-CM | POA: Diagnosis not present

## 2020-07-21 ENCOUNTER — Other Ambulatory Visit: Payer: Self-pay | Admitting: Cardiology

## 2020-08-18 ENCOUNTER — Other Ambulatory Visit: Payer: Self-pay | Admitting: Cardiology

## 2020-09-15 ENCOUNTER — Telehealth: Payer: Self-pay | Admitting: Cardiology

## 2020-09-15 NOTE — Telephone Encounter (Signed)
Patient's wife states the patient has been having mini strokes. She states he has had them for years, but over the last couple weeks he has had them pretty frequently. She would like to know if he can be worked in sooner than his appointment 10/07/2020.

## 2020-09-15 NOTE — Progress Notes (Signed)
Cardiology Office Note:    Date:  09/16/2020   ID:  Joshua Chambers, Joshua Chambers 1923/05/19, MRN [id number]  PCP:  Street, Stephanie Coup, MD  Cardiologist:  Norman Herrlich, MD    Referring MD: [address] Stephanie Coup, *    ASSESSMENT:    1. Paroxysmal atrial fibrillation (HCC)   2. Chronic anticoagulation   3. Coronary artery disease involving coronary bypass graft of native heart without angina pectoris; CAD (coronary artery disease) of artery bypass graft:  loss of VG to RCA and VG-Diag   4. Hypertensive heart disease with chronic combined systolic and diastolic congestive heart failure (HCC)   5. Orthostatic hypotension    PLAN:    In order of problems listed above:  1. Stable he is maintaining sinus rhythm takes a minimum dose of beta-blocker with the CAD should not affect his blood pressure and he is anticoagulated. 2. Check CBCs have previous severe anemia with GI bleed 3. Stable CAD after remote CABG continue medical treatment New [place] Heart Association class I he is presently not having angina 4. Stable hyperlipidemia LDL is at target last assay LDL 96 5. Severe symptomatic stop his alpha-blocker continue alpha agonist and add Florinef.   Next appointment: 6 weeks   Medication Adjustments/Labs and Tests Ordered: Current medicines are reviewed at length with the patient today.  Concerns regarding medicines are outlined above.  No orders of the defined types were placed in this encounter.  No orders of the defined types were placed in this encounter.   Chief Complaint  Patient presents with  . Follow-up    He has what he feels are TIAs, he is having symptomatic hypotension  . Hypotension    History of Present Illness:    Joshua Chambers is a 87 y.o. male with a hx of CAD with remote CABG 2007 hypertensive heart disease symptomatic orthostatic hypotension requiring midodrine therapy APCs TIA and GI bleeding requiring transfusion last seen 04/07/2020. Compliance with diet,  lifestyle and medications: Yes  He takes an alpha-blocker for BPH in the morning and he has episodes where he is quite weak at times his vision is blurred trouble with speech and has the dysesthesia in his hands.  I brought to my office concerned he was having hypotension.  He is taking midodrine.  Standing he has a blood pressure in the range of 60 systolic with symptomatic hypotension.  As opposed to neurologic testing I will stop his alpha-blocker place him on Florinef along with his alpha agonist and his wife will trend blood pressures at home.  If unimproved neurologic evaluation be appropriate.  He has had severe anemia in the past with GI bleeding and anticoagulation is required transfusion also check a CBC on him today.  Fortunately is not having chest pain edema shortness of breath he has not had syncope. Past Medical History:  Diagnosis Date  . Arteriosclerosis of arterial coronary artery bypass graft   . Arthritis   . Atrial contractions, premature   . Atrial fibrillation (HCC)   . Benign hypertension   . Bilateral carotid artery stenosis   . CAD (coronary artery disease), autologous vein bypass graft 04/19/2013   100% flush occlusion of SVG-OM & SVG-Diag; near Subtotal Occlusion of SVG-RCA (PCI of RCA done to avoid distal embolization with PCI to SVG)  . CAD (coronary artery disease), native coronary artery 2007   Multivessel CAD referred for CABG  . CAD S/P percutaneous coronary angioplasty  04/19/2013   PCI of Native RCA -  Promus Premier DES 3.0 mm x 38 mm (3.6 mm);   [name] Chronic anticoagulation 04/17/2018  . Chronic combined systolic and diastolic heart failure (HCC)   . Coronary artery disease involving coronary bypass graft of native heart without angina pectoris; CAD (coronary artery disease) of artery bypass graft:  loss of VG to RCA and VG-Diag 04/17/2013  . Dyslipidemia   . Dyslipidemia, goal LDL below 70   . Dyspnea 04/30/2013  . Essential hypertension 05/02/2013  . Fatigue  04/17/2013  . GERD (gastroesophageal reflux disease)   . Hematuria 05/02/2013  . History of DVT of lower extremity    Right popliteal vein  . History of stroke August 2011   Possible TIA in 2001;  Echo: EF 45-50%, mild HK of anterior septum.  [name] History of transient cerebral ischemia   . Hx of orthostatic hypotension 2009   Positive tilt table:  dizziness/vasopressor type; previously on the decision   . Hx SBO   . Ischemic cardiomyopathy 02/06/2018  . Nephrolithiasis   . Orthostatic hypotension   . Other and unspecified coagulation defects 04/17/2013  . PAF (paroxysmal atrial fibrillation) (HCC) 02/06/2018  . Premature atrial contractions 07/28/2015  . Preoperative examination, unspecified 04/17/2013  . S/P CABG x 3 2007   LIMA-LAD, SVG-DIAG, SVG-RCA  . S/P CABG x 4; LIMA-LAD, SVG-RPDA, SVG-OM, SVG-D1 -- all but LIMA-LAD occluded 03/18/2006   03/2006 - LIMA-LAD, SVG-D1, SVG-RPDA   . Sinus arrhythmia 09/09/2016  . Stroke (HCC)   . TIA (transient ischemic attack) 03/16/2017  . Unsteady gait     Past Surgical History:  Procedure Laterality Date  . APPENDECTOMY    . CARDIAC SURGERY  2000s  . COLON SURGERY    . CORONARY ANGIOPLASTY WITH STENT PLACEMENT  04/19/13   Stent-Promus DES (3.0 mm x 38 mm - 3.6 mm) to native RCA prox-mid,  . CORONARY ARTERY BYPASS GRAFT  2007   LIMA-LAD, SVG-D1, SVG-RCA  . LEFT HEART CATHETERIZATION WITH CORONARY/GRAFT ANGIOGRAM Left 04/19/2013   LEFT HEART CATH W/ CORONARY/GRAFT ANGIOGRAM;  [name], MD;  Location: [facility] CATH LAB;  LM 20%-->LAD 70-80% then subTO after D1 (Patent LIMA-LAD); Small non-dom Cx ~20% ostial.  RCA long 60-70% prox --> 90% mid.  SVG-OM & SVG-Diag 100%, diffuse Subtotal occlusion of SVG-rPD  . STENT PLACEMENT VASCULAR ([facility] HX)    . TONSILLECTOMY      Current Medications: Current Meds  Medication Sig  . acetaminophen (TYLENOL) 500 MG tablet Take 1,000 mg by mouth at bedtime.  [name] atorvastatin (LIPITOR) 40 MG tablet TAKE 1/2 OF A TABLET  BY MOUTH DAILY AT 6:00PM  . dutasteride (AVODART) 0.5 MG capsule Take 0.5 mg by mouth daily.   [name] ELIQUIS 5 MG TABS tablet TAKE 1 TABLET(5 MG) BY MOUTH TWICE DAILY  . furosemide (LASIX) 20 MG tablet TALE ONE TABLET BY MOUTH DAILY ON MONDAY,WEDNESDAY, AND FIRDAYS (Patient taking differently: No sig reported)  . metoprolol tartrate (LOPRESSOR) 25 MG tablet TAKE 1/2 TABLET(12.5 MG) BY MOUTH TWICE DAILY  . midodrine (PROAMATINE) 10 MG tablet TAKE 1 TABLET(10 MG) BY MOUTH FOUR TIMES DAILY  . nitroGLYCERIN (NITROSTAT) 0.4 MG SL tablet Place 1 tablet (0.4 mg total) under the tongue every 5 (five) minutes as needed for chest pain.  [name] omeprazole (PRILOSEC) 20 MG capsule Take 20 mg by mouth every morning.  . Polyvinyl Alcohol-Povidone (REFRESH OP) Place 1 drop into both eyes daily as needed.  [name] PULMICORT FLEXHALER 180 MCG/ACT inhaler Inhale 2 puffs into the lungs daily.  Allergies:   Amiodarone, Diltiazem, Statins, and Sulfa antibiotics   Social History   Socioeconomic History  . Marital status: Married    Spouse name: Not on file  . Number of children: Not on file  . Years of education: Not on file  . Highest education level: Not on file  Occupational History  . Not on file  Tobacco Use  . Smoking status: Never Smoker  . Smokeless tobacco: Never Used  Vaping Use  . Vaping Use: Never used  Substance and Sexual Activity  . Alcohol use: No  . Drug use: No  . Sexual activity: Not on file  Other Topics Concern  . Not on file  Social History Narrative    He is now remarried for the last 2 years, his first wife of 60 years is deceased. He has 1   child from that first marriage and 2 grandchildren.    He does not smoke, does not drink.    He is not very active but he does kind of meander around with his new wife who is permanently disabled.    Social Determinants of Health   Financial Resource Strain: Not on file  Food Insecurity: Not on file  Transportation Needs: Not on file   Physical Activity: Not on file  Stress: Not on file  Social Connections: Not on file     Family History: The patient's family history includes Colon cancer in his father; Hypertension in his son. ROS:   Please see the history of present illness.    All other systems reviewed and are negative.  EKGs/Labs/Other Studies Reviewed:    The following studies were reviewed today:  EKG:  EKG ordered today and personally reviewed.  The ekg ordered today demonstrates sinus rhythm normal EKG  Recent Labs: 04/16/2020: ALT 9; BUN 26; Creatinine, Ser 1.11; Potassium 4.4; Sodium 141  Recent Lipid Panel    Component Value Date/Time   CHOL 168 04/16/2020 0942   TRIG 147 04/16/2020 0942   HDL 46 04/16/2020 0942   CHOLHDL 3.7 04/16/2020 0942   CHOLHDL 3.7 03/17/2017 0006   VLDL 21 03/17/2017 0006   LDLCALC 96 04/16/2020 0942    Physical Exam:    VS:  BP 114/64   Pulse 78   Ht 6\' 2"  (1.88 m)   Wt 201 lb (91.2 kg)   SpO2 99%   BMI 25.81 kg/m     Wt Readings from Last 3 Encounters:  09/16/20 201 lb (91.2 kg)  04/07/20 201 lb 3.2 oz (91.3 kg)  10/08/19 202 lb 12.8 oz (92 kg)     GEN:  Well nourished, well developed in no acute distress HEENT: Normal NECK: No JVD; No carotid bruits LYMPHATICS: No lymphadenopathy CARDIAC: RRR, no murmurs, rubs, gallops RESPIRATORY:  Clear to auscultation without rales, wheezing or rhonchi  ABDOMEN: Soft, non-tender, non-distended MUSCULOSKELETAL:  No edema; No deformity  SKIN: Warm and dry NEUROLOGIC:  Alert and oriented x 3 PSYCHIATRIC:  Normal affect    Signed, 10/10[birth year]19, MD  09/16/2020 3:19 PM    [facility]

## 2020-09-15 NOTE — Telephone Encounter (Signed)
Called and spoke to patients wife per dpr. She reports the patient has had mini strokes for years and is having them more frequently lately. The patient will have intermittent numbness to hands, arms, shoulders, and mouth at times and will also have difficulty walking. These symptoms come and go. He reports headache and blurred vision at times as well. He is not having any of the symptoms at this time. Patients wife would like patient to see Dr. Dulce Sellar sooner than 10/07/20. He does not have have anything available with check with DOD for further instruction and leave this note for Dr. Dulce Sellar to answer tomorrow as well for him to review. Also patient wife was advised patient should be seen in the emergency department should these symptoms return to prevent irreversible damage. She understood no further questions.

## 2020-09-15 NOTE — Telephone Encounter (Signed)
Called spoke to patients wife per dpr scheduled patient to see Dr. Dulce Sellar tomorrow in [place].

## 2020-09-15 NOTE — Telephone Encounter (Signed)
Can you work in at [facility]?

## 2020-09-16 ENCOUNTER — Encounter: Payer: Self-pay | Admitting: Cardiology

## 2020-09-16 ENCOUNTER — Other Ambulatory Visit: Payer: Self-pay

## 2020-09-16 ENCOUNTER — Ambulatory Visit (INDEPENDENT_AMBULATORY_CARE_PROVIDER_SITE_OTHER): Payer: Medicare Other | Admitting: Cardiology

## 2020-09-16 VITALS — BP 114/64 | HR 78 | Ht 74.0 in | Wt 201.0 lb

## 2020-09-16 DIAGNOSIS — Z7901 Long term (current) use of anticoagulants: Secondary | ICD-10-CM

## 2020-09-16 DIAGNOSIS — I951 Orthostatic hypotension: Secondary | ICD-10-CM

## 2020-09-16 DIAGNOSIS — I110 Hypertensive heart disease with heart failure: Secondary | ICD-10-CM

## 2020-09-16 DIAGNOSIS — I480 Paroxysmal atrial fibrillation: Secondary | ICD-10-CM | POA: Diagnosis not present

## 2020-09-16 DIAGNOSIS — I5042 Chronic combined systolic (congestive) and diastolic (congestive) heart failure: Secondary | ICD-10-CM | POA: Diagnosis not present

## 2020-09-16 DIAGNOSIS — I25810 Atherosclerosis of coronary artery bypass graft(s) without angina pectoris: Secondary | ICD-10-CM | POA: Diagnosis not present

## 2020-09-16 MED ORDER — FLUDROCORTISONE ACETATE 0.1 MG PO TABS: 0.1000 mg | ORAL_TABLET | Freq: Every day | ORAL | 3 refills | Status: DC

## 2020-09-16 NOTE — Patient Instructions (Signed)
Medication Instructions:  Your physician has recommended you make the following change in your medication:  START: Florinef 0.1 mg daily *If you need a refill on your cardiac medications before your next appointment, please call your pharmacy*   Lab Work: Your physician recommends that you return for lab work:  TODAY: CBC, BMET If you have labs (blood work) drawn today and your tests are completely normal, you will receive your results only by: [name] MyChart Message (if you have MyChart) OR . A paper copy in the mail If you have any lab test that is abnormal or we need to change your treatment, we will call you to review the results.   Testing/Procedures: None   Follow-Up: At [facility], you and your health needs are our priority.  As part of our continuing mission to provide you with exceptional heart care, we have created designated Provider Care Teams.  These Care Teams include your primary Cardiologist (physician) and Advanced Practice Providers (APPs -  Physician Assistants and Nurse Practitioners) who all work together to provide you with the care you need, when you need it.  We recommend signing up for the patient portal called "MyChart".  Sign up information is provided on this After Visit Summary.  MyChart is used to connect with patients for Virtual Visits (Telemedicine).  Patients are able to view lab/test results, encounter notes, upcoming appointments, etc.  Non-urgent messages can be sent to your provider as well.   To learn more about what you can do with MyChart, go to [email or web address].    Your next appointment:   6 week(s)  The format for your next appointment:   In Person  Provider:   Norman Herrlich, MD   Other Instructions

## 2020-09-16 NOTE — Addendum Note (Signed)
Addended by: Reynolds Bowl on: 09/16/2020 03:35 PM   Modules accepted: Orders

## 2020-09-17 LAB — BASIC METABOLIC PANEL
BUN/Creatinine Ratio: 19 (ref 10–24)
BUN: 22 mg/dL (ref 10–36)
CO2: 20 mmol/L (ref 20–29)
Calcium: 9.3 mg/dL (ref 8.6–10.2)
Chloride: 104 mmol/L (ref 96–106)
Creatinine, Ser: 1.18 mg/dL (ref 0.76–1.27)
Glucose: 106 mg/dL — ABNORMAL HIGH (ref 65–99)
Potassium: 4.7 mmol/L (ref 3.5–5.2)
Sodium: 141 mmol/L (ref 134–144)
eGFR: 57 mL/min/{1.73_m2} — ABNORMAL LOW (ref >59)

## 2020-09-17 LAB — CBC WITH DIFFERENTIAL/PLATELET
Basophils Absolute: 0 10*3/uL (ref 0.0–0.2)
Basos: 0 %
EOS (ABSOLUTE): 0.1 10*3/uL (ref 0.0–0.4)
Eos: 2 %
Hematocrit: 32.2 % — ABNORMAL LOW (ref 37.5–51.0)
Hemoglobin: 10.3 g/dL — ABNORMAL LOW (ref 13.0–17.7)
Immature Grans (Abs): 0 10*3/uL (ref 0.0–0.1)
Immature Granulocytes: 0 %
Lymphocytes Absolute: 1.3 10*3/uL (ref 0.7–3.1)
Lymphs: 20 %
MCH: 28.7 pg (ref 26.6–33.0)
MCHC: 32 g/dL (ref 31.5–35.7)
MCV: 90 fL (ref 79–97)
Monocytes Absolute: 0.7 10*3/uL (ref 0.1–0.9)
Monocytes: 10 %
Neutrophils Absolute: 4.3 10*3/uL (ref 1.4–7.0)
Neutrophils: 68 %
Platelets: 343 10*3/uL (ref 150–450)
RBC: 3.59 x10E6/uL — ABNORMAL LOW (ref 4.14–5.80)
RDW: 12.2 % (ref 11.6–15.4)
WBC: 6.4 10*3/uL (ref 3.4–10.8)

## 2020-10-06 NOTE — Progress Notes (Signed)
Cardiology Office Note:    Date:  10/07/2020   ID:  [name], [name] [birth year], MRN [id number]  PCP:  Street, Stephanie Coup, MD  Cardiologist:  Norman Herrlich, MD    Referring MD: [address] Stephanie Coup, *    ASSESSMENT:    1. Orthostatic hypotension   2. Hypertensive heart disease with chronic combined systolic and diastolic congestive heart failure (HCC)   3. Chronic anticoagulation   4. TIA (transient ischemic attack)    PLAN:    In order of problems listed above:  1. Improved but remains symptomatic on midodrine and Florinef.  Furosemide and utilize an abdominal binder to blunt his orthostatic symptoms in the morning 2. Stable I think we will have to stop his diuretic 3. Continue his current anticoagulant he has had no recurrent bleeding 4. Recheck carotid duplex if he had severe flow-limiting stenosis we could consider stenting   Next appointment: 3 months   Medication Adjustments/Labs and Tests Ordered: Current medicines are reviewed at length with the patient today.  Concerns regarding medicines are outlined above.  No orders of the defined types were placed in this encounter.  No orders of the defined types were placed in this encounter.   Chief Complaint  Patient presents with  . Follow-up  . Hypotension    History of Present Illness:    Joshua Chambers is a 87 y.o. male with a hx of CAD with remote CABG in 2007 hypertensive heart disease symptomatic orthostatic hypotension requiring midodrine therapy paroxysmal atrial fibrillation TIA and GI bleeding requiring transfusion.  He was last seen 09/16/2020 with symptomatic hypotension his alpha-blocker was discontinued he was continued on midodrine and with hypotension was placed on Florinef.  He had a standing blood pressure of 60 systolic during the office visit.. Compliance with diet, lifestyle and medications: Yes  Initially he did better but recently in the morning he gets systolics less than 100 and struggle  with lightheadedness.  He also has some persistent left-sided sensory changes.  Later in the day he is fine.  He is on Florinef but takes a loop diuretic we will stop his diuretic he has no evidence of heart failure continue midodrine I think at this time we have to stop his diuretic and I asked him to use an abdominal binder to blunt his symptoms also check a carotid duplex. Past Medical History:  Diagnosis Date  . Arteriosclerosis of arterial coronary artery bypass graft   . Arthritis   . Atrial contractions, premature   . Atrial fibrillation (HCC)   . Benign hypertension   . Bilateral carotid artery stenosis   . CAD (coronary artery disease), autologous vein bypass graft 04/19/2013   100% flush occlusion of SVG-OM & SVG-Diag; near Subtotal Occlusion of SVG-RCA (PCI of RCA done to avoid distal embolization with PCI to SVG)  . CAD (coronary artery disease), native coronary artery 2007   Multivessel CAD referred for CABG  . CAD S/P percutaneous coronary angioplasty  04/19/2013   PCI of Native RCA - Promus Premier DES 3.0 mm x 38 mm (3.6 mm);   [name] Chronic anticoagulation 04/17/2018  . Chronic combined systolic and diastolic heart failure (HCC)   . Coronary artery disease involving coronary bypass graft of native heart without angina pectoris; CAD (coronary artery disease) of artery bypass graft:  loss of VG to RCA and VG-Diag 04/17/2013  . Dyslipidemia   . Dyslipidemia, goal LDL below 70   . Dyspnea 04/30/2013  . Essential hypertension 05/02/2013  .  Fatigue 04/17/2013  . GERD (gastroesophageal reflux disease)   . Hematuria 05/02/2013  . History of DVT of lower extremity    Right popliteal vein  . History of stroke August 2011   Possible TIA in 2001;  Echo: EF 45-50%, mild HK of anterior septum.  [name] History of transient cerebral ischemia   . Hx of orthostatic hypotension 2009   Positive tilt table:  dizziness/vasopressor type; previously on the decision   . Hx SBO   . Ischemic  cardiomyopathy 02/06/2018  . Nephrolithiasis   . Orthostatic hypotension   . Other and unspecified coagulation defects 04/17/2013  . PAF (paroxysmal atrial fibrillation) (HCC) 02/06/2018  . Premature atrial contractions 07/28/2015  . Preoperative examination, unspecified 04/17/2013  . S/P CABG x 3 2007   LIMA-LAD, SVG-DIAG, SVG-RCA  . S/P CABG x 4; LIMA-LAD, SVG-RPDA, SVG-OM, SVG-D1 -- all but LIMA-LAD occluded 03/18/2006   03/2006 - LIMA-LAD, SVG-D1, SVG-RPDA   . Sinus arrhythmia 09/09/2016  . Stroke (HCC)   . TIA (transient ischemic attack) 03/16/2017  . Unsteady gait     Past Surgical History:  Procedure Laterality Date  . APPENDECTOMY    . CARDIAC SURGERY  2000s  . COLON SURGERY    . CORONARY ANGIOPLASTY WITH STENT PLACEMENT  04/19/13   Stent-Promus DES (3.0 mm x 38 mm - 3.6 mm) to native RCA prox-mid,  . CORONARY ARTERY BYPASS GRAFT  2007   LIMA-LAD, SVG-D1, SVG-RCA  . LEFT HEART CATHETERIZATION WITH CORONARY/GRAFT ANGIOGRAM Left 04/19/2013   LEFT HEART CATH W/ CORONARY/GRAFT ANGIOGRAM;  [name], MD;  Location: [facility] CATH LAB;  LM 20%-->LAD 70-80% then subTO after D1 (Patent LIMA-LAD); Small non-dom Cx ~20% ostial.  RCA long 60-70% prox --> 90% mid.  SVG-OM & SVG-Diag 100%, diffuse Subtotal occlusion of SVG-rPD  . STENT PLACEMENT VASCULAR ([facility] HX)    . TONSILLECTOMY      Current Medications: Current Meds  Medication Sig  . acetaminophen (TYLENOL) 500 MG tablet Take 1,000 mg by mouth at bedtime.  [name] atorvastatin (LIPITOR) 40 MG tablet TAKE 1/2 OF A TABLET BY MOUTH DAILY AT 6:00PM  . ELIQUIS 5 MG TABS tablet TAKE 1 TABLET(5 MG) BY MOUTH TWICE DAILY  . fludrocortisone (FLORINEF) 0.1 MG tablet Take 1 tablet (0.1 mg total) by mouth daily.  . furosemide (LASIX) 20 MG tablet TALE ONE TABLET BY MOUTH DAILY ON MONDAY,WEDNESDAY, AND FIRDAYS (Patient taking differently: No sig reported)  . metoprolol tartrate (LOPRESSOR) 25 MG tablet TAKE 1/2 TABLET(12.5 MG) BY MOUTH TWICE DAILY  .  midodrine (PROAMATINE) 10 MG tablet TAKE 1 TABLET(10 MG) BY MOUTH FOUR TIMES DAILY  . nitroGLYCERIN (NITROSTAT) 0.4 MG SL tablet Place 1 tablet (0.4 mg total) under the tongue every 5 (five) minutes as needed for chest pain.  [name] omeprazole (PRILOSEC) 20 MG capsule Take 20 mg by mouth every morning.  . Polyvinyl Alcohol-Povidone (REFRESH OP) Place 1 drop into both eyes daily as needed.  [name] PULMICORT FLEXHALER 180 MCG/ACT inhaler Inhale 2 puffs into the lungs daily.      Allergies:   Amiodarone, Diltiazem, Statins, and Sulfa antibiotics   Social History   Socioeconomic History  . Marital status: Married    Spouse name: Not on file  . Number of children: Not on file  . Years of education: Not on file  . Highest education level: Not on file  Occupational History  . Not on file  Tobacco Use  . Smoking status: Never Smoker  . Smokeless tobacco:  Never Used  Vaping Use  . Vaping Use: Never used  Substance and Sexual Activity  . Alcohol use: No  . Drug use: No  . Sexual activity: Not on file  Other Topics Concern  . Not on file  Social History Narrative    He is now remarried for the last 2 years, his first wife of 60 years is deceased. He has 1   child from that first marriage and 2 grandchildren.    He does not smoke, does not drink.    He is not very active but he does kind of meander around with his new wife who is permanently disabled.    Social Determinants of Health   Financial Resource Strain: Not on file  Food Insecurity: Not on file  Transportation Needs: Not on file  Physical Activity: Not on file  Stress: Not on file  Social Connections: Not on file     Family History: The patient's family history includes Colon cancer in his father; Hypertension in his son. ROS:   Please see the history of present illness.    All other systems reviewed and are negative.  EKGs/Labs/Other Studies Reviewed:    The following studies were reviewed today:    Recent  Labs: 04/16/2020: ALT 9 09/16/2020: BUN 22; Creatinine, Ser 1.18; Hemoglobin 10.3; Platelets 343; Potassium 4.7; Sodium 141  Recent Lipid Panel    Component Value Date/Time   CHOL 168 04/16/2020 0942   TRIG 147 04/16/2020 0942   HDL 46 04/16/2020 0942   CHOLHDL 3.7 04/16/2020 0942   CHOLHDL 3.7 03/17/2017 0006   VLDL 21 03/17/2017 0006   LDLCALC 96 04/16/2020 0942    Physical Exam:    VS:  BP 122/78 Comment: Recheck  Pulse 64   Ht 6\' 2"  (1.88 m)   Wt 204 lb 3.2 oz (92.6 kg)   SpO2 98%   BMI 26.22 kg/m     Wt Readings from Last 3 Encounters:  10/07/20 204 lb 3.2 oz (92.6 kg)  09/16/20 201 lb (91.2 kg)  04/07/20 201 lb 3.2 oz (91.3 kg)     GEN:  Well nourished, well developed in no acute distress HEENT: Normal NECK: No JVD; No carotid bruits LYMPHATICS: No lymphadenopathy CARDIAC: RRR, no murmurs, rubs, gallops RESPIRATORY:  Clear to auscultation without rales, wheezing or rhonchi  ABDOMEN: Soft, non-tender, non-distended MUSCULOSKELETAL:  No edema; No deformity  SKIN: Warm and dry NEUROLOGIC:  Alert and oriented x 3 PSYCHIATRIC:  Normal affect    Signed, Norman Herrlich, MD  10/07/2020 9:49 AM    [facility]

## 2020-10-07 ENCOUNTER — Encounter: Payer: Self-pay | Admitting: Cardiology

## 2020-10-07 ENCOUNTER — Other Ambulatory Visit: Payer: Self-pay

## 2020-10-07 ENCOUNTER — Ambulatory Visit (INDEPENDENT_AMBULATORY_CARE_PROVIDER_SITE_OTHER): Payer: Medicare Other | Admitting: Cardiology

## 2020-10-07 VITALS — BP 122/78 | HR 64 | Ht 74.0 in | Wt 204.2 lb

## 2020-10-07 DIAGNOSIS — I110 Hypertensive heart disease with heart failure: Secondary | ICD-10-CM

## 2020-10-07 DIAGNOSIS — G459 Transient cerebral ischemic attack, unspecified: Secondary | ICD-10-CM | POA: Diagnosis not present

## 2020-10-07 DIAGNOSIS — I5042 Chronic combined systolic (congestive) and diastolic (congestive) heart failure: Secondary | ICD-10-CM

## 2020-10-07 DIAGNOSIS — I951 Orthostatic hypotension: Secondary | ICD-10-CM

## 2020-10-07 DIAGNOSIS — Z7901 Long term (current) use of anticoagulants: Secondary | ICD-10-CM

## 2020-10-07 NOTE — Patient Instructions (Signed)
Medication Instructions:  Your physician has recommended you make the following change in your medication:  STOP: Furosemide  *If you need a refill on your cardiac medications before your next appointment, please call your pharmacy*   Lab Work: None If you have labs (blood work) drawn today and your tests are completely normal, you will receive your results only by: [name] MyChart Message (if you have MyChart) OR . A paper copy in the mail If you have any lab test that is abnormal or we need to change your treatment, we will call you to review the results.   Testing/Procedures: Your physician has requested that you have a carotid duplex. This test is an ultrasound of the carotid arteries in your neck. It looks at blood flow through these arteries that supply the brain with blood. Allow one hour for this exam. There are no restrictions or special instructions.    Follow-Up: At [facility], you and your health needs are our priority.  As part of our continuing mission to provide you with exceptional heart care, we have created designated Provider Care Teams.  These Care Teams include your primary Cardiologist (physician) and Advanced Practice Providers (APPs -  Physician Assistants and Nurse Practitioners) who all work together to provide you with the care you need, when you need it.  We recommend signing up for the patient portal called "MyChart".  Sign up information is provided on this After Visit Summary.  MyChart is used to connect with patients for Virtual Visits (Telemedicine).  Patients are able to view lab/test results, encounter notes, upcoming appointments, etc.  Non-urgent messages can be sent to your provider as well.   To learn more about what you can do with MyChart, go to [email or web address].    Your next appointment:   3 month(s)  The format for your next appointment:   In Person  Provider:   Norman Herrlich, MD   Other Instructions

## 2020-10-07 NOTE — Addendum Note (Signed)
Addended by: Delorse Limber I on: 10/07/2020 09:52 AM   Modules accepted: Orders

## 2020-10-20 ENCOUNTER — Ambulatory Visit (INDEPENDENT_AMBULATORY_CARE_PROVIDER_SITE_OTHER): Payer: Medicare Other

## 2020-10-20 ENCOUNTER — Other Ambulatory Visit: Payer: Self-pay

## 2020-10-20 DIAGNOSIS — G459 Transient cerebral ischemic attack, unspecified: Secondary | ICD-10-CM | POA: Diagnosis not present

## 2020-10-20 NOTE — Progress Notes (Signed)
Carotid duplex exam has been performed.  Jimmy Sadarius Norman RDCS, RVT 

## 2020-10-21 ENCOUNTER — Telehealth: Payer: Self-pay

## 2020-10-21 NOTE — Telephone Encounter (Signed)
Spoke with patient regarding results and recommendation.  Patient verbalizes understanding and is agreeable to plan of care. Advised patient to call back with any issues or concerns.  

## 2020-10-21 NOTE — Telephone Encounter (Signed)
-----   Message from Baldo Daub, MD sent at 10/21/2020 12:11 PM EDT ----- Good result no severe blockage in the carotid arteries

## 2020-10-22 DIAGNOSIS — S72002A Fracture of unspecified part of neck of left femur, initial encounter for closed fracture: Secondary | ICD-10-CM | POA: Diagnosis not present

## 2020-10-24 DIAGNOSIS — Z951 Presence of aortocoronary bypass graft: Secondary | ICD-10-CM | POA: Diagnosis not present

## 2020-10-24 DIAGNOSIS — S7292XD Unspecified fracture of left femur, subsequent encounter for closed fracture with routine healing: Secondary | ICD-10-CM | POA: Diagnosis not present

## 2020-10-24 DIAGNOSIS — I482 Chronic atrial fibrillation, unspecified: Secondary | ICD-10-CM | POA: Diagnosis present

## 2020-10-24 DIAGNOSIS — Z7901 Long term (current) use of anticoagulants: Secondary | ICD-10-CM | POA: Diagnosis not present

## 2020-10-24 DIAGNOSIS — H9190 Unspecified hearing loss, unspecified ear: Secondary | ICD-10-CM | POA: Diagnosis present

## 2020-10-24 DIAGNOSIS — R0602 Shortness of breath: Secondary | ICD-10-CM | POA: Diagnosis not present

## 2020-10-24 DIAGNOSIS — Z955 Presence of coronary angioplasty implant and graft: Secondary | ICD-10-CM | POA: Diagnosis not present

## 2020-10-24 DIAGNOSIS — Z9181 History of falling: Secondary | ICD-10-CM | POA: Diagnosis not present

## 2020-10-24 DIAGNOSIS — Z862 Personal history of diseases of the blood and blood-forming organs and certain disorders involving the immune mechanism: Secondary | ICD-10-CM | POA: Diagnosis not present

## 2020-10-24 DIAGNOSIS — R2689 Other abnormalities of gait and mobility: Secondary | ICD-10-CM | POA: Diagnosis not present

## 2020-10-24 DIAGNOSIS — I2510 Atherosclerotic heart disease of native coronary artery without angina pectoris: Secondary | ICD-10-CM | POA: Diagnosis present

## 2020-10-24 DIAGNOSIS — Z8673 Personal history of transient ischemic attack (TIA), and cerebral infarction without residual deficits: Secondary | ICD-10-CM | POA: Diagnosis not present

## 2020-10-24 DIAGNOSIS — W19XXXD Unspecified fall, subsequent encounter: Secondary | ICD-10-CM | POA: Diagnosis not present

## 2020-10-24 DIAGNOSIS — G9341 Metabolic encephalopathy: Secondary | ICD-10-CM | POA: Diagnosis present

## 2020-10-24 DIAGNOSIS — S72042A Displaced fracture of base of neck of left femur, initial encounter for closed fracture: Secondary | ICD-10-CM | POA: Diagnosis not present

## 2020-10-24 DIAGNOSIS — I10 Essential (primary) hypertension: Secondary | ICD-10-CM | POA: Diagnosis present

## 2020-10-24 DIAGNOSIS — D649 Anemia, unspecified: Secondary | ICD-10-CM | POA: Diagnosis not present

## 2020-10-24 DIAGNOSIS — Z743 Need for continuous supervision: Secondary | ICD-10-CM | POA: Diagnosis not present

## 2020-10-24 DIAGNOSIS — S72012A Unspecified intracapsular fracture of left femur, initial encounter for closed fracture: Secondary | ICD-10-CM | POA: Diagnosis not present

## 2020-10-24 DIAGNOSIS — Z96642 Presence of left artificial hip joint: Secondary | ICD-10-CM | POA: Diagnosis not present

## 2020-10-24 DIAGNOSIS — M25552 Pain in left hip: Secondary | ICD-10-CM | POA: Diagnosis not present

## 2020-10-24 DIAGNOSIS — S82852D Displaced trimalleolar fracture of left lower leg, subsequent encounter for closed fracture with routine healing: Secondary | ICD-10-CM | POA: Diagnosis not present

## 2020-10-24 DIAGNOSIS — I4891 Unspecified atrial fibrillation: Secondary | ICD-10-CM | POA: Diagnosis not present

## 2020-10-24 DIAGNOSIS — R0902 Hypoxemia: Secondary | ICD-10-CM | POA: Diagnosis not present

## 2020-10-24 DIAGNOSIS — E785 Hyperlipidemia, unspecified: Secondary | ICD-10-CM | POA: Diagnosis present

## 2020-10-24 DIAGNOSIS — R278 Other lack of coordination: Secondary | ICD-10-CM | POA: Diagnosis not present

## 2020-10-24 DIAGNOSIS — M1990 Unspecified osteoarthritis, unspecified site: Secondary | ICD-10-CM | POA: Diagnosis present

## 2020-10-24 DIAGNOSIS — M6281 Muscle weakness (generalized): Secondary | ICD-10-CM | POA: Diagnosis not present

## 2020-10-24 DIAGNOSIS — I9589 Other hypotension: Secondary | ICD-10-CM | POA: Diagnosis not present

## 2020-10-24 DIAGNOSIS — Z043 Encounter for examination and observation following other accident: Secondary | ICD-10-CM | POA: Diagnosis not present

## 2020-10-24 DIAGNOSIS — S72002A Fracture of unspecified part of neck of left femur, initial encounter for closed fracture: Secondary | ICD-10-CM | POA: Diagnosis not present

## 2020-10-24 DIAGNOSIS — R531 Weakness: Secondary | ICD-10-CM | POA: Diagnosis not present

## 2020-10-24 DIAGNOSIS — W19XXXA Unspecified fall, initial encounter: Secondary | ICD-10-CM | POA: Diagnosis not present

## 2020-10-24 DIAGNOSIS — Z79899 Other long term (current) drug therapy: Secondary | ICD-10-CM | POA: Diagnosis not present

## 2020-10-24 DIAGNOSIS — R279 Unspecified lack of coordination: Secondary | ICD-10-CM | POA: Diagnosis not present

## 2020-10-24 DIAGNOSIS — M84451A Pathological fracture, right femur, initial encounter for fracture: Secondary | ICD-10-CM | POA: Diagnosis not present

## 2020-10-27 ENCOUNTER — Ambulatory Visit: Payer: Medicare Other | Admitting: Cardiology

## 2020-10-28 DIAGNOSIS — I2510 Atherosclerotic heart disease of native coronary artery without angina pectoris: Secondary | ICD-10-CM | POA: Diagnosis not present

## 2020-10-28 DIAGNOSIS — J439 Emphysema, unspecified: Secondary | ICD-10-CM | POA: Diagnosis not present

## 2020-10-28 DIAGNOSIS — I517 Cardiomegaly: Secondary | ICD-10-CM | POA: Diagnosis not present

## 2020-10-28 DIAGNOSIS — R202 Paresthesia of skin: Secondary | ICD-10-CM | POA: Diagnosis not present

## 2020-10-28 DIAGNOSIS — I9589 Other hypotension: Secondary | ICD-10-CM | POA: Diagnosis not present

## 2020-10-28 DIAGNOSIS — R279 Unspecified lack of coordination: Secondary | ICD-10-CM | POA: Diagnosis not present

## 2020-10-28 DIAGNOSIS — G8918 Other acute postprocedural pain: Secondary | ICD-10-CM | POA: Diagnosis not present

## 2020-10-28 DIAGNOSIS — R21 Rash and other nonspecific skin eruption: Secondary | ICD-10-CM | POA: Diagnosis not present

## 2020-10-28 DIAGNOSIS — I4891 Unspecified atrial fibrillation: Secondary | ICD-10-CM | POA: Diagnosis not present

## 2020-10-28 DIAGNOSIS — R404 Transient alteration of awareness: Secondary | ICD-10-CM | POA: Diagnosis not present

## 2020-10-28 DIAGNOSIS — R262 Difficulty in walking, not elsewhere classified: Secondary | ICD-10-CM | POA: Diagnosis not present

## 2020-10-28 DIAGNOSIS — R9082 White matter disease, unspecified: Secondary | ICD-10-CM | POA: Diagnosis not present

## 2020-10-28 DIAGNOSIS — Z951 Presence of aortocoronary bypass graft: Secondary | ICD-10-CM | POA: Diagnosis not present

## 2020-10-28 DIAGNOSIS — Z9181 History of falling: Secondary | ICD-10-CM | POA: Diagnosis not present

## 2020-10-28 DIAGNOSIS — M25552 Pain in left hip: Secondary | ICD-10-CM | POA: Diagnosis not present

## 2020-10-28 DIAGNOSIS — M25559 Pain in unspecified hip: Secondary | ICD-10-CM | POA: Diagnosis not present

## 2020-10-28 DIAGNOSIS — M6281 Muscle weakness (generalized): Secondary | ICD-10-CM | POA: Diagnosis not present

## 2020-10-28 DIAGNOSIS — G9341 Metabolic encephalopathy: Secondary | ICD-10-CM | POA: Diagnosis not present

## 2020-10-28 DIAGNOSIS — I10 Essential (primary) hypertension: Secondary | ICD-10-CM | POA: Diagnosis not present

## 2020-10-28 DIAGNOSIS — W19XXXD Unspecified fall, subsequent encounter: Secondary | ICD-10-CM | POA: Diagnosis not present

## 2020-10-28 DIAGNOSIS — S72002D Fracture of unspecified part of neck of left femur, subsequent encounter for closed fracture with routine healing: Secondary | ICD-10-CM | POA: Diagnosis not present

## 2020-10-28 DIAGNOSIS — G319 Degenerative disease of nervous system, unspecified: Secondary | ICD-10-CM | POA: Diagnosis not present

## 2020-10-28 DIAGNOSIS — R0902 Hypoxemia: Secondary | ICD-10-CM | POA: Diagnosis not present

## 2020-10-28 DIAGNOSIS — D649 Anemia, unspecified: Secondary | ICD-10-CM | POA: Diagnosis not present

## 2020-10-28 DIAGNOSIS — Z743 Need for continuous supervision: Secondary | ICD-10-CM | POA: Diagnosis not present

## 2020-10-28 DIAGNOSIS — E785 Hyperlipidemia, unspecified: Secondary | ICD-10-CM | POA: Diagnosis not present

## 2020-10-28 DIAGNOSIS — S82852D Displaced trimalleolar fracture of left lower leg, subsequent encounter for closed fracture with routine healing: Secondary | ICD-10-CM | POA: Diagnosis not present

## 2020-10-28 DIAGNOSIS — R278 Other lack of coordination: Secondary | ICD-10-CM | POA: Diagnosis not present

## 2020-10-28 DIAGNOSIS — R531 Weakness: Secondary | ICD-10-CM | POA: Diagnosis not present

## 2020-10-28 DIAGNOSIS — R2689 Other abnormalities of gait and mobility: Secondary | ICD-10-CM | POA: Diagnosis not present

## 2020-10-28 DIAGNOSIS — S7292XD Unspecified fracture of left femur, subsequent encounter for closed fracture with routine healing: Secondary | ICD-10-CM | POA: Diagnosis not present

## 2020-11-02 DIAGNOSIS — R262 Difficulty in walking, not elsewhere classified: Secondary | ICD-10-CM | POA: Diagnosis not present

## 2020-11-02 DIAGNOSIS — S72002D Fracture of unspecified part of neck of left femur, subsequent encounter for closed fracture with routine healing: Secondary | ICD-10-CM | POA: Diagnosis not present

## 2020-11-02 DIAGNOSIS — G8918 Other acute postprocedural pain: Secondary | ICD-10-CM | POA: Diagnosis not present

## 2020-11-02 DIAGNOSIS — D649 Anemia, unspecified: Secondary | ICD-10-CM | POA: Diagnosis not present

## 2020-11-04 ENCOUNTER — Other Ambulatory Visit: Payer: Self-pay | Admitting: *Deleted

## 2020-11-04 NOTE — Patient Outreach (Signed)
Member screened for potential [facility] Care Management services.  Per [facility] (Patient Ilda Foil) member resides in [place] [facility].   Communication sent to SNF SW to inquire about transition plans and potential [facility] Care Management needs.   Will continue to follow while member resides in SNF.    Raiford Noble, MSN, RN,BSN [facility] Post Acute Care Coordinator [phone] [facility]) [phone]  (Toll free office)

## 2020-11-06 ENCOUNTER — Other Ambulatory Visit: Payer: Self-pay | Admitting: *Deleted

## 2020-11-09 DIAGNOSIS — J439 Emphysema, unspecified: Secondary | ICD-10-CM | POA: Diagnosis not present

## 2020-11-09 DIAGNOSIS — G319 Degenerative disease of nervous system, unspecified: Secondary | ICD-10-CM | POA: Diagnosis not present

## 2020-11-09 DIAGNOSIS — R202 Paresthesia of skin: Secondary | ICD-10-CM | POA: Diagnosis not present

## 2020-11-09 DIAGNOSIS — I10 Essential (primary) hypertension: Secondary | ICD-10-CM | POA: Diagnosis not present

## 2020-11-09 DIAGNOSIS — I517 Cardiomegaly: Secondary | ICD-10-CM | POA: Diagnosis not present

## 2020-11-09 DIAGNOSIS — R9082 White matter disease, unspecified: Secondary | ICD-10-CM | POA: Diagnosis not present

## 2020-11-09 DIAGNOSIS — R21 Rash and other nonspecific skin eruption: Secondary | ICD-10-CM | POA: Diagnosis not present

## 2020-11-09 NOTE — Patient Outreach (Signed)
[facility] Post- Acute Care Coordinator follow up. Member screened for potential [facility] Care Management needs.  Update received from [facility] SW indicating transition plan is to return home with spouse. Transition date not set as of yet.   Will continue to follow while member resides in SNF.    Raiford Noble, MSN, RN,BSN [facility] Post Acute Care Coordinator [phone] [facility]) [phone]  (Toll free office)

## 2020-11-16 ENCOUNTER — Other Ambulatory Visit: Payer: Self-pay | Admitting: *Deleted

## 2020-11-16 NOTE — Patient Outreach (Signed)
[facility] Post-Acute Care Coordinator follow up. [name] resides in [place] [facility].   Update received from [facility] SW indicating she has been discussing ALF with [name] wife. States the wife is unable to provide any physical assistance at home.  Will continue to follow transition plans and for potential [facility] Care Management needs.   Joshua Noble, MSN, RN,BSN [facility] Post Acute Care Coordinator [phone] [facility]) [phone]  (Toll free office)

## 2020-11-17 ENCOUNTER — Telehealth: Payer: Self-pay | Admitting: Cardiology

## 2020-11-17 NOTE — Telephone Encounter (Signed)
Patient's wife called to say she wasn't sure if her husband is having a mini stroke or a seizure but either way she wants to know if he can put him on a medication to keep that from happening. Please advise

## 2020-11-17 NOTE — Telephone Encounter (Signed)
Called and spoke to wife per dpr. She reports for years the patient has had "mini strokes". They do not last only like 20 minute episodes where his hands and arms go numb and face. 1 week ago he had one and ems was called he was seen at [place] after. He consulted with a neurologist who told him th at he may be having seizures instead. No plans of following up with a neurologist per the wife. He had another episode yesterday lasting 2 hours. She was wanting to know if Dr. Dulce Sellar will advise any medication to help with this. We already have him on eliquis 5 mg twice daily. I informed her from a cardiac stand point I am not sure what else we could do. Most likely patient should see pcp to get referred to neurology. Will confirm with Dr. Servando Salina in Dr. Hulen Shouts absence to see if she recommends anything further.

## 2020-11-17 NOTE — Telephone Encounter (Signed)
If they were told by neurology in the past that he may be having seizures.  I would recommend she call to make an appointment to see the neurologist.  Unfortunately, I cannot recommend medications for seizure.

## 2020-11-17 NOTE — Telephone Encounter (Signed)
Called and spoke to patient wife. Informed her that the patient needs to see pcp to be referred to neurologist so they can treat for seizures if that is the issue she understood no further questions.

## 2020-11-29 DIAGNOSIS — I2510 Atherosclerotic heart disease of native coronary artery without angina pectoris: Secondary | ICD-10-CM | POA: Diagnosis not present

## 2020-11-29 DIAGNOSIS — K5909 Other constipation: Secondary | ICD-10-CM | POA: Diagnosis not present

## 2020-11-29 DIAGNOSIS — H9190 Unspecified hearing loss, unspecified ear: Secondary | ICD-10-CM | POA: Diagnosis not present

## 2020-11-29 DIAGNOSIS — I4891 Unspecified atrial fibrillation: Secondary | ICD-10-CM | POA: Diagnosis not present

## 2020-11-29 DIAGNOSIS — M810 Age-related osteoporosis without current pathological fracture: Secondary | ICD-10-CM | POA: Diagnosis not present

## 2020-11-29 DIAGNOSIS — I10 Essential (primary) hypertension: Secondary | ICD-10-CM | POA: Diagnosis not present

## 2020-11-29 DIAGNOSIS — Z8673 Personal history of transient ischemic attack (TIA), and cerebral infarction without residual deficits: Secondary | ICD-10-CM | POA: Diagnosis not present

## 2020-11-29 DIAGNOSIS — D649 Anemia, unspecified: Secondary | ICD-10-CM | POA: Diagnosis not present

## 2020-11-29 DIAGNOSIS — S7292XD Unspecified fracture of left femur, subsequent encounter for closed fracture with routine healing: Secondary | ICD-10-CM | POA: Diagnosis not present

## 2020-11-29 DIAGNOSIS — Z7901 Long term (current) use of anticoagulants: Secondary | ICD-10-CM | POA: Diagnosis not present

## 2020-11-29 DIAGNOSIS — F419 Anxiety disorder, unspecified: Secondary | ICD-10-CM | POA: Diagnosis not present

## 2020-11-29 DIAGNOSIS — K219 Gastro-esophageal reflux disease without esophagitis: Secondary | ICD-10-CM | POA: Diagnosis not present

## 2020-11-29 DIAGNOSIS — Z9181 History of falling: Secondary | ICD-10-CM | POA: Diagnosis not present

## 2020-11-30 DIAGNOSIS — I4891 Unspecified atrial fibrillation: Secondary | ICD-10-CM | POA: Diagnosis not present

## 2020-11-30 DIAGNOSIS — K5909 Other constipation: Secondary | ICD-10-CM | POA: Diagnosis not present

## 2020-11-30 DIAGNOSIS — I2510 Atherosclerotic heart disease of native coronary artery without angina pectoris: Secondary | ICD-10-CM | POA: Diagnosis not present

## 2020-11-30 DIAGNOSIS — H9190 Unspecified hearing loss, unspecified ear: Secondary | ICD-10-CM | POA: Diagnosis not present

## 2020-11-30 DIAGNOSIS — S7292XD Unspecified fracture of left femur, subsequent encounter for closed fracture with routine healing: Secondary | ICD-10-CM | POA: Diagnosis not present

## 2020-11-30 DIAGNOSIS — K219 Gastro-esophageal reflux disease without esophagitis: Secondary | ICD-10-CM | POA: Diagnosis not present

## 2020-12-02 DIAGNOSIS — I2510 Atherosclerotic heart disease of native coronary artery without angina pectoris: Secondary | ICD-10-CM | POA: Diagnosis not present

## 2020-12-02 DIAGNOSIS — K219 Gastro-esophageal reflux disease without esophagitis: Secondary | ICD-10-CM | POA: Diagnosis not present

## 2020-12-02 DIAGNOSIS — I4891 Unspecified atrial fibrillation: Secondary | ICD-10-CM | POA: Diagnosis not present

## 2020-12-02 DIAGNOSIS — K5909 Other constipation: Secondary | ICD-10-CM | POA: Diagnosis not present

## 2020-12-02 DIAGNOSIS — S7292XD Unspecified fracture of left femur, subsequent encounter for closed fracture with routine healing: Secondary | ICD-10-CM | POA: Diagnosis not present

## 2020-12-02 DIAGNOSIS — H9190 Unspecified hearing loss, unspecified ear: Secondary | ICD-10-CM | POA: Diagnosis not present

## 2020-12-03 ENCOUNTER — Other Ambulatory Visit: Payer: Self-pay | Admitting: Cardiology

## 2020-12-03 DIAGNOSIS — N1832 Chronic kidney disease, stage 3b: Secondary | ICD-10-CM | POA: Diagnosis not present

## 2020-12-03 DIAGNOSIS — Z8673 Personal history of transient ischemic attack (TIA), and cerebral infarction without residual deficits: Secondary | ICD-10-CM | POA: Diagnosis not present

## 2020-12-03 DIAGNOSIS — I672 Cerebral atherosclerosis: Secondary | ICD-10-CM | POA: Diagnosis not present

## 2020-12-03 DIAGNOSIS — S72002S Fracture of unspecified part of neck of left femur, sequela: Secondary | ICD-10-CM | POA: Diagnosis not present

## 2020-12-03 DIAGNOSIS — G4089 Other seizures: Secondary | ICD-10-CM | POA: Diagnosis not present

## 2020-12-03 NOTE — Telephone Encounter (Signed)
7m, 92.6kg, scr 1.18 09/16/20, lovw/munley 10/07/20

## 2020-12-04 DIAGNOSIS — K5909 Other constipation: Secondary | ICD-10-CM | POA: Diagnosis not present

## 2020-12-04 DIAGNOSIS — I2510 Atherosclerotic heart disease of native coronary artery without angina pectoris: Secondary | ICD-10-CM | POA: Diagnosis not present

## 2020-12-04 DIAGNOSIS — I4891 Unspecified atrial fibrillation: Secondary | ICD-10-CM | POA: Diagnosis not present

## 2020-12-04 DIAGNOSIS — H9190 Unspecified hearing loss, unspecified ear: Secondary | ICD-10-CM | POA: Diagnosis not present

## 2020-12-04 DIAGNOSIS — S7292XD Unspecified fracture of left femur, subsequent encounter for closed fracture with routine healing: Secondary | ICD-10-CM | POA: Diagnosis not present

## 2020-12-04 DIAGNOSIS — K219 Gastro-esophageal reflux disease without esophagitis: Secondary | ICD-10-CM | POA: Diagnosis not present

## 2020-12-07 DIAGNOSIS — I4891 Unspecified atrial fibrillation: Secondary | ICD-10-CM | POA: Diagnosis not present

## 2020-12-07 DIAGNOSIS — I2510 Atherosclerotic heart disease of native coronary artery without angina pectoris: Secondary | ICD-10-CM | POA: Diagnosis not present

## 2020-12-07 DIAGNOSIS — K5909 Other constipation: Secondary | ICD-10-CM | POA: Diagnosis not present

## 2020-12-07 DIAGNOSIS — S7292XD Unspecified fracture of left femur, subsequent encounter for closed fracture with routine healing: Secondary | ICD-10-CM | POA: Diagnosis not present

## 2020-12-07 DIAGNOSIS — H9190 Unspecified hearing loss, unspecified ear: Secondary | ICD-10-CM | POA: Diagnosis not present

## 2020-12-07 DIAGNOSIS — K219 Gastro-esophageal reflux disease without esophagitis: Secondary | ICD-10-CM | POA: Diagnosis not present

## 2020-12-07 DIAGNOSIS — S72002A Fracture of unspecified part of neck of left femur, initial encounter for closed fracture: Secondary | ICD-10-CM | POA: Diagnosis not present

## 2020-12-09 DIAGNOSIS — S7292XD Unspecified fracture of left femur, subsequent encounter for closed fracture with routine healing: Secondary | ICD-10-CM | POA: Diagnosis not present

## 2020-12-09 DIAGNOSIS — I4891 Unspecified atrial fibrillation: Secondary | ICD-10-CM | POA: Diagnosis not present

## 2020-12-09 DIAGNOSIS — K219 Gastro-esophageal reflux disease without esophagitis: Secondary | ICD-10-CM | POA: Diagnosis not present

## 2020-12-09 DIAGNOSIS — I2510 Atherosclerotic heart disease of native coronary artery without angina pectoris: Secondary | ICD-10-CM | POA: Diagnosis not present

## 2020-12-09 DIAGNOSIS — H9190 Unspecified hearing loss, unspecified ear: Secondary | ICD-10-CM | POA: Diagnosis not present

## 2020-12-09 DIAGNOSIS — K5909 Other constipation: Secondary | ICD-10-CM | POA: Diagnosis not present

## 2020-12-10 DIAGNOSIS — S7292XD Unspecified fracture of left femur, subsequent encounter for closed fracture with routine healing: Secondary | ICD-10-CM | POA: Diagnosis not present

## 2020-12-10 DIAGNOSIS — K219 Gastro-esophageal reflux disease without esophagitis: Secondary | ICD-10-CM | POA: Diagnosis not present

## 2020-12-10 DIAGNOSIS — H9190 Unspecified hearing loss, unspecified ear: Secondary | ICD-10-CM | POA: Diagnosis not present

## 2020-12-10 DIAGNOSIS — K5909 Other constipation: Secondary | ICD-10-CM | POA: Diagnosis not present

## 2020-12-10 DIAGNOSIS — I4891 Unspecified atrial fibrillation: Secondary | ICD-10-CM | POA: Diagnosis not present

## 2020-12-10 DIAGNOSIS — I2510 Atherosclerotic heart disease of native coronary artery without angina pectoris: Secondary | ICD-10-CM | POA: Diagnosis not present

## 2020-12-16 DIAGNOSIS — S7292XD Unspecified fracture of left femur, subsequent encounter for closed fracture with routine healing: Secondary | ICD-10-CM | POA: Diagnosis not present

## 2020-12-16 DIAGNOSIS — I2510 Atherosclerotic heart disease of native coronary artery without angina pectoris: Secondary | ICD-10-CM | POA: Diagnosis not present

## 2020-12-16 DIAGNOSIS — I4891 Unspecified atrial fibrillation: Secondary | ICD-10-CM | POA: Diagnosis not present

## 2020-12-16 DIAGNOSIS — H9190 Unspecified hearing loss, unspecified ear: Secondary | ICD-10-CM | POA: Diagnosis not present

## 2020-12-16 DIAGNOSIS — K219 Gastro-esophageal reflux disease without esophagitis: Secondary | ICD-10-CM | POA: Diagnosis not present

## 2020-12-16 DIAGNOSIS — K5909 Other constipation: Secondary | ICD-10-CM | POA: Diagnosis not present

## 2020-12-18 DIAGNOSIS — K219 Gastro-esophageal reflux disease without esophagitis: Secondary | ICD-10-CM | POA: Diagnosis not present

## 2020-12-18 DIAGNOSIS — H9190 Unspecified hearing loss, unspecified ear: Secondary | ICD-10-CM | POA: Diagnosis not present

## 2020-12-18 DIAGNOSIS — I2510 Atherosclerotic heart disease of native coronary artery without angina pectoris: Secondary | ICD-10-CM | POA: Diagnosis not present

## 2020-12-18 DIAGNOSIS — K5909 Other constipation: Secondary | ICD-10-CM | POA: Diagnosis not present

## 2020-12-18 DIAGNOSIS — S7292XD Unspecified fracture of left femur, subsequent encounter for closed fracture with routine healing: Secondary | ICD-10-CM | POA: Diagnosis not present

## 2020-12-18 DIAGNOSIS — I4891 Unspecified atrial fibrillation: Secondary | ICD-10-CM | POA: Diagnosis not present

## 2020-12-21 DIAGNOSIS — K219 Gastro-esophageal reflux disease without esophagitis: Secondary | ICD-10-CM | POA: Diagnosis not present

## 2020-12-21 DIAGNOSIS — I4891 Unspecified atrial fibrillation: Secondary | ICD-10-CM | POA: Diagnosis not present

## 2020-12-21 DIAGNOSIS — H9190 Unspecified hearing loss, unspecified ear: Secondary | ICD-10-CM | POA: Diagnosis not present

## 2020-12-21 DIAGNOSIS — I2510 Atherosclerotic heart disease of native coronary artery without angina pectoris: Secondary | ICD-10-CM | POA: Diagnosis not present

## 2020-12-21 DIAGNOSIS — K5909 Other constipation: Secondary | ICD-10-CM | POA: Diagnosis not present

## 2020-12-21 DIAGNOSIS — S7292XD Unspecified fracture of left femur, subsequent encounter for closed fracture with routine healing: Secondary | ICD-10-CM | POA: Diagnosis not present

## 2020-12-23 DIAGNOSIS — K5909 Other constipation: Secondary | ICD-10-CM | POA: Diagnosis not present

## 2020-12-23 DIAGNOSIS — I2510 Atherosclerotic heart disease of native coronary artery without angina pectoris: Secondary | ICD-10-CM | POA: Diagnosis not present

## 2020-12-23 DIAGNOSIS — S7292XD Unspecified fracture of left femur, subsequent encounter for closed fracture with routine healing: Secondary | ICD-10-CM | POA: Diagnosis not present

## 2020-12-23 DIAGNOSIS — K219 Gastro-esophageal reflux disease without esophagitis: Secondary | ICD-10-CM | POA: Diagnosis not present

## 2020-12-23 DIAGNOSIS — I4891 Unspecified atrial fibrillation: Secondary | ICD-10-CM | POA: Diagnosis not present

## 2020-12-23 DIAGNOSIS — H9190 Unspecified hearing loss, unspecified ear: Secondary | ICD-10-CM | POA: Diagnosis not present

## 2020-12-24 DIAGNOSIS — S7292XD Unspecified fracture of left femur, subsequent encounter for closed fracture with routine healing: Secondary | ICD-10-CM | POA: Diagnosis not present

## 2020-12-24 DIAGNOSIS — I4891 Unspecified atrial fibrillation: Secondary | ICD-10-CM | POA: Diagnosis not present

## 2020-12-24 DIAGNOSIS — K5909 Other constipation: Secondary | ICD-10-CM | POA: Diagnosis not present

## 2020-12-24 DIAGNOSIS — K219 Gastro-esophageal reflux disease without esophagitis: Secondary | ICD-10-CM | POA: Diagnosis not present

## 2020-12-24 DIAGNOSIS — H9190 Unspecified hearing loss, unspecified ear: Secondary | ICD-10-CM | POA: Diagnosis not present

## 2020-12-24 DIAGNOSIS — I2510 Atherosclerotic heart disease of native coronary artery without angina pectoris: Secondary | ICD-10-CM | POA: Diagnosis not present

## 2020-12-28 DIAGNOSIS — I4891 Unspecified atrial fibrillation: Secondary | ICD-10-CM | POA: Diagnosis not present

## 2020-12-28 DIAGNOSIS — K219 Gastro-esophageal reflux disease without esophagitis: Secondary | ICD-10-CM | POA: Diagnosis not present

## 2020-12-28 DIAGNOSIS — S7292XD Unspecified fracture of left femur, subsequent encounter for closed fracture with routine healing: Secondary | ICD-10-CM | POA: Diagnosis not present

## 2020-12-28 DIAGNOSIS — I2510 Atherosclerotic heart disease of native coronary artery without angina pectoris: Secondary | ICD-10-CM | POA: Diagnosis not present

## 2020-12-28 DIAGNOSIS — H9190 Unspecified hearing loss, unspecified ear: Secondary | ICD-10-CM | POA: Diagnosis not present

## 2020-12-28 DIAGNOSIS — K5909 Other constipation: Secondary | ICD-10-CM | POA: Diagnosis not present

## 2020-12-29 ENCOUNTER — Other Ambulatory Visit: Payer: Self-pay

## 2020-12-29 ENCOUNTER — Encounter: Payer: Self-pay | Admitting: Neurology

## 2020-12-29 ENCOUNTER — Ambulatory Visit (INDEPENDENT_AMBULATORY_CARE_PROVIDER_SITE_OTHER): Payer: Medicare Other | Admitting: Neurology

## 2020-12-29 VITALS — BP 123/67 | HR 69 | Ht 74.0 in | Wt 189.0 lb

## 2020-12-29 DIAGNOSIS — H52223 Regular astigmatism, bilateral: Secondary | ICD-10-CM | POA: Diagnosis not present

## 2020-12-29 DIAGNOSIS — Z961 Presence of intraocular lens: Secondary | ICD-10-CM | POA: Diagnosis not present

## 2020-12-29 DIAGNOSIS — I2510 Atherosclerotic heart disease of native coronary artery without angina pectoris: Secondary | ICD-10-CM | POA: Diagnosis not present

## 2020-12-29 DIAGNOSIS — I951 Orthostatic hypotension: Secondary | ICD-10-CM

## 2020-12-29 DIAGNOSIS — K219 Gastro-esophageal reflux disease without esophagitis: Secondary | ICD-10-CM | POA: Diagnosis not present

## 2020-12-29 DIAGNOSIS — I25810 Atherosclerosis of coronary artery bypass graft(s) without angina pectoris: Secondary | ICD-10-CM

## 2020-12-29 DIAGNOSIS — Z9842 Cataract extraction status, left eye: Secondary | ICD-10-CM | POA: Diagnosis not present

## 2020-12-29 DIAGNOSIS — F419 Anxiety disorder, unspecified: Secondary | ICD-10-CM | POA: Diagnosis not present

## 2020-12-29 DIAGNOSIS — R200 Anesthesia of skin: Secondary | ICD-10-CM

## 2020-12-29 DIAGNOSIS — H35363 Drusen (degenerative) of macula, bilateral: Secondary | ICD-10-CM | POA: Diagnosis not present

## 2020-12-29 DIAGNOSIS — I10 Essential (primary) hypertension: Secondary | ICD-10-CM | POA: Diagnosis not present

## 2020-12-29 DIAGNOSIS — I480 Paroxysmal atrial fibrillation: Secondary | ICD-10-CM

## 2020-12-29 DIAGNOSIS — Z9181 History of falling: Secondary | ICD-10-CM | POA: Diagnosis not present

## 2020-12-29 DIAGNOSIS — Z8673 Personal history of transient ischemic attack (TIA), and cerebral infarction without residual deficits: Secondary | ICD-10-CM | POA: Diagnosis not present

## 2020-12-29 DIAGNOSIS — Z7901 Long term (current) use of anticoagulants: Secondary | ICD-10-CM | POA: Diagnosis not present

## 2020-12-29 DIAGNOSIS — K5909 Other constipation: Secondary | ICD-10-CM | POA: Diagnosis not present

## 2020-12-29 DIAGNOSIS — M810 Age-related osteoporosis without current pathological fracture: Secondary | ICD-10-CM | POA: Diagnosis not present

## 2020-12-29 DIAGNOSIS — H524 Presbyopia: Secondary | ICD-10-CM | POA: Diagnosis not present

## 2020-12-29 DIAGNOSIS — H5212 Myopia, left eye: Secondary | ICD-10-CM | POA: Diagnosis not present

## 2020-12-29 DIAGNOSIS — R2681 Unsteadiness on feet: Secondary | ICD-10-CM | POA: Diagnosis not present

## 2020-12-29 DIAGNOSIS — Z9841 Cataract extraction status, right eye: Secondary | ICD-10-CM | POA: Diagnosis not present

## 2020-12-29 DIAGNOSIS — S7292XD Unspecified fracture of left femur, subsequent encounter for closed fracture with routine healing: Secondary | ICD-10-CM | POA: Diagnosis not present

## 2020-12-29 DIAGNOSIS — H5201 Hypermetropia, right eye: Secondary | ICD-10-CM | POA: Diagnosis not present

## 2020-12-29 DIAGNOSIS — H9190 Unspecified hearing loss, unspecified ear: Secondary | ICD-10-CM | POA: Diagnosis not present

## 2020-12-29 DIAGNOSIS — D649 Anemia, unspecified: Secondary | ICD-10-CM | POA: Diagnosis not present

## 2020-12-29 DIAGNOSIS — I4891 Unspecified atrial fibrillation: Secondary | ICD-10-CM | POA: Diagnosis not present

## 2020-12-29 DIAGNOSIS — H353132 Nonexudative age-related macular degeneration, bilateral, intermediate dry stage: Secondary | ICD-10-CM | POA: Diagnosis not present

## 2020-12-29 HISTORY — DX: Paroxysmal atrial fibrillation: I48.0

## 2020-12-29 HISTORY — DX: Anesthesia of skin: R20.0

## 2020-12-29 MED ORDER — PYRIDOSTIGMINE BROMIDE 60 MG PO TABS: ORAL_TABLET | ORAL | 5 refills | Status: AC

## 2020-12-29 NOTE — Progress Notes (Signed)
[facility]  [name] DOB: 1923-05-22  REFERRING DOCTOR OR PCP:   Dr. Casper Harrison (PCP); Dr, Dulce Sellar (cardiology) SOURCE:    _________________________________   HISTORICAL  CHIEF COMPLAINT:  Chief Complaint  Patient presents with   New Patient (Initial Visit)    RM 28 w/ grandson. Paper referral from [employer], MD for seizure, L arm numbness that has been ongoing for the last 3 years. Episodes are intermittent, can last 2 hours. He loses motor skills/speech. Hospital thinks it may be seizures. Unable to do MRI's d/t severe claustrophobia. Did not have EEG.    HISTORY OF PRESENT ILLNESS:  I had the pleasure of seeing your patient, Joshua Chambers, at [facility] for neurologic consultation regarding his episodes of left arm numbness sometimes associated with additional neurologic symptoms     [name] is a 87 year old man who has had frequent episodes of numbness in the left arm..   The first spell occurred about 3 years ago while he was moving homes and he was helping some.   This episode lasted one day and then resolved.   He has had multiple spells, probably now occurring a couple times a week for 1 to 2 hours.  Most spells occur in the morning after breakfast and  he takes his am medications.  He is always sitting during the more recent spells.   He notes numbness but no weakness.  There were concerns that the episodes were due to strokes or TIAs.  He is very claustophobic and has had only one good MRI in 2018.   It was actually fairly normal for age.  He has had a couple CT scans.   Scans are normal for age.  There were no acute changes.    He has never had GTC but seizures were also tossed out once as a possible explanation.      Spells always occur in the morning.  He gets out of bed at 7:15 a.m., he usually has some cereal while his wife is getting up.  He takes his medications around 8:00 a.m, concurrently with having breakfast  with his wife and spells are usually 8:30 am.  For breakfast, he usually has cereal, pastry and orange juice.   Most episodes only involve numbness but one episode also was associated with some slurred speech and slobbering some.    Episodes are usually resolved by 10 AM and he has no further numbness.  He is on proamatine and fludrocortisone for orthostatic hypotension.    BP is always lower in the mornings than later on in the day.    Typical readings in the morning might be 80/50.      MRI of the head 03/16/2017 showed mild generalized cortical atrophy and some T2/flair hyperintense foci predominantly in  the periatrial deep white matter consistent with chronic microvascular ischemic change.  No acute findings.  MRA of the head 03/16/2017 was essentially normal.  CT scan of the head 11/09/2020 showed mild atrophy, periatrial white matter changes and and no acute findings  He has CAD and is s/p CABG.   He is on anticoag for A-Fib.   He sees Dr, Dulce Sellar  He recently fell and broke his left hip requiring surgery.   He went to a SNF but is still only able to walk with strong support.    REVIEW OF SYSTEMS: Constitutional: No fevers, chills, sweats, or change in appetite Eyes: No visual changes, double vision, eye pain Ear, nose  and throat: No hearing loss, ear pain, nasal congestion, sore throat Cardiovascular: No chest pain.  He has atrial fibrillation. Respiratory:  No shortness of breath at rest or with exertion.   No wheezes GastrointestinaI: No nausea, vomiting, diarrhea, abdominal pain, fecal incontinence Genitourinary:  No dysuria, urinary retention or frequency.  No nocturia. Musculoskeletal:  No neck pain, back pain.  He has left hip pain after his fracture. Integumentary: No rash, pruritus, skin lesions Neurological: as above Psychiatric: No depression at this time.  No anxiety Endocrine: No palpitations, diaphoresis, change in appetite, change in weigh or increased  thirst Hematologic/Lymphatic:  No anemia, purpura, petechiae. Allergic/Immunologic: No itchy/runny eyes, nasal congestion, recent allergic reactions, rashes  ALLERGIES: Allergies  Allergen Reactions   Amiodarone Other (See Comments)    Extreme dehydration   Diltiazem Other (See Comments)    Pt unsure of reaction   Statins    Sulfa Antibiotics Other (See Comments)    Pt unsure of reaction    HOME MEDICATIONS:  Current Outpatient Medications:    acetaminophen (TYLENOL) 500 MG tablet, Take 1,000 mg by mouth at bedtime., Disp: , Rfl:    atorvastatin (LIPITOR) 40 MG tablet, TAKE 1/2 OF A TABLET BY MOUTH DAILY AT 6:00PM, Disp: 90 tablet, Rfl: 1   ELIQUIS 5 MG TABS tablet, TAKE 1 TABLET(5 MG) BY MOUTH TWICE DAILY, Disp: 180 tablet, Rfl: 1   fludrocortisone (FLORINEF) 0.1 MG tablet, Take 1 tablet (0.1 mg total) by mouth daily., Disp: 90 tablet, Rfl: 3   metoprolol tartrate (LOPRESSOR) 25 MG tablet, TAKE 1/2 TABLET(12.5 MG) BY MOUTH TWICE DAILY, Disp: 90 tablet, Rfl: 1   midodrine (PROAMATINE) 10 MG tablet, TAKE 1 TABLET(10 MG) BY MOUTH FOUR TIMES DAILY, Disp: 360 tablet, Rfl: 0   nitroGLYCERIN (NITROSTAT) 0.4 MG SL tablet, Place 1 tablet (0.4 mg total) under the tongue every 5 (five) minutes as needed for chest pain., Disp: 25 tablet, Rfl: 6   omeprazole (PRILOSEC) 20 MG capsule, Take 20 mg by mouth every morning., Disp: , Rfl:    Polyvinyl Alcohol-Povidone (REFRESH OP), Place 1 drop into both eyes daily as needed., Disp: , Rfl:    PULMICORT FLEXHALER 180 MCG/ACT inhaler, Inhale 2 puffs into the lungs daily. , Disp: , Rfl:    pyridostigmine (MESTINON) 60 MG tablet, 1/2 to 1 pill po tid, Disp: 90 tablet, Rfl: 5  PAST MEDICAL HISTORY: Past Medical History:  Diagnosis Date   Arteriosclerosis of arterial coronary artery bypass graft    Arthritis    Atrial contractions, premature    Atrial fibrillation (HCC)    Benign hypertension    Bilateral carotid artery stenosis    CAD (coronary  artery disease), autologous vein bypass graft 04/19/2013   100% flush occlusion of SVG-OM & SVG-Diag; near Subtotal Occlusion of SVG-RCA (PCI of RCA done to avoid distal embolization with PCI to SVG)   CAD (coronary artery disease), native coronary artery 2007   Multivessel CAD referred for CABG   CAD S/P percutaneous coronary angioplasty  04/19/2013   PCI of Native RCA - Promus Premier DES 3.0 mm x 38 mm (3.6 mm);    Chronic anticoagulation 04/17/2018   Chronic combined systolic and diastolic heart failure (HCC)    Coronary artery disease involving coronary bypass graft of native heart without angina pectoris; CAD (coronary artery disease) of artery bypass graft:  loss of VG to RCA and VG-Diag 04/17/2013   Dyslipidemia    Dyslipidemia, goal LDL below 70    Dyspnea 04/30/2013  Essential hypertension 05/02/2013   Fatigue 04/17/2013   GERD (gastroesophageal reflux disease)    Hematuria 05/02/2013   History of DVT of lower extremity    Right popliteal vein   History of stroke August 2011   Possible TIA in 2001;  Echo: EF 45-50%, mild HK of anterior septum.   History of transient cerebral ischemia    Hx of orthostatic hypotension 2009   Positive tilt table:  dizziness/vasopressor type; previously on the decision    Hx SBO    Ischemic cardiomyopathy 02/06/2018   Nephrolithiasis    Orthostatic hypotension    Other and unspecified coagulation defects 04/17/2013   PAF (paroxysmal atrial fibrillation) (HCC) 02/06/2018   Premature atrial contractions 07/28/2015   Preoperative examination, unspecified 04/17/2013   S/P CABG x 3 2007   LIMA-LAD, SVG-DIAG, SVG-RCA   S/P CABG x 4; LIMA-LAD, SVG-RPDA, SVG-OM, SVG-D1 -- all but LIMA-LAD occluded 03/18/2006   03/2006 - LIMA-LAD, SVG-D1, SVG-RPDA    Sinus arrhythmia 09/09/2016   Stroke [facility])    TIA (transient ischemic attack) 03/16/2017   Unsteady gait     PAST SURGICAL HISTORY: Past Surgical History:  Procedure Laterality Date   APPENDECTOMY      CARDIAC SURGERY  2000s   COLON SURGERY     CORONARY ANGIOPLASTY WITH STENT PLACEMENT  04/19/13   Stent-Promus DES (3.0 mm x 38 mm - 3.6 mm) to native RCA prox-mid,   CORONARY ARTERY BYPASS GRAFT  2007   LIMA-LAD, SVG-D1, SVG-RCA   LEFT HEART CATHETERIZATION WITH CORONARY/GRAFT ANGIOGRAM Left 04/19/2013   LEFT HEART CATH W/ CORONARY/GRAFT ANGIOGRAM;  [name], MD;  Location: [facility] CATH LAB;  LM 20%-->LAD 70-80% then subTO after D1 (Patent LIMA-LAD); Small non-dom Cx ~20% ostial.  RCA long 60-70% prox --> 90% mid.  SVG-OM & SVG-Diag 100%, diffuse Subtotal occlusion of SVG-rPD   STENT PLACEMENT VASCULAR ([facility] HX)     TONSILLECTOMY      FAMILY HISTORY: Family History  Problem Relation Age of Onset   Colon cancer Father    Hypertension Son     SOCIAL HISTORY:  Social History   Socioeconomic History   Marital status: Married    Spouse name: Not on file   Number of children: Not on file   Years of education: Not on file   Highest education level: Not on file  Occupational History   Not on file  Tobacco Use   Smoking status: Never   Smokeless tobacco: Never  Vaping Use   Vaping Use: Never used  Substance and Sexual Activity   Alcohol use: Yes    Comment: beer w/ pizza rarely   Drug use: No   Sexual activity: Not on file  Other Topics Concern   Not on file  Social History Narrative    He is now remarried for the last 2 years, his first wife of 60 years is deceased. He has 1child from that first marriage and 2 grandchildren. He does not smoke, does not drink. He is not very active but he does kind of meander around with his new wife who is permanently disabled.    Right handed   Caffeine use: coffee/soda daily   Social Determinants of Health   Financial Resource Strain: Not on file  Food Insecurity: Not on file  Transportation Needs: Not on file  Physical Activity: Not on file  Stress: Not on file  Social Connections: Not on file  Intimate Partner Violence: Not on file  PHYSICAL EXAM  Vitals:   12/29/20 1447  BP: 123/67  Pulse: 69  SpO2: 97%  Weight: 189 lb (85.7 kg)  Height: 6\' 2"  (1.88 m)    Body mass index is 24.27 kg/m.   General: The patient is well-developed and well-nourished and in no acute distress.  He is in a wheelchair.  HEENT:  Head is [place]/AT.  Sclera are anicteric.  He just had an eye exam earlier today and there is mild anisocoria.  Eyes have been dilated.    Neck: No carotid bruits are noted.  The neck is nontender.  Cardiovascular: Irregular irregular heart rate.  Normal S1 and S2. There were no murmurs, gallops or rubs.    Skin: Extremities are without rash or  edema.  Neurologic Exam  Mental status: The patient is alert and oriented x 3 at the time of the examination. The patient has apparent normal recent and remote memory, with an apparently normal attention span and concentration ability.   Speech is normal.  Cranial nerves: Extraocular movements are full. Pupils are equal, round, and reactive to light and accomodation.  Visual fields are full.  Facial symmetry is present. There is good facial sensation to soft touch bilaterally.Facial strength is normal.  Trapezius and sternocleidomastoid strength is normal. No dysarthria is noted.  The tongue is midline, and the patient has symmetric elevation of the soft palate. No obvious hearing deficits are noted.  Motor:  Muscle bulk is normal.   Tone is normal. Strength is  5 / 5 in all 4 extremities.   Sensory: Sensory testing is intact to pinprick, soft touch and vibration sensation in the arms.  He has 50% reduction of vibration at the toes and normal vibration sensation at the ankles.  Pinprick sensation was normal in the fourth and fifth toes but reduced in the left first second and third toe.  Coordination: Cerebellar testing reveals good finger-nose-finger and heel-to-shin bilaterally.  Gait and station: He has difficulty getting out of the wheelchair and needs  bilateral support.  Reflexes: Deep tendon reflexes are symmetric and normal bilaterally.   Plantar responses are flexor.    DIAGNOSTIC DATA (LABS, IMAGING, TESTING) - I reviewed patient records, labs, notes, testing and imaging myself where available.  Lab Results  Component Value Date   WBC 6.4 09/16/2020   HGB 10.3 (L) 09/16/2020   HCT 32.2 (L) 09/16/2020   MCV 90 09/16/2020   PLT 343 09/16/2020      Component Value Date/Time   NA 141 09/16/2020 1533   K 4.7 09/16/2020 1533   CL 104 09/16/2020 1533   CO2 20 09/16/2020 1533   GLUCOSE 106 (H) 09/16/2020 1533   GLUCOSE 106 (H) 03/17/2017 0930   BUN 22 09/16/2020 1533   CREATININE 1.18 09/16/2020 1533   CREATININE 0.96 04/17/2013 1645   CALCIUM 9.3 09/16/2020 1533   PROT 6.8 04/16/2020 0942   ALBUMIN 4.5 04/16/2020 0942   AST 16 04/16/2020 0942   ALT 9 04/16/2020 0942   ALKPHOS 78 04/16/2020 0942   BILITOT 0.6 04/16/2020 0942   GFRNONAA 57 (L) 04/16/2020 0942   GFRAA 65 04/16/2020 0942   Lab Results  Component Value Date   CHOL 168 04/16/2020   HDL 46 04/16/2020   LDLCALC 96 04/16/2020   TRIG 147 04/16/2020   CHOLHDL 3.7 04/16/2020   Lab Results  Component Value Date   HGBA1C 5.5 03/17/2017   No results found for: 03/19/2017 Lab Results  Component Value Date   TSH  1.439 04/17/2013       ASSESSMENT AND PLAN  Left arm numbness  Orthostatic hypotension  Paroxysmal atrial fibrillation (HCC)  Unsteady gait   In summary, Mr. Leather is a 87 year old man with near daily episodes of left arm numbness that occur every morning shortly after breakfast.  The description of the events and their regular occurrence makes TIA or seizure unlikely.    I carefully reviewed the timing of his medications, meals and symptoms with his grandson (he was texting back-and-forth to his grandmother who also provided information).  Of note, he eats a high carbohydrate breakfast at the same time he takes his medication and  symptoms usually occur 10 to 15 minutes later.  He is possible that he has postprandial hypotension.  I advised that he take his ProAmatine and fludrocortisone in the morning 30 to 45 minutes before eating breakfast in the hope that the medications will have be effective by that time.  If the simple measures does not help to reduce the number of severity of the episodes, then I will have him start pyridostigmine 30 to 60 mg 3 times daily.  Although seizure is unlikely, if he has more episodes with atypical symptoms such as slurred speech or if there is any altered mental status then I will recommend that we check an EEG.  They will return to see me in 3 to 4 months or sooner if there are new or worsening neurologic symptoms.   Crescentia Boutwell A. Epimenio Foot, MD, [facility] 12/29/2020, 8:22 PM Certified in Neurology, Clinical Neurophysiology, Sleep Medicine and Neuroimaging  [facility] [address] [place], [place] [address] [phone]

## 2020-12-31 DIAGNOSIS — H9190 Unspecified hearing loss, unspecified ear: Secondary | ICD-10-CM | POA: Diagnosis not present

## 2020-12-31 DIAGNOSIS — I2510 Atherosclerotic heart disease of native coronary artery without angina pectoris: Secondary | ICD-10-CM | POA: Diagnosis not present

## 2020-12-31 DIAGNOSIS — K219 Gastro-esophageal reflux disease without esophagitis: Secondary | ICD-10-CM | POA: Diagnosis not present

## 2020-12-31 DIAGNOSIS — I4891 Unspecified atrial fibrillation: Secondary | ICD-10-CM | POA: Diagnosis not present

## 2020-12-31 DIAGNOSIS — S7292XD Unspecified fracture of left femur, subsequent encounter for closed fracture with routine healing: Secondary | ICD-10-CM | POA: Diagnosis not present

## 2020-12-31 DIAGNOSIS — K5909 Other constipation: Secondary | ICD-10-CM | POA: Diagnosis not present

## 2021-01-01 DIAGNOSIS — K219 Gastro-esophageal reflux disease without esophagitis: Secondary | ICD-10-CM | POA: Diagnosis not present

## 2021-01-01 DIAGNOSIS — S7292XD Unspecified fracture of left femur, subsequent encounter for closed fracture with routine healing: Secondary | ICD-10-CM | POA: Diagnosis not present

## 2021-01-01 DIAGNOSIS — K5909 Other constipation: Secondary | ICD-10-CM | POA: Diagnosis not present

## 2021-01-01 DIAGNOSIS — I4891 Unspecified atrial fibrillation: Secondary | ICD-10-CM | POA: Diagnosis not present

## 2021-01-01 DIAGNOSIS — I2510 Atherosclerotic heart disease of native coronary artery without angina pectoris: Secondary | ICD-10-CM | POA: Diagnosis not present

## 2021-01-01 DIAGNOSIS — H9190 Unspecified hearing loss, unspecified ear: Secondary | ICD-10-CM | POA: Diagnosis not present

## 2021-01-04 DIAGNOSIS — K219 Gastro-esophageal reflux disease without esophagitis: Secondary | ICD-10-CM | POA: Diagnosis not present

## 2021-01-04 DIAGNOSIS — I4891 Unspecified atrial fibrillation: Secondary | ICD-10-CM | POA: Diagnosis not present

## 2021-01-04 DIAGNOSIS — I2510 Atherosclerotic heart disease of native coronary artery without angina pectoris: Secondary | ICD-10-CM | POA: Diagnosis not present

## 2021-01-04 DIAGNOSIS — H9190 Unspecified hearing loss, unspecified ear: Secondary | ICD-10-CM | POA: Diagnosis not present

## 2021-01-04 DIAGNOSIS — K5909 Other constipation: Secondary | ICD-10-CM | POA: Diagnosis not present

## 2021-01-04 DIAGNOSIS — S7292XD Unspecified fracture of left femur, subsequent encounter for closed fracture with routine healing: Secondary | ICD-10-CM | POA: Diagnosis not present

## 2021-01-05 DIAGNOSIS — K219 Gastro-esophageal reflux disease without esophagitis: Secondary | ICD-10-CM | POA: Diagnosis not present

## 2021-01-05 DIAGNOSIS — I4891 Unspecified atrial fibrillation: Secondary | ICD-10-CM | POA: Diagnosis not present

## 2021-01-05 DIAGNOSIS — S7292XD Unspecified fracture of left femur, subsequent encounter for closed fracture with routine healing: Secondary | ICD-10-CM | POA: Diagnosis not present

## 2021-01-05 DIAGNOSIS — I2510 Atherosclerotic heart disease of native coronary artery without angina pectoris: Secondary | ICD-10-CM | POA: Diagnosis not present

## 2021-01-05 DIAGNOSIS — H9190 Unspecified hearing loss, unspecified ear: Secondary | ICD-10-CM | POA: Diagnosis not present

## 2021-01-05 DIAGNOSIS — K5909 Other constipation: Secondary | ICD-10-CM | POA: Diagnosis not present

## 2021-01-06 DIAGNOSIS — S7292XD Unspecified fracture of left femur, subsequent encounter for closed fracture with routine healing: Secondary | ICD-10-CM | POA: Diagnosis not present

## 2021-01-06 DIAGNOSIS — I2510 Atherosclerotic heart disease of native coronary artery without angina pectoris: Secondary | ICD-10-CM | POA: Diagnosis not present

## 2021-01-06 DIAGNOSIS — I4891 Unspecified atrial fibrillation: Secondary | ICD-10-CM | POA: Diagnosis not present

## 2021-01-06 DIAGNOSIS — H9190 Unspecified hearing loss, unspecified ear: Secondary | ICD-10-CM | POA: Diagnosis not present

## 2021-01-06 DIAGNOSIS — K5909 Other constipation: Secondary | ICD-10-CM | POA: Diagnosis not present

## 2021-01-06 DIAGNOSIS — K219 Gastro-esophageal reflux disease without esophagitis: Secondary | ICD-10-CM | POA: Diagnosis not present

## 2021-01-07 DIAGNOSIS — K219 Gastro-esophageal reflux disease without esophagitis: Secondary | ICD-10-CM | POA: Diagnosis not present

## 2021-01-07 DIAGNOSIS — I4891 Unspecified atrial fibrillation: Secondary | ICD-10-CM | POA: Diagnosis not present

## 2021-01-07 DIAGNOSIS — H9190 Unspecified hearing loss, unspecified ear: Secondary | ICD-10-CM | POA: Diagnosis not present

## 2021-01-07 DIAGNOSIS — I2510 Atherosclerotic heart disease of native coronary artery without angina pectoris: Secondary | ICD-10-CM | POA: Diagnosis not present

## 2021-01-07 DIAGNOSIS — K5909 Other constipation: Secondary | ICD-10-CM | POA: Diagnosis not present

## 2021-01-07 DIAGNOSIS — S7292XD Unspecified fracture of left femur, subsequent encounter for closed fracture with routine healing: Secondary | ICD-10-CM | POA: Diagnosis not present

## 2021-01-13 DIAGNOSIS — I4891 Unspecified atrial fibrillation: Secondary | ICD-10-CM | POA: Diagnosis not present

## 2021-01-13 DIAGNOSIS — S7292XD Unspecified fracture of left femur, subsequent encounter for closed fracture with routine healing: Secondary | ICD-10-CM | POA: Diagnosis not present

## 2021-01-13 DIAGNOSIS — H9190 Unspecified hearing loss, unspecified ear: Secondary | ICD-10-CM | POA: Diagnosis not present

## 2021-01-13 DIAGNOSIS — K5909 Other constipation: Secondary | ICD-10-CM | POA: Diagnosis not present

## 2021-01-13 DIAGNOSIS — K219 Gastro-esophageal reflux disease without esophagitis: Secondary | ICD-10-CM | POA: Diagnosis not present

## 2021-01-13 DIAGNOSIS — I2510 Atherosclerotic heart disease of native coronary artery without angina pectoris: Secondary | ICD-10-CM | POA: Diagnosis not present

## 2021-01-13 NOTE — Progress Notes (Signed)
Cardiology Office Note:    Date:  01/14/2021   ID:  Joshua Chambers, Joshua Chambers October 06, 1922, MRN [id number]  PCP:  Street, Stephanie Coup, MD  Cardiologist:  Norman Herrlich, MD    Referring MD: [address], Stephanie Coup, *    ASSESSMENT:    1. Paroxysmal atrial fibrillation (HCC)   2. Chronic anticoagulation   3. Orthostatic hypotension   4. Coronary artery disease involving coronary bypass graft of native heart without angina pectoris; CAD (coronary artery disease) of artery bypass graft:  loss of VG to RCA and VG-Diag   5. Dyslipidemia, goal LDL below 70    PLAN:    In order of problems listed above:  Stable rate is controlled without suppressant treatment continue anticoagulant we will transition to reduced dose with age and renal insufficiency from a safety perspective BP at target tolerates minimum dose beta-blocker and as blood pressure support for midodrine for severe symptomatic orthostatic hypotension Stable CAD asymptomatic continue medical therapy he has had no anginal discomfort Continue statin lipid profile 04/16/2020 LDL 96 cholesterol 168   Next appointment: 6 months   Medication Adjustments/Labs and Tests Ordered: Current medicines are reviewed at length with the patient today.  Concerns regarding medicines are outlined above.  No orders of the defined types were placed in this encounter.  Meds ordered this encounter  Medications   apixaban (ELIQUIS) 2.5 MG TABS tablet    Sig: Take 1 tablet (2.5 mg total) by mouth 2 (two) times daily.    Dispense:  180 tablet    Refill:  3    No chief complaint on file.   History of Present Illness:    Joshua Chambers is a 87 y.o. male with a hx of CAD with remote CABG in 2007 hypertensive heart disease symptomatic orthostatic hypotension requiring midodrine therapy paroxysmal atrial fibrillation with TIA and GI bleeding requiring transfusion last seen 10/07/2020.  Compliance with diet, lifestyle and medications: Yes  Since last  seen he had a fall fractured hip ORIF 10/25/2020 and after 1 month in rehab and returned home. He is seen by neurology who does not think he has a seizure disorder. Despite midodrine he still has a problem with lightheadedness first thing in the morning when he is out of bed. He has had no syncope shortness of breath edema chest pain or palpitation. Chest x-ray 11/09/2020 showed no acute changes his EKG that day showed atrial fibrillation with a controlled ventricular rate. He has had no bleeding from his anticoagulant but with age and renal insufficiency we will transition to reduced dose Eliquis. Recent labs performed in his ED visit showed a hemoglobin of 9.8 creatinine 1.30 potassium 4.4 Past Medical History:  Diagnosis Date   Arteriosclerosis of arterial coronary artery bypass graft    Arthritis    Atrial contractions, premature    Atrial fibrillation (HCC)    Benign hypertension    Bilateral carotid artery stenosis    CAD (coronary artery disease), autologous vein bypass graft 04/19/2013   100% flush occlusion of SVG-OM & SVG-Diag; near Subtotal Occlusion of SVG-RCA (PCI of RCA done to avoid distal embolization with PCI to SVG)   CAD (coronary artery disease), native coronary artery 2007   Multivessel CAD referred for CABG   CAD S/P percutaneous coronary angioplasty  04/19/2013   PCI of Native RCA - Promus Premier DES 3.0 mm x 38 mm (3.6 mm);    Chronic anticoagulation 04/17/2018   Chronic combined systolic and diastolic heart failure (HCC)  Coronary artery disease involving coronary bypass graft of native heart without angina pectoris; CAD (coronary artery disease) of artery bypass graft:  loss of VG to RCA and VG-Diag 04/17/2013   Dyslipidemia    Dyslipidemia, goal LDL below 70    Dyspnea 04/30/2013   Essential hypertension 05/02/2013   Fatigue 04/17/2013   GERD (gastroesophageal reflux disease)    Hematuria 05/02/2013   History of DVT of lower extremity    Right popliteal vein    History of stroke August 2011   Possible TIA in 2001;  Echo: EF 45-50%, mild HK of anterior septum.   History of transient cerebral ischemia    Hx of orthostatic hypotension 2009   Positive tilt table:  dizziness/vasopressor type; previously on the decision    Hx SBO    Ischemic cardiomyopathy 02/06/2018   Nephrolithiasis    Orthostatic hypotension    Other and unspecified coagulation defects 04/17/2013   PAF (paroxysmal atrial fibrillation) (HCC) 02/06/2018   Premature atrial contractions 07/28/2015   Preoperative examination, unspecified 04/17/2013   S/P CABG x 3 2007   LIMA-LAD, SVG-DIAG, SVG-RCA   S/P CABG x 4; LIMA-LAD, SVG-RPDA, SVG-OM, SVG-D1 -- all but LIMA-LAD occluded 03/18/2006   03/2006 - LIMA-LAD, SVG-D1, SVG-RPDA    Sinus arrhythmia 09/09/2016   Stroke [facility])    TIA (transient ischemic attack) 03/16/2017   Unsteady gait     Past Surgical History:  Procedure Laterality Date   APPENDECTOMY     CARDIAC SURGERY  2000s   COLON SURGERY     CORONARY ANGIOPLASTY WITH STENT PLACEMENT  04/19/13   Stent-Promus DES (3.0 mm x 38 mm - 3.6 mm) to native RCA prox-mid,   CORONARY ARTERY BYPASS GRAFT  2007   LIMA-LAD, SVG-D1, SVG-RCA   LEFT HEART CATHETERIZATION WITH CORONARY/GRAFT ANGIOGRAM Left 04/19/2013   LEFT HEART CATH W/ CORONARY/GRAFT ANGIOGRAM;  [name], MD;  Location: [facility] CATH LAB;  LM 20%-->LAD 70-80% then subTO after D1 (Patent LIMA-LAD); Small non-dom Cx ~20% ostial.  RCA long 60-70% prox --> 90% mid.  SVG-OM & SVG-Diag 100%, diffuse Subtotal occlusion of SVG-rPD   STENT PLACEMENT VASCULAR ([facility] HX)     TONSILLECTOMY      Current Medications: Current Meds  Medication Sig   acetaminophen (TYLENOL) 325 MG tablet Take 325 mg by mouth every 8 (eight) hours as needed for mild pain.   apixaban (ELIQUIS) 2.5 MG TABS tablet Take 1 tablet (2.5 mg total) by mouth 2 (two) times daily.   atorvastatin (LIPITOR) 40 MG tablet TAKE 1/2 OF A TABLET BY MOUTH DAILY AT 6:00PM    fludrocortisone (FLORINEF) 0.1 MG tablet Take 1 tablet (0.1 mg total) by mouth daily.   metoprolol tartrate (LOPRESSOR) 25 MG tablet TAKE 1/2 TABLET(12.5 MG) BY MOUTH TWICE DAILY   midodrine (PROAMATINE) 10 MG tablet TAKE 1 TABLET(10 MG) BY MOUTH FOUR TIMES DAILY   nitroGLYCERIN (NITROSTAT) 0.4 MG SL tablet Place 1 tablet (0.4 mg total) under the tongue every 5 (five) minutes as needed for chest pain.   omeprazole (PRILOSEC) 20 MG capsule Take 20 mg by mouth every morning.   ondansetron (ZOFRAN) 24 MG tablet Take 24 mg by mouth every 6 (six) hours as needed for nausea.   oxyCODONE (OXY IR/ROXICODONE) 5 MG immediate release tablet Take 5 mg by mouth every 8 (eight) hours as needed for moderate pain or severe pain.   Polyvinyl Alcohol-Povidone (REFRESH OP) Place 1 drop into both eyes daily as needed (DRY EYES).   PULMICORT  FLEXHALER 180 MCG/ACT inhaler Inhale 2 puffs into the lungs daily.    pyridostigmine (MESTINON) 60 MG tablet 1/2 to 1 pill po tid   [DISCONTINUED] ELIQUIS 5 MG TABS tablet TAKE 1 TABLET(5 MG) BY MOUTH TWICE DAILY     Allergies:   Amiodarone, Diltiazem, Statins, and Sulfa antibiotics   Social History   Socioeconomic History   Marital status: Married    Spouse name: Not on file   Number of children: Not on file   Years of education: Not on file   Highest education level: Not on file  Occupational History   Not on file  Tobacco Use   Smoking status: Never   Smokeless tobacco: Never  Vaping Use   Vaping Use: Never used  Substance and Sexual Activity   Alcohol use: Yes    Comment: beer w/ pizza rarely   Drug use: No   Sexual activity: Not on file  Other Topics Concern   Not on file  Social History Narrative    He is now remarried for the last 2 years, his first wife of 60 years is deceased. He has 1child from that first marriage and 2 grandchildren. He does not smoke, does not drink. He is not very active but he does kind of meander around with his new wife who is  permanently disabled.    Right handed   Caffeine use: coffee/soda daily   Social Determinants of Health   Financial Resource Strain: Not on file  Food Insecurity: Not on file  Transportation Needs: Not on file  Physical Activity: Not on file  Stress: Not on file  Social Connections: Not on file     Family History: The patient's family history includes Colon cancer in his father; Hypertension in his son. ROS:   Please see the history of present illness.    All other systems reviewed and are negative.  EKGs/Labs/Other Studies Reviewed:    The following studies were reviewed today:    Recent Labs: 04/16/2020: ALT 9 09/16/2020: BUN 22; Creatinine, Ser 1.18; Hemoglobin 10.3; Platelets 343; Potassium 4.7; Sodium 141  Recent Lipid Panel    Component Value Date/Time   CHOL 168 04/16/2020 0942   TRIG 147 04/16/2020 0942   HDL 46 04/16/2020 0942   CHOLHDL 3.7 04/16/2020 0942   CHOLHDL 3.7 03/17/2017 0006   VLDL 21 03/17/2017 0006   LDLCALC 96 04/16/2020 0942    Physical Exam:    VS:  BP 114/68 (BP Location: Left Arm, Patient Position: Sitting, Cuff Size: Normal)   Pulse 82   Ht 6' (1.829 m)   SpO2 97%   BMI 25.63 kg/m     Wt Readings from Last 3 Encounters:  12/29/20 189 lb (85.7 kg)  10/07/20 204 lb 3.2 oz (92.6 kg)  09/16/20 201 lb (91.2 kg)     GEN: He looks very frail well nourished, well developed in no acute distress HEENT: Normal NECK: No JVD; No carotid bruits LYMPHATICS: No lymphadenopathy CARDIAC: Irregular rhythm variable first heart sound no murmurs, rubs, gallops RESPIRATORY:  Clear to auscultation without rales, wheezing or rhonchi  ABDOMEN: Soft, non-tender, non-distended MUSCULOSKELETAL:  No edema; No deformity  SKIN: Warm and dry NEUROLOGIC:  Alert and oriented x 3 PSYCHIATRIC:  Normal affect    Signed, Norman Herrlich, MD  01/14/2021 10:46 AM    [facility]

## 2021-01-14 ENCOUNTER — Ambulatory Visit (INDEPENDENT_AMBULATORY_CARE_PROVIDER_SITE_OTHER): Payer: Medicare Other | Admitting: Cardiology

## 2021-01-14 ENCOUNTER — Encounter: Payer: Self-pay | Admitting: Cardiology

## 2021-01-14 ENCOUNTER — Other Ambulatory Visit: Payer: Self-pay

## 2021-01-14 VITALS — BP 114/68 | HR 82 | Ht 72.0 in

## 2021-01-14 DIAGNOSIS — Z7901 Long term (current) use of anticoagulants: Secondary | ICD-10-CM

## 2021-01-14 DIAGNOSIS — E785 Hyperlipidemia, unspecified: Secondary | ICD-10-CM | POA: Diagnosis not present

## 2021-01-14 DIAGNOSIS — I2510 Atherosclerotic heart disease of native coronary artery without angina pectoris: Secondary | ICD-10-CM | POA: Diagnosis not present

## 2021-01-14 DIAGNOSIS — I480 Paroxysmal atrial fibrillation: Secondary | ICD-10-CM | POA: Diagnosis not present

## 2021-01-14 DIAGNOSIS — S7292XD Unspecified fracture of left femur, subsequent encounter for closed fracture with routine healing: Secondary | ICD-10-CM | POA: Diagnosis not present

## 2021-01-14 DIAGNOSIS — I4891 Unspecified atrial fibrillation: Secondary | ICD-10-CM | POA: Diagnosis not present

## 2021-01-14 DIAGNOSIS — I25810 Atherosclerosis of coronary artery bypass graft(s) without angina pectoris: Secondary | ICD-10-CM | POA: Diagnosis not present

## 2021-01-14 DIAGNOSIS — I951 Orthostatic hypotension: Secondary | ICD-10-CM

## 2021-01-14 DIAGNOSIS — K5909 Other constipation: Secondary | ICD-10-CM | POA: Diagnosis not present

## 2021-01-14 DIAGNOSIS — K219 Gastro-esophageal reflux disease without esophagitis: Secondary | ICD-10-CM | POA: Diagnosis not present

## 2021-01-14 DIAGNOSIS — H9190 Unspecified hearing loss, unspecified ear: Secondary | ICD-10-CM | POA: Diagnosis not present

## 2021-01-14 MED ORDER — APIXABAN 2.5 MG PO TABS: 2.5000 mg | ORAL_TABLET | Freq: Two times a day (BID) | ORAL | 3 refills | Status: DC

## 2021-01-14 NOTE — Patient Instructions (Signed)
Medication Instructions:  Your physician has recommended you make the following change in your medication:  DECREASE: Eliquis 2.5 mg take one tablet by mouth twice daily.  *If you need a refill on your cardiac medications before your next appointment, please call your pharmacy*   Lab Work: None If you have labs (blood work) drawn today and your tests are completely normal, you will receive your results only by: MyChart Message (if you have MyChart) OR A paper copy in the mail If you have any lab test that is abnormal or we need to change your treatment, we will call you to review the results.   Testing/Procedures: None   Follow-Up: At [facility], you and your health needs are our priority.  As part of our continuing mission to provide you with exceptional heart care, we have created designated Provider Care Teams.  These Care Teams include your primary Cardiologist (physician) and Advanced Practice Providers (APPs -  Physician Assistants and Nurse Practitioners) who all work together to provide you with the care you need, when you need it.  We recommend signing up for the patient portal called "MyChart".  Sign up information is provided on this After Visit Summary.  MyChart is used to connect with patients for Virtual Visits (Telemedicine).  Patients are able to view lab/test results, encounter notes, upcoming appointments, etc.  Non-urgent messages can be sent to your provider as well.   To learn more about what you can do with MyChart, go to [email or web address].    Your next appointment:   6 month(s)  The format for your next appointment:   In Person  Provider:   Norman Herrlich, MD   Other Instructions

## 2021-01-15 DIAGNOSIS — K219 Gastro-esophageal reflux disease without esophagitis: Secondary | ICD-10-CM | POA: Diagnosis not present

## 2021-01-15 DIAGNOSIS — S7292XD Unspecified fracture of left femur, subsequent encounter for closed fracture with routine healing: Secondary | ICD-10-CM | POA: Diagnosis not present

## 2021-01-15 DIAGNOSIS — I4891 Unspecified atrial fibrillation: Secondary | ICD-10-CM | POA: Diagnosis not present

## 2021-01-15 DIAGNOSIS — I2510 Atherosclerotic heart disease of native coronary artery without angina pectoris: Secondary | ICD-10-CM | POA: Diagnosis not present

## 2021-01-15 DIAGNOSIS — K5909 Other constipation: Secondary | ICD-10-CM | POA: Diagnosis not present

## 2021-01-15 DIAGNOSIS — H9190 Unspecified hearing loss, unspecified ear: Secondary | ICD-10-CM | POA: Diagnosis not present

## 2021-01-19 DIAGNOSIS — H9190 Unspecified hearing loss, unspecified ear: Secondary | ICD-10-CM | POA: Diagnosis not present

## 2021-01-19 DIAGNOSIS — I4891 Unspecified atrial fibrillation: Secondary | ICD-10-CM | POA: Diagnosis not present

## 2021-01-19 DIAGNOSIS — K5909 Other constipation: Secondary | ICD-10-CM | POA: Diagnosis not present

## 2021-01-19 DIAGNOSIS — K219 Gastro-esophageal reflux disease without esophagitis: Secondary | ICD-10-CM | POA: Diagnosis not present

## 2021-01-19 DIAGNOSIS — S7292XD Unspecified fracture of left femur, subsequent encounter for closed fracture with routine healing: Secondary | ICD-10-CM | POA: Diagnosis not present

## 2021-01-19 DIAGNOSIS — I2510 Atherosclerotic heart disease of native coronary artery without angina pectoris: Secondary | ICD-10-CM | POA: Diagnosis not present

## 2021-01-21 ENCOUNTER — Telehealth: Payer: Self-pay | Admitting: Neurology

## 2021-01-21 NOTE — Telephone Encounter (Signed)
Called pt wife back. He has been stable until the last few days. Having uncontrollable movements in hands/arms and mouth. Denies missing any medication doses. Had not taken pyridostigmine because he was told only to take if he had spells. Therefore, he took first dose a couple days ago. Then he took 1/2 tablet the last two mornings. Unsure if this is d/t pyridostigmine or other. Episodes usually last an hour. Currently, sx are resolved. Advised I will speak with MD and call back. Med list up to date, no new meds since last seen.

## 2021-01-21 NOTE — Telephone Encounter (Signed)
Pt's wife(on DPR) is asking for a call to discuss uncontrollable mouth movement, please call

## 2021-01-21 NOTE — Telephone Encounter (Signed)
Called and spoke w/ wife. Advised Dr. Epimenio Foot would like her to hold husbands pyridostigmine to see if this is cause. If sx continue after stopping this, he would like for him to come in to see him. She verbalized understanding.

## 2021-01-22 ENCOUNTER — Telehealth: Payer: Self-pay | Admitting: Cardiology

## 2021-01-22 DIAGNOSIS — I4891 Unspecified atrial fibrillation: Secondary | ICD-10-CM | POA: Diagnosis not present

## 2021-01-22 DIAGNOSIS — I2510 Atherosclerotic heart disease of native coronary artery without angina pectoris: Secondary | ICD-10-CM | POA: Diagnosis not present

## 2021-01-22 DIAGNOSIS — K219 Gastro-esophageal reflux disease without esophagitis: Secondary | ICD-10-CM | POA: Diagnosis not present

## 2021-01-22 DIAGNOSIS — S7292XD Unspecified fracture of left femur, subsequent encounter for closed fracture with routine healing: Secondary | ICD-10-CM | POA: Diagnosis not present

## 2021-01-22 DIAGNOSIS — H9190 Unspecified hearing loss, unspecified ear: Secondary | ICD-10-CM | POA: Diagnosis not present

## 2021-01-22 DIAGNOSIS — K5909 Other constipation: Secondary | ICD-10-CM | POA: Diagnosis not present

## 2021-01-22 NOTE — Telephone Encounter (Signed)
Spoke to the patients wife just now and let her know that she could cut these tablets in half. She verbalizes understanding.

## 2021-01-22 NOTE — Telephone Encounter (Signed)
    Pt c/o medication issue:  1. Name of Medication: apixaban (ELIQUIS) 2.5 MG TABS tablet  2. How are you currently taking this medication (dosage and times per day)? Take 1 tablet (2.5 mg total) by mouth 2 (two) times daily.  3. Are you having a reaction (difficulty breathing--STAT)?   4. What is your medication issue? Pt'a wife said, Dr. Dulce Sellar recently changed pt's eliquis from 5 mg to 2.5 mg. She said they have over 300 pills of 5 mg and would like to ask if she can just cut those pill in half

## 2021-02-01 DIAGNOSIS — J984 Other disorders of lung: Secondary | ICD-10-CM | POA: Diagnosis not present

## 2021-02-01 DIAGNOSIS — Z23 Encounter for immunization: Secondary | ICD-10-CM | POA: Diagnosis not present

## 2021-02-01 DIAGNOSIS — W19XXXA Unspecified fall, initial encounter: Secondary | ICD-10-CM | POA: Diagnosis not present

## 2021-02-01 DIAGNOSIS — R410 Disorientation, unspecified: Secondary | ICD-10-CM | POA: Diagnosis not present

## 2021-02-01 DIAGNOSIS — R5381 Other malaise: Secondary | ICD-10-CM | POA: Diagnosis not present

## 2021-02-01 DIAGNOSIS — S199XXA Unspecified injury of neck, initial encounter: Secondary | ICD-10-CM | POA: Diagnosis not present

## 2021-02-01 DIAGNOSIS — Z043 Encounter for examination and observation following other accident: Secondary | ICD-10-CM | POA: Diagnosis not present

## 2021-02-01 DIAGNOSIS — I6529 Occlusion and stenosis of unspecified carotid artery: Secondary | ICD-10-CM | POA: Diagnosis not present

## 2021-02-01 DIAGNOSIS — M47816 Spondylosis without myelopathy or radiculopathy, lumbar region: Secondary | ICD-10-CM | POA: Diagnosis not present

## 2021-02-01 DIAGNOSIS — I2510 Atherosclerotic heart disease of native coronary artery without angina pectoris: Secondary | ICD-10-CM | POA: Diagnosis not present

## 2021-02-01 DIAGNOSIS — F4489 Other dissociative and conversion disorders: Secondary | ICD-10-CM | POA: Diagnosis not present

## 2021-02-01 DIAGNOSIS — S0181XA Laceration without foreign body of other part of head, initial encounter: Secondary | ICD-10-CM | POA: Diagnosis not present

## 2021-02-01 DIAGNOSIS — S0101XA Laceration without foreign body of scalp, initial encounter: Secondary | ICD-10-CM | POA: Diagnosis not present

## 2021-02-01 DIAGNOSIS — Z96642 Presence of left artificial hip joint: Secondary | ICD-10-CM | POA: Diagnosis not present

## 2021-02-01 DIAGNOSIS — S0003XA Contusion of scalp, initial encounter: Secondary | ICD-10-CM | POA: Diagnosis not present

## 2021-02-08 DIAGNOSIS — R296 Repeated falls: Secondary | ICD-10-CM | POA: Diagnosis not present

## 2021-02-08 DIAGNOSIS — R2681 Unsteadiness on feet: Secondary | ICD-10-CM | POA: Diagnosis not present

## 2021-02-08 DIAGNOSIS — S0101XD Laceration without foreign body of scalp, subsequent encounter: Secondary | ICD-10-CM | POA: Diagnosis not present

## 2021-02-08 DIAGNOSIS — M6281 Muscle weakness (generalized): Secondary | ICD-10-CM | POA: Diagnosis not present

## 2021-03-03 DIAGNOSIS — M25511 Pain in right shoulder: Secondary | ICD-10-CM | POA: Diagnosis not present

## 2021-03-25 DIAGNOSIS — E785 Hyperlipidemia, unspecified: Secondary | ICD-10-CM | POA: Diagnosis present

## 2021-03-25 DIAGNOSIS — R410 Disorientation, unspecified: Secondary | ICD-10-CM | POA: Diagnosis not present

## 2021-03-25 DIAGNOSIS — K219 Gastro-esophageal reflux disease without esophagitis: Secondary | ICD-10-CM | POA: Diagnosis present

## 2021-03-25 DIAGNOSIS — Z79899 Other long term (current) drug therapy: Secondary | ICD-10-CM | POA: Diagnosis not present

## 2021-03-25 DIAGNOSIS — R29700 NIHSS score 0: Secondary | ICD-10-CM | POA: Diagnosis present

## 2021-03-25 DIAGNOSIS — R41841 Cognitive communication deficit: Secondary | ICD-10-CM | POA: Diagnosis not present

## 2021-03-25 DIAGNOSIS — Z79891 Long term (current) use of opiate analgesic: Secondary | ICD-10-CM | POA: Diagnosis not present

## 2021-03-25 DIAGNOSIS — I10 Essential (primary) hypertension: Secondary | ICD-10-CM | POA: Diagnosis present

## 2021-03-25 DIAGNOSIS — G4700 Insomnia, unspecified: Secondary | ICD-10-CM | POA: Diagnosis not present

## 2021-03-25 DIAGNOSIS — Z951 Presence of aortocoronary bypass graft: Secondary | ICD-10-CM | POA: Diagnosis not present

## 2021-03-25 DIAGNOSIS — J341 Cyst and mucocele of nose and nasal sinus: Secondary | ICD-10-CM | POA: Diagnosis not present

## 2021-03-25 DIAGNOSIS — R2689 Other abnormalities of gait and mobility: Secondary | ICD-10-CM | POA: Diagnosis not present

## 2021-03-25 DIAGNOSIS — G319 Degenerative disease of nervous system, unspecified: Secondary | ICD-10-CM | POA: Diagnosis not present

## 2021-03-25 DIAGNOSIS — D519 Vitamin B12 deficiency anemia, unspecified: Secondary | ICD-10-CM | POA: Diagnosis not present

## 2021-03-25 DIAGNOSIS — Z7901 Long term (current) use of anticoagulants: Secondary | ICD-10-CM | POA: Diagnosis not present

## 2021-03-25 DIAGNOSIS — R443 Hallucinations, unspecified: Secondary | ICD-10-CM | POA: Diagnosis not present

## 2021-03-25 DIAGNOSIS — Z66 Do not resuscitate: Secondary | ICD-10-CM | POA: Diagnosis present

## 2021-03-25 DIAGNOSIS — R2681 Unsteadiness on feet: Secondary | ICD-10-CM | POA: Diagnosis not present

## 2021-03-25 DIAGNOSIS — I2510 Atherosclerotic heart disease of native coronary artery without angina pectoris: Secondary | ICD-10-CM | POA: Diagnosis not present

## 2021-03-25 DIAGNOSIS — I4891 Unspecified atrial fibrillation: Secondary | ICD-10-CM | POA: Diagnosis not present

## 2021-03-25 DIAGNOSIS — R531 Weakness: Secondary | ICD-10-CM | POA: Diagnosis not present

## 2021-03-25 DIAGNOSIS — R200 Anesthesia of skin: Secondary | ICD-10-CM | POA: Diagnosis not present

## 2021-03-25 DIAGNOSIS — M6281 Muscle weakness (generalized): Secondary | ICD-10-CM | POA: Diagnosis not present

## 2021-03-25 DIAGNOSIS — Z743 Need for continuous supervision: Secondary | ICD-10-CM | POA: Diagnosis not present

## 2021-03-25 DIAGNOSIS — R296 Repeated falls: Secondary | ICD-10-CM | POA: Diagnosis present

## 2021-03-25 DIAGNOSIS — G459 Transient cerebral ischemic attack, unspecified: Secondary | ICD-10-CM | POA: Diagnosis not present

## 2021-03-25 DIAGNOSIS — I4819 Other persistent atrial fibrillation: Secondary | ICD-10-CM | POA: Diagnosis not present

## 2021-03-25 DIAGNOSIS — F0390 Unspecified dementia without behavioral disturbance: Secondary | ICD-10-CM | POA: Diagnosis not present

## 2021-03-25 DIAGNOSIS — Z8673 Personal history of transient ischemic attack (TIA), and cerebral infarction without residual deficits: Secondary | ICD-10-CM | POA: Diagnosis not present

## 2021-03-25 DIAGNOSIS — R202 Paresthesia of skin: Secondary | ICD-10-CM | POA: Diagnosis not present

## 2021-03-25 DIAGNOSIS — F0391 Unspecified dementia with behavioral disturbance: Secondary | ICD-10-CM | POA: Diagnosis not present

## 2021-03-25 DIAGNOSIS — I672 Cerebral atherosclerosis: Secondary | ICD-10-CM | POA: Diagnosis not present

## 2021-03-25 DIAGNOSIS — R471 Dysarthria and anarthria: Secondary | ICD-10-CM | POA: Diagnosis not present

## 2021-03-25 DIAGNOSIS — I252 Old myocardial infarction: Secondary | ICD-10-CM | POA: Diagnosis not present

## 2021-03-25 DIAGNOSIS — M1990 Unspecified osteoarthritis, unspecified site: Secondary | ICD-10-CM | POA: Diagnosis present

## 2021-03-25 DIAGNOSIS — N183 Chronic kidney disease, stage 3 unspecified: Secondary | ICD-10-CM | POA: Diagnosis not present

## 2021-03-25 DIAGNOSIS — R4781 Slurred speech: Secondary | ICD-10-CM | POA: Diagnosis not present

## 2021-03-25 DIAGNOSIS — R0902 Hypoxemia: Secondary | ICD-10-CM | POA: Diagnosis not present

## 2021-03-25 DIAGNOSIS — R5381 Other malaise: Secondary | ICD-10-CM | POA: Diagnosis present

## 2021-03-25 DIAGNOSIS — Z9181 History of falling: Secondary | ICD-10-CM | POA: Diagnosis not present

## 2021-03-25 DIAGNOSIS — I480 Paroxysmal atrial fibrillation: Secondary | ICD-10-CM | POA: Diagnosis present

## 2021-03-29 DIAGNOSIS — R2681 Unsteadiness on feet: Secondary | ICD-10-CM | POA: Diagnosis not present

## 2021-03-29 DIAGNOSIS — D519 Vitamin B12 deficiency anemia, unspecified: Secondary | ICD-10-CM | POA: Diagnosis not present

## 2021-03-29 DIAGNOSIS — K219 Gastro-esophageal reflux disease without esophagitis: Secondary | ICD-10-CM | POA: Diagnosis not present

## 2021-03-29 DIAGNOSIS — F0391 Unspecified dementia with behavioral disturbance: Secondary | ICD-10-CM | POA: Diagnosis not present

## 2021-03-29 DIAGNOSIS — I4819 Other persistent atrial fibrillation: Secondary | ICD-10-CM | POA: Diagnosis not present

## 2021-03-29 DIAGNOSIS — R2689 Other abnormalities of gait and mobility: Secondary | ICD-10-CM | POA: Diagnosis not present

## 2021-03-29 DIAGNOSIS — N183 Chronic kidney disease, stage 3 unspecified: Secondary | ICD-10-CM | POA: Diagnosis not present

## 2021-03-29 DIAGNOSIS — G4700 Insomnia, unspecified: Secondary | ICD-10-CM | POA: Diagnosis not present

## 2021-03-29 DIAGNOSIS — I4891 Unspecified atrial fibrillation: Secondary | ICD-10-CM | POA: Diagnosis not present

## 2021-03-29 DIAGNOSIS — R471 Dysarthria and anarthria: Secondary | ICD-10-CM | POA: Diagnosis not present

## 2021-03-29 DIAGNOSIS — R443 Hallucinations, unspecified: Secondary | ICD-10-CM | POA: Diagnosis not present

## 2021-03-29 DIAGNOSIS — R531 Weakness: Secondary | ICD-10-CM | POA: Diagnosis not present

## 2021-03-29 DIAGNOSIS — F0390 Unspecified dementia without behavioral disturbance: Secondary | ICD-10-CM | POA: Diagnosis not present

## 2021-03-29 DIAGNOSIS — G459 Transient cerebral ischemic attack, unspecified: Secondary | ICD-10-CM | POA: Diagnosis not present

## 2021-03-29 DIAGNOSIS — I10 Essential (primary) hypertension: Secondary | ICD-10-CM | POA: Diagnosis not present

## 2021-03-29 DIAGNOSIS — R41841 Cognitive communication deficit: Secondary | ICD-10-CM | POA: Diagnosis not present

## 2021-03-29 DIAGNOSIS — I2510 Atherosclerotic heart disease of native coronary artery without angina pectoris: Secondary | ICD-10-CM | POA: Diagnosis not present

## 2021-03-29 DIAGNOSIS — M6281 Muscle weakness (generalized): Secondary | ICD-10-CM | POA: Diagnosis not present

## 2021-03-29 DIAGNOSIS — Z743 Need for continuous supervision: Secondary | ICD-10-CM | POA: Diagnosis not present

## 2021-03-29 DIAGNOSIS — R262 Difficulty in walking, not elsewhere classified: Secondary | ICD-10-CM | POA: Diagnosis not present

## 2021-04-02 DIAGNOSIS — R262 Difficulty in walking, not elsewhere classified: Secondary | ICD-10-CM | POA: Diagnosis not present

## 2021-04-02 DIAGNOSIS — F0390 Unspecified dementia without behavioral disturbance: Secondary | ICD-10-CM | POA: Diagnosis not present

## 2021-04-02 DIAGNOSIS — I2510 Atherosclerotic heart disease of native coronary artery without angina pectoris: Secondary | ICD-10-CM | POA: Diagnosis not present

## 2021-04-02 DIAGNOSIS — I4891 Unspecified atrial fibrillation: Secondary | ICD-10-CM | POA: Diagnosis not present

## 2021-04-22 DIAGNOSIS — I4819 Other persistent atrial fibrillation: Secondary | ICD-10-CM | POA: Diagnosis not present

## 2021-04-22 DIAGNOSIS — D631 Anemia in chronic kidney disease: Secondary | ICD-10-CM | POA: Diagnosis not present

## 2021-04-22 DIAGNOSIS — M80052D Age-related osteoporosis with current pathological fracture, left femur, subsequent encounter for fracture with routine healing: Secondary | ICD-10-CM | POA: Diagnosis not present

## 2021-04-22 DIAGNOSIS — H9190 Unspecified hearing loss, unspecified ear: Secondary | ICD-10-CM | POA: Diagnosis not present

## 2021-04-22 DIAGNOSIS — K5909 Other constipation: Secondary | ICD-10-CM | POA: Diagnosis not present

## 2021-04-22 DIAGNOSIS — E785 Hyperlipidemia, unspecified: Secondary | ICD-10-CM | POA: Diagnosis not present

## 2021-04-22 DIAGNOSIS — I4891 Unspecified atrial fibrillation: Secondary | ICD-10-CM | POA: Diagnosis not present

## 2021-04-22 DIAGNOSIS — I129 Hypertensive chronic kidney disease with stage 1 through stage 4 chronic kidney disease, or unspecified chronic kidney disease: Secondary | ICD-10-CM | POA: Diagnosis not present

## 2021-04-22 DIAGNOSIS — F411 Generalized anxiety disorder: Secondary | ICD-10-CM | POA: Diagnosis not present

## 2021-04-22 DIAGNOSIS — I2510 Atherosclerotic heart disease of native coronary artery without angina pectoris: Secondary | ICD-10-CM | POA: Diagnosis not present

## 2021-04-22 DIAGNOSIS — Z8673 Personal history of transient ischemic attack (TIA), and cerebral infarction without residual deficits: Secondary | ICD-10-CM | POA: Diagnosis not present

## 2021-04-22 DIAGNOSIS — N183 Chronic kidney disease, stage 3 unspecified: Secondary | ICD-10-CM | POA: Diagnosis not present

## 2021-04-22 DIAGNOSIS — K219 Gastro-esophageal reflux disease without esophagitis: Secondary | ICD-10-CM | POA: Diagnosis not present

## 2021-04-22 DIAGNOSIS — F0394 Unspecified dementia, unspecified severity, with anxiety: Secondary | ICD-10-CM | POA: Diagnosis not present

## 2021-04-22 DIAGNOSIS — G8929 Other chronic pain: Secondary | ICD-10-CM | POA: Diagnosis not present

## 2021-04-22 DIAGNOSIS — Z951 Presence of aortocoronary bypass graft: Secondary | ICD-10-CM | POA: Diagnosis not present

## 2021-04-22 DIAGNOSIS — Z7901 Long term (current) use of anticoagulants: Secondary | ICD-10-CM | POA: Diagnosis not present

## 2021-04-23 DIAGNOSIS — G8929 Other chronic pain: Secondary | ICD-10-CM | POA: Diagnosis not present

## 2021-04-23 DIAGNOSIS — F411 Generalized anxiety disorder: Secondary | ICD-10-CM | POA: Diagnosis not present

## 2021-04-23 DIAGNOSIS — H9190 Unspecified hearing loss, unspecified ear: Secondary | ICD-10-CM | POA: Diagnosis not present

## 2021-04-23 DIAGNOSIS — K5909 Other constipation: Secondary | ICD-10-CM | POA: Diagnosis not present

## 2021-04-23 DIAGNOSIS — I2510 Atherosclerotic heart disease of native coronary artery without angina pectoris: Secondary | ICD-10-CM | POA: Diagnosis not present

## 2021-04-23 DIAGNOSIS — I4891 Unspecified atrial fibrillation: Secondary | ICD-10-CM | POA: Diagnosis not present

## 2021-04-27 DIAGNOSIS — K5909 Other constipation: Secondary | ICD-10-CM | POA: Diagnosis not present

## 2021-04-27 DIAGNOSIS — F411 Generalized anxiety disorder: Secondary | ICD-10-CM | POA: Diagnosis not present

## 2021-04-27 DIAGNOSIS — G8929 Other chronic pain: Secondary | ICD-10-CM | POA: Diagnosis not present

## 2021-04-27 DIAGNOSIS — I4891 Unspecified atrial fibrillation: Secondary | ICD-10-CM | POA: Diagnosis not present

## 2021-04-27 DIAGNOSIS — H9190 Unspecified hearing loss, unspecified ear: Secondary | ICD-10-CM | POA: Diagnosis not present

## 2021-04-27 DIAGNOSIS — I2510 Atherosclerotic heart disease of native coronary artery without angina pectoris: Secondary | ICD-10-CM | POA: Diagnosis not present

## 2021-04-29 DIAGNOSIS — I2510 Atherosclerotic heart disease of native coronary artery without angina pectoris: Secondary | ICD-10-CM | POA: Diagnosis not present

## 2021-04-29 DIAGNOSIS — K5909 Other constipation: Secondary | ICD-10-CM | POA: Diagnosis not present

## 2021-04-29 DIAGNOSIS — I4891 Unspecified atrial fibrillation: Secondary | ICD-10-CM | POA: Diagnosis not present

## 2021-04-29 DIAGNOSIS — F411 Generalized anxiety disorder: Secondary | ICD-10-CM | POA: Diagnosis not present

## 2021-04-29 DIAGNOSIS — G8929 Other chronic pain: Secondary | ICD-10-CM | POA: Diagnosis not present

## 2021-04-29 DIAGNOSIS — H9190 Unspecified hearing loss, unspecified ear: Secondary | ICD-10-CM | POA: Diagnosis not present

## 2021-05-03 DIAGNOSIS — K5909 Other constipation: Secondary | ICD-10-CM | POA: Diagnosis not present

## 2021-05-03 DIAGNOSIS — G8929 Other chronic pain: Secondary | ICD-10-CM | POA: Diagnosis not present

## 2021-05-03 DIAGNOSIS — H9190 Unspecified hearing loss, unspecified ear: Secondary | ICD-10-CM | POA: Diagnosis not present

## 2021-05-03 DIAGNOSIS — I4891 Unspecified atrial fibrillation: Secondary | ICD-10-CM | POA: Diagnosis not present

## 2021-05-03 DIAGNOSIS — F411 Generalized anxiety disorder: Secondary | ICD-10-CM | POA: Diagnosis not present

## 2021-05-03 DIAGNOSIS — I2510 Atherosclerotic heart disease of native coronary artery without angina pectoris: Secondary | ICD-10-CM | POA: Diagnosis not present

## 2021-05-04 ENCOUNTER — Ambulatory Visit (INDEPENDENT_AMBULATORY_CARE_PROVIDER_SITE_OTHER): Payer: Medicare Other | Admitting: Neurology

## 2021-05-04 ENCOUNTER — Encounter: Payer: Self-pay | Admitting: Neurology

## 2021-05-04 VITALS — BP 115/63 | HR 62 | Ht 74.0 in

## 2021-05-04 DIAGNOSIS — R200 Anesthesia of skin: Secondary | ICD-10-CM | POA: Diagnosis not present

## 2021-05-04 DIAGNOSIS — R2681 Unsteadiness on feet: Secondary | ICD-10-CM | POA: Diagnosis not present

## 2021-05-04 DIAGNOSIS — I480 Paroxysmal atrial fibrillation: Secondary | ICD-10-CM | POA: Diagnosis not present

## 2021-05-04 DIAGNOSIS — I25810 Atherosclerosis of coronary artery bypass graft(s) without angina pectoris: Secondary | ICD-10-CM | POA: Diagnosis not present

## 2021-05-04 DIAGNOSIS — I951 Orthostatic hypotension: Secondary | ICD-10-CM

## 2021-05-04 MED ORDER — FLUDROCORTISONE ACETATE 0.1 MG PO TABS: 0.2000 mg | ORAL_TABLET | Freq: Every day | ORAL | 3 refills | Status: DC

## 2021-05-04 NOTE — Progress Notes (Signed)
[facility]  [name] DOB: 09-02-1922  REFERRING DOCTOR OR PCP:   Dr. Casper Harrison (PCP); Dr, Dulce Sellar (cardiology) SOURCE:    _________________________________   HISTORICAL  CHIEF COMPLAINT:  Chief Complaint  Patient presents with   Follow-up    Pt with son, rm 1 6 wks ago was admitted to [facility] for a episode where arm became numb, drools. Appears like stroke but MRI c/o showed no stroke. He was dc to [facility] for rehab and recently dc home. Very weak and hard to stand/move. He had another "episode" yesterday.    HISTORY OF PRESENT ILLNESS:  Joshua Chambers is a 87 y.o. man with episodes of arm numbness  Update 05/04/2021 He had several more episodes of right arm numbness and drooling.   Due to the one episode lasting much longer than typical he was taken to [place] and a stroke was ruled out.  The episode was after a meal.    His family has tried to provide snacks.   Most spells occur after lunch. Or after breakfast   He is doing better taking the proamatine first thing in the morning.     He is on proamatine 10 mg.   Also takes fudrocortisone 0.1 mg.   I   Joshua Chambers is a 87 year old man who has had frequent episodes of numbness in the left arm..   The first spell occurred about 3 years ago while he was moving homes and he was helping some.   This episode lasted one day and then resolved.   He has had multiple spells, probably now occurring a couple times a week for 1 to 2 hours.  Most spells occur in the morning after breakfast and  he takes his am medications.  He is always sitting during the more recent spells.   He notes numbness but no weakness.  There were concerns that the episodes were due to strokes or TIAs.  He is very claustophobic and has had only one good MRI in 2018.   It was actually fairly normal for age.  He has had a couple CT scans.   Scans are normal for age.  There were no acute changes.    He has never had GTC but seizures were  also tossed out once as a possible explanation.      Spells always occur in the morning.  He gets out of bed at 7:15 a.m., he usually has some cereal while his wife is getting up.  He takes his medications around 8:00 a.m, concurrently with having breakfast with his wife and spells are usually 8:30 am.  For breakfast, he usually has cereal, pastry and orange juice.   Most episodes only involve numbness but one episode also was associated with some slurred speech and slobbering some.    Episodes are usually resolved by 10 AM and he has no further numbness.  He is on proamatine and fludrocortisone for orthostatic hypotension.    BP is always lower in the mornings than later on in the day.    Typical readings in the morning might be 80/50.      MRI of the head 03/16/2017 showed mild generalized cortical atrophy and some T2/flair hyperintense foci predominantly in  the periatrial deep white matter consistent with chronic microvascular ischemic change.  No acute findings.  MRA of the head 03/16/2017 was essentially normal.  CT scan of the head 11/09/2020 showed mild atrophy, periatrial white matter changes and and no acute findings  He has CAD and is s/p CABG.   He is on anticoag for A-Fib.   He sees Dr, Dulce Sellar  He recently fell and broke his left hip requiring surgery.   He went to a SNF but is still only able to walk with strong support.    REVIEW OF SYSTEMS: Constitutional: No fevers, chills, sweats, or change in appetite Eyes: No visual changes, double vision, eye pain Ear, nose and throat: No hearing loss, ear pain, nasal congestion, sore throat Cardiovascular: No chest pain.  He has atrial fibrillation. Respiratory:  No shortness of breath at rest or with exertion.   No wheezes GastrointestinaI: No nausea, vomiting, diarrhea, abdominal pain, fecal incontinence Genitourinary:  No dysuria, urinary retention or frequency.  No nocturia. Musculoskeletal:  No neck pain, back pain.  He has left hip  pain after his fracture. Integumentary: No rash, pruritus, skin lesions Neurological: as above Psychiatric: No depression at this time.  No anxiety Endocrine: No palpitations, diaphoresis, change in appetite, change in weigh or increased thirst Hematologic/Lymphatic:  No anemia, purpura, petechiae. Allergic/Immunologic: No itchy/runny eyes, nasal congestion, recent allergic reactions, rashes  ALLERGIES: Allergies  Allergen Reactions   Amiodarone Other (See Comments)    Extreme dehydration   Diltiazem Other (See Comments)    Pt unsure of reaction   Statins    Sulfa Antibiotics Other (See Comments)    Pt unsure of reaction    HOME MEDICATIONS:  Current Outpatient Medications:    acetaminophen (TYLENOL) 325 MG tablet, Take 325 mg by mouth every 8 (eight) hours as needed for mild pain., Disp: , Rfl:    apixaban (ELIQUIS) 2.5 MG TABS tablet, Take 1 tablet (2.5 mg total) by mouth 2 (two) times daily., Disp: 180 tablet, Rfl: 3   atorvastatin (LIPITOR) 40 MG tablet, TAKE 1/2 OF A TABLET BY MOUTH DAILY AT 6:00PM, Disp: 90 tablet, Rfl: 1   esomeprazole (NEXIUM) 20 MG capsule, Take 20 mg by mouth daily at 12 noon., Disp: , Rfl:    metoprolol tartrate (LOPRESSOR) 25 MG tablet, TAKE 1/2 TABLET(12.5 MG) BY MOUTH TWICE DAILY, Disp: 90 tablet, Rfl: 1   midodrine (PROAMATINE) 10 MG tablet, TAKE 1 TABLET(10 MG) BY MOUTH FOUR TIMES DAILY (Patient taking differently: Take 10 mg by mouth 3 (three) times daily.), Disp: 360 tablet, Rfl: 0   nitroGLYCERIN (NITROSTAT) 0.4 MG SL tablet, Place 1 tablet (0.4 mg total) under the tongue every 5 (five) minutes as needed for chest pain., Disp: 25 tablet, Rfl: 6   oxyCODONE (OXY IR/ROXICODONE) 5 MG immediate release tablet, Take 5 mg by mouth every 8 (eight) hours as needed for moderate pain or severe pain., Disp: , Rfl:    Polyvinyl Alcohol-Povidone (REFRESH OP), Place 1 drop into both eyes daily as needed (DRY EYES)., Disp: , Rfl:    PULMICORT FLEXHALER 180 MCG/ACT  inhaler, Inhale 2 puffs into the lungs daily. , Disp: , Rfl:    fludrocortisone (FLORINEF) 0.1 MG tablet, Take 2 tablets (0.2 mg total) by mouth daily., Disp: 180 tablet, Rfl: 3   pyridostigmine (MESTINON) 60 MG tablet, 1/2 to 1 pill po tid (Patient not taking: No sig reported), Disp: 90 tablet, Rfl: 5  PAST MEDICAL HISTORY: Past Medical History:  Diagnosis Date   Arteriosclerosis of arterial coronary artery bypass graft    Arthritis    Atrial contractions, premature    Atrial fibrillation (HCC)    Benign hypertension    Bilateral carotid artery stenosis    CAD (coronary artery disease), autologous  vein bypass graft 04/19/2013   100% flush occlusion of SVG-OM & SVG-Diag; near Subtotal Occlusion of SVG-RCA (PCI of RCA done to avoid distal embolization with PCI to SVG)   CAD (coronary artery disease), native coronary artery 2007   Multivessel CAD referred for CABG   CAD S/P percutaneous coronary angioplasty  04/19/2013   PCI of Native RCA - Promus Premier DES 3.0 mm x 38 mm (3.6 mm);    Chronic anticoagulation 04/17/2018   Chronic combined systolic and diastolic heart failure (HCC)    Coronary artery disease involving coronary bypass graft of native heart without angina pectoris; CAD (coronary artery disease) of artery bypass graft:  loss of VG to RCA and VG-Diag 04/17/2013   Dyslipidemia    Dyslipidemia, goal LDL below 70    Dyspnea 04/30/2013   Essential hypertension 05/02/2013   Fatigue 04/17/2013   GERD (gastroesophageal reflux disease)    Hematuria 05/02/2013   History of DVT of lower extremity    Right popliteal vein   History of stroke August 2011   Possible TIA in 2001;  Echo: EF 45-50%, mild HK of anterior septum.   History of transient cerebral ischemia    Hx of orthostatic hypotension 2009   Positive tilt table:  dizziness/vasopressor type; previously on the decision    Hx SBO    Ischemic cardiomyopathy 02/06/2018   Nephrolithiasis    Orthostatic hypotension    Other and  unspecified coagulation defects 04/17/2013   PAF (paroxysmal atrial fibrillation) (HCC) 02/06/2018   Premature atrial contractions 07/28/2015   Preoperative examination, unspecified 04/17/2013   S/P CABG x 3 2007   LIMA-LAD, SVG-DIAG, SVG-RCA   S/P CABG x 4; LIMA-LAD, SVG-RPDA, SVG-OM, SVG-D1 -- all but LIMA-LAD occluded 03/18/2006   03/2006 - LIMA-LAD, SVG-D1, SVG-RPDA    Sinus arrhythmia 09/09/2016   Stroke [facility])    TIA (transient ischemic attack) 03/16/2017   Unsteady gait     PAST SURGICAL HISTORY: Past Surgical History:  Procedure Laterality Date   APPENDECTOMY     CARDIAC SURGERY  2000s   COLON SURGERY     CORONARY ANGIOPLASTY WITH STENT PLACEMENT  04/19/13   Stent-Promus DES (3.0 mm x 38 mm - 3.6 mm) to native RCA prox-mid,   CORONARY ARTERY BYPASS GRAFT  2007   LIMA-LAD, SVG-D1, SVG-RCA   LEFT HEART CATHETERIZATION WITH CORONARY/GRAFT ANGIOGRAM Left 04/19/2013   LEFT HEART CATH W/ CORONARY/GRAFT ANGIOGRAM;  [name], MD;  Location: [facility] CATH LAB;  LM 20%-->LAD 70-80% then subTO after D1 (Patent LIMA-LAD); Small non-dom Cx ~20% ostial.  RCA long 60-70% prox --> 90% mid.  SVG-OM & SVG-Diag 100%, diffuse Subtotal occlusion of SVG-rPD   STENT PLACEMENT VASCULAR ([facility] HX)     TONSILLECTOMY      FAMILY HISTORY: Family History  Problem Relation Age of Onset   Colon cancer Father    Hypertension Son     SOCIAL HISTORY:  Social History   Socioeconomic History   Marital status: Married    Spouse name: Not on file   Number of children: Not on file   Years of education: Not on file   Highest education level: Not on file  Occupational History   Not on file  Tobacco Use   Smoking status: Never   Smokeless tobacco: Never  Vaping Use   Vaping Use: Never used  Substance and Sexual Activity   Alcohol use: Yes    Comment: beer w/ pizza rarely   Drug use: No   Sexual  activity: Not on file  Other Topics Concern   Not on file  Social History Narrative    He is now remarried  for the last 2 years, his first wife of 60 years is deceased. He has 1child from that first marriage and 2 grandchildren. He does not smoke, does not drink. He is not very active but he does kind of meander around with his new wife who is permanently disabled.    Right handed   Caffeine use: coffee/soda daily   Social Determinants of Health   Financial Resource Strain: Not on file  Food Insecurity: Not on file  Transportation Needs: Not on file  Physical Activity: Not on file  Stress: Not on file  Social Connections: Not on file  Intimate Partner Violence: Not on file     PHYSICAL EXAM  Vitals:   05/04/21 1143  BP: 115/63  Pulse: 62  Height: 6\' 2"  (1.88 m)    Body mass index is 24.27 kg/m.   General: The patient is well-developed and well-nourished and in no acute distress.  He is in a wheelchair.  HEENT:  Head is Miller/AT.  Sclera are anicteric.    Skin: Extremities are without rash or  edema.  Neurologic Exam  Mental status: The patient is alert and oriented x 3 at the time of the examination. The patient has apparent normal recent and remote memory, with an apparently normal attention span and concentration ability.   Speech is normal.  Cranial nerves: Extraocular movements are full. Pupils are equal, round, and reactive to light and accomodation.  Visual fields are full.  Facial symmetry is present. There is good facial sensation to soft touch bilaterally.Facial strength is normal.  Trapezius and sternocleidomastoid strength is normal. No dysarthria is noted.  The tongue is midline, and the patient has symmetric elevation of the soft palate. No obvious hearing deficits are noted.  Motor:  Muscle bulk is normal.   Tone is normal. Strength is  5 / 5 in all 4 extremities.   Sensory: On sensory exam, touch and vibration was normal in the arms.    He has 50% reduction of vibration at the toes and normal vibration sensation at the ankles.  Touch sensation was more normal in the  feet Coordination: Cerebellar testing reveals good finger-nose-finger and heel-to-shin bilaterally.  Gait and station: Since hip surgery on the left, he has difficulty getting out of the wheelchair and needs bilateral support.  Reflexes: Deep tendon reflexes are symmetric and normal bilaterally.     DIAGNOSTIC DATA (LABS, IMAGING, TESTING) - I reviewed patient records, labs, notes, testing and imaging myself where available.  Lab Results  Component Value Date   WBC 6.4 09/16/2020   HGB 10.3 (L) 09/16/2020   HCT 32.2 (L) 09/16/2020   MCV 90 09/16/2020   PLT 343 09/16/2020      Component Value Date/Time   NA 141 09/16/2020 1533   K 4.7 09/16/2020 1533   CL 104 09/16/2020 1533   CO2 20 09/16/2020 1533   GLUCOSE 106 (H) 09/16/2020 1533   GLUCOSE 106 (H) 03/17/2017 0930   BUN 22 09/16/2020 1533   CREATININE 1.18 09/16/2020 1533   CREATININE 0.96 04/17/2013 1645   CALCIUM 9.3 09/16/2020 1533   PROT 6.8 04/16/2020 0942   ALBUMIN 4.5 04/16/2020 0942   AST 16 04/16/2020 0942   ALT 9 04/16/2020 0942   ALKPHOS 78 04/16/2020 0942   BILITOT 0.6 04/16/2020 0942   GFRNONAA 57 (L) 04/16/2020 04/18/2020  GFRAA 65 04/16/2020 0942   Lab Results  Component Value Date   CHOL 168 04/16/2020   HDL 46 04/16/2020   LDLCALC 96 04/16/2020   TRIG 147 04/16/2020   CHOLHDL 3.7 04/16/2020   Lab Results  Component Value Date   HGBA1C 5.5 03/17/2017   No results found for: VITAMINB12 Lab Results  Component Value Date   TSH 1.439 04/17/2013       ASSESSMENT AND PLAN  Left arm numbness  Orthostatic hypotension  Paroxysmal atrial fibrillation (HCC)  Unsteady gait   I most concerned that the spells of left arm numbness, drooping without altered consciousness or due to orthostatic hypotension, often triggered by a meal.  He is currently on fludrocortisone 0.1 mg daily and ProAmatine 10 mg 3 times a day.  I have tried to add Mestinon at the last visit but he may not have tolerated it and  it was stopped after only a day or 2.  I will increase the fludrocortisone to 0.2 mg daily in the hope that that will help him more. He should try to stay active as possible. Return to see me in 6 months or sooner if there are new or worsening neurologic symptoms.  45-minute office visit with the majority of the time spent face-to-face for history and physical, discussion/counseling and decision-making.  Additional time with record review and documentation.   Shivaan Tierno A. Epimenio Foot, MD, [facility] 05/04/2021, 4:12 PM Certified in Neurology, Clinical Neurophysiology, Sleep Medicine and Neuroimaging  [facility] [address], [address] [place], [place] [address] [phone]

## 2021-05-06 DIAGNOSIS — F411 Generalized anxiety disorder: Secondary | ICD-10-CM | POA: Diagnosis not present

## 2021-05-06 DIAGNOSIS — G8929 Other chronic pain: Secondary | ICD-10-CM | POA: Diagnosis not present

## 2021-05-06 DIAGNOSIS — H9190 Unspecified hearing loss, unspecified ear: Secondary | ICD-10-CM | POA: Diagnosis not present

## 2021-05-06 DIAGNOSIS — I2510 Atherosclerotic heart disease of native coronary artery without angina pectoris: Secondary | ICD-10-CM | POA: Diagnosis not present

## 2021-05-06 DIAGNOSIS — K5909 Other constipation: Secondary | ICD-10-CM | POA: Diagnosis not present

## 2021-05-06 DIAGNOSIS — I4891 Unspecified atrial fibrillation: Secondary | ICD-10-CM | POA: Diagnosis not present

## 2021-05-11 DIAGNOSIS — Z8673 Personal history of transient ischemic attack (TIA), and cerebral infarction without residual deficits: Secondary | ICD-10-CM | POA: Diagnosis not present

## 2021-05-11 DIAGNOSIS — Z23 Encounter for immunization: Secondary | ICD-10-CM | POA: Diagnosis not present

## 2021-05-11 DIAGNOSIS — I672 Cerebral atherosclerosis: Secondary | ICD-10-CM | POA: Diagnosis not present

## 2021-05-11 DIAGNOSIS — I951 Orthostatic hypotension: Secondary | ICD-10-CM | POA: Diagnosis not present

## 2021-05-11 DIAGNOSIS — F05 Delirium due to known physiological condition: Secondary | ICD-10-CM | POA: Diagnosis not present

## 2021-05-12 DIAGNOSIS — I4891 Unspecified atrial fibrillation: Secondary | ICD-10-CM | POA: Diagnosis not present

## 2021-05-13 DIAGNOSIS — H9190 Unspecified hearing loss, unspecified ear: Secondary | ICD-10-CM | POA: Diagnosis not present

## 2021-05-13 DIAGNOSIS — I2510 Atherosclerotic heart disease of native coronary artery without angina pectoris: Secondary | ICD-10-CM | POA: Diagnosis not present

## 2021-05-13 DIAGNOSIS — I4891 Unspecified atrial fibrillation: Secondary | ICD-10-CM | POA: Diagnosis not present

## 2021-05-13 DIAGNOSIS — K5909 Other constipation: Secondary | ICD-10-CM | POA: Diagnosis not present

## 2021-05-13 DIAGNOSIS — F411 Generalized anxiety disorder: Secondary | ICD-10-CM | POA: Diagnosis not present

## 2021-05-13 DIAGNOSIS — G8929 Other chronic pain: Secondary | ICD-10-CM | POA: Diagnosis not present

## 2021-05-14 DIAGNOSIS — I4891 Unspecified atrial fibrillation: Secondary | ICD-10-CM | POA: Diagnosis not present

## 2021-05-17 DIAGNOSIS — F411 Generalized anxiety disorder: Secondary | ICD-10-CM | POA: Diagnosis not present

## 2021-05-17 DIAGNOSIS — G8929 Other chronic pain: Secondary | ICD-10-CM | POA: Diagnosis not present

## 2021-05-17 DIAGNOSIS — H9190 Unspecified hearing loss, unspecified ear: Secondary | ICD-10-CM | POA: Diagnosis not present

## 2021-05-17 DIAGNOSIS — I2510 Atherosclerotic heart disease of native coronary artery without angina pectoris: Secondary | ICD-10-CM | POA: Diagnosis not present

## 2021-05-17 DIAGNOSIS — I4891 Unspecified atrial fibrillation: Secondary | ICD-10-CM | POA: Diagnosis not present

## 2021-05-17 DIAGNOSIS — K5909 Other constipation: Secondary | ICD-10-CM | POA: Diagnosis not present

## 2021-05-18 DIAGNOSIS — I2510 Atherosclerotic heart disease of native coronary artery without angina pectoris: Secondary | ICD-10-CM | POA: Diagnosis not present

## 2021-05-18 DIAGNOSIS — I4891 Unspecified atrial fibrillation: Secondary | ICD-10-CM | POA: Diagnosis not present

## 2021-05-18 DIAGNOSIS — F411 Generalized anxiety disorder: Secondary | ICD-10-CM | POA: Diagnosis not present

## 2021-05-18 DIAGNOSIS — H9190 Unspecified hearing loss, unspecified ear: Secondary | ICD-10-CM | POA: Diagnosis not present

## 2021-05-18 DIAGNOSIS — G8929 Other chronic pain: Secondary | ICD-10-CM | POA: Diagnosis not present

## 2021-05-18 DIAGNOSIS — K5909 Other constipation: Secondary | ICD-10-CM | POA: Diagnosis not present

## 2021-05-21 DIAGNOSIS — I2510 Atherosclerotic heart disease of native coronary artery without angina pectoris: Secondary | ICD-10-CM | POA: Diagnosis not present

## 2021-05-21 DIAGNOSIS — K5909 Other constipation: Secondary | ICD-10-CM | POA: Diagnosis not present

## 2021-05-21 DIAGNOSIS — H9190 Unspecified hearing loss, unspecified ear: Secondary | ICD-10-CM | POA: Diagnosis not present

## 2021-05-21 DIAGNOSIS — G8929 Other chronic pain: Secondary | ICD-10-CM | POA: Diagnosis not present

## 2021-05-21 DIAGNOSIS — F411 Generalized anxiety disorder: Secondary | ICD-10-CM | POA: Diagnosis not present

## 2021-05-21 DIAGNOSIS — I4891 Unspecified atrial fibrillation: Secondary | ICD-10-CM | POA: Diagnosis not present

## 2021-05-22 DIAGNOSIS — H9190 Unspecified hearing loss, unspecified ear: Secondary | ICD-10-CM | POA: Diagnosis not present

## 2021-05-22 DIAGNOSIS — I2510 Atherosclerotic heart disease of native coronary artery without angina pectoris: Secondary | ICD-10-CM | POA: Diagnosis not present

## 2021-05-22 DIAGNOSIS — G8929 Other chronic pain: Secondary | ICD-10-CM | POA: Diagnosis not present

## 2021-05-22 DIAGNOSIS — Z8673 Personal history of transient ischemic attack (TIA), and cerebral infarction without residual deficits: Secondary | ICD-10-CM | POA: Diagnosis not present

## 2021-05-22 DIAGNOSIS — F0394 Unspecified dementia, unspecified severity, with anxiety: Secondary | ICD-10-CM | POA: Diagnosis not present

## 2021-05-22 DIAGNOSIS — E785 Hyperlipidemia, unspecified: Secondary | ICD-10-CM | POA: Diagnosis not present

## 2021-05-22 DIAGNOSIS — K5909 Other constipation: Secondary | ICD-10-CM | POA: Diagnosis not present

## 2021-05-22 DIAGNOSIS — I129 Hypertensive chronic kidney disease with stage 1 through stage 4 chronic kidney disease, or unspecified chronic kidney disease: Secondary | ICD-10-CM | POA: Diagnosis not present

## 2021-05-22 DIAGNOSIS — N183 Chronic kidney disease, stage 3 unspecified: Secondary | ICD-10-CM | POA: Diagnosis not present

## 2021-05-22 DIAGNOSIS — F411 Generalized anxiety disorder: Secondary | ICD-10-CM | POA: Diagnosis not present

## 2021-05-22 DIAGNOSIS — Z951 Presence of aortocoronary bypass graft: Secondary | ICD-10-CM | POA: Diagnosis not present

## 2021-05-22 DIAGNOSIS — M80052D Age-related osteoporosis with current pathological fracture, left femur, subsequent encounter for fracture with routine healing: Secondary | ICD-10-CM | POA: Diagnosis not present

## 2021-05-22 DIAGNOSIS — D631 Anemia in chronic kidney disease: Secondary | ICD-10-CM | POA: Diagnosis not present

## 2021-05-22 DIAGNOSIS — I4891 Unspecified atrial fibrillation: Secondary | ICD-10-CM | POA: Diagnosis not present

## 2021-05-22 DIAGNOSIS — K219 Gastro-esophageal reflux disease without esophagitis: Secondary | ICD-10-CM | POA: Diagnosis not present

## 2021-05-22 DIAGNOSIS — Z7901 Long term (current) use of anticoagulants: Secondary | ICD-10-CM | POA: Diagnosis not present

## 2021-05-24 DIAGNOSIS — H9190 Unspecified hearing loss, unspecified ear: Secondary | ICD-10-CM | POA: Diagnosis not present

## 2021-05-24 DIAGNOSIS — I2510 Atherosclerotic heart disease of native coronary artery without angina pectoris: Secondary | ICD-10-CM | POA: Diagnosis not present

## 2021-05-24 DIAGNOSIS — F411 Generalized anxiety disorder: Secondary | ICD-10-CM | POA: Diagnosis not present

## 2021-05-24 DIAGNOSIS — K5909 Other constipation: Secondary | ICD-10-CM | POA: Diagnosis not present

## 2021-05-24 DIAGNOSIS — I4891 Unspecified atrial fibrillation: Secondary | ICD-10-CM | POA: Diagnosis not present

## 2021-05-24 DIAGNOSIS — G8929 Other chronic pain: Secondary | ICD-10-CM | POA: Diagnosis not present

## 2021-05-25 DIAGNOSIS — I4891 Unspecified atrial fibrillation: Secondary | ICD-10-CM | POA: Diagnosis not present

## 2021-05-25 DIAGNOSIS — I2510 Atherosclerotic heart disease of native coronary artery without angina pectoris: Secondary | ICD-10-CM | POA: Diagnosis not present

## 2021-05-25 DIAGNOSIS — G8929 Other chronic pain: Secondary | ICD-10-CM | POA: Diagnosis not present

## 2021-05-25 DIAGNOSIS — F411 Generalized anxiety disorder: Secondary | ICD-10-CM | POA: Diagnosis not present

## 2021-05-25 DIAGNOSIS — K5909 Other constipation: Secondary | ICD-10-CM | POA: Diagnosis not present

## 2021-05-25 DIAGNOSIS — H9190 Unspecified hearing loss, unspecified ear: Secondary | ICD-10-CM | POA: Diagnosis not present

## 2021-05-26 DIAGNOSIS — I2510 Atherosclerotic heart disease of native coronary artery without angina pectoris: Secondary | ICD-10-CM | POA: Diagnosis not present

## 2021-05-26 DIAGNOSIS — K5909 Other constipation: Secondary | ICD-10-CM | POA: Diagnosis not present

## 2021-05-26 DIAGNOSIS — H9190 Unspecified hearing loss, unspecified ear: Secondary | ICD-10-CM | POA: Diagnosis not present

## 2021-05-26 DIAGNOSIS — G8929 Other chronic pain: Secondary | ICD-10-CM | POA: Diagnosis not present

## 2021-05-26 DIAGNOSIS — F411 Generalized anxiety disorder: Secondary | ICD-10-CM | POA: Diagnosis not present

## 2021-05-26 DIAGNOSIS — I4891 Unspecified atrial fibrillation: Secondary | ICD-10-CM | POA: Diagnosis not present

## 2021-05-27 ENCOUNTER — Telehealth: Payer: Self-pay | Admitting: Neurology

## 2021-05-27 DIAGNOSIS — G8929 Other chronic pain: Secondary | ICD-10-CM | POA: Diagnosis not present

## 2021-05-27 DIAGNOSIS — K5909 Other constipation: Secondary | ICD-10-CM | POA: Diagnosis not present

## 2021-05-27 DIAGNOSIS — I2510 Atherosclerotic heart disease of native coronary artery without angina pectoris: Secondary | ICD-10-CM | POA: Diagnosis not present

## 2021-05-27 DIAGNOSIS — I4891 Unspecified atrial fibrillation: Secondary | ICD-10-CM | POA: Diagnosis not present

## 2021-05-27 DIAGNOSIS — H9190 Unspecified hearing loss, unspecified ear: Secondary | ICD-10-CM | POA: Diagnosis not present

## 2021-05-27 DIAGNOSIS — F411 Generalized anxiety disorder: Secondary | ICD-10-CM | POA: Diagnosis not present

## 2021-05-27 NOTE — Telephone Encounter (Signed)
Called and no answer. If Tiffany calls back please relay message per Baird Lyons, [place].

## 2021-05-27 NOTE — Telephone Encounter (Signed)
This is being prescribed by the patient's cardiologist. They should contact their office about making changes to the dosage.

## 2021-05-27 NOTE — Telephone Encounter (Signed)
[facility]) called, midodrine (PROAMATINE) 10 MG tablet was recently decreased to 3 times a day.  Has had near falls and weakness and continue low blood pressure. Want to return to 4 times a day. Would like a call from the nurse.

## 2021-06-01 DIAGNOSIS — G8929 Other chronic pain: Secondary | ICD-10-CM | POA: Diagnosis not present

## 2021-06-01 DIAGNOSIS — F411 Generalized anxiety disorder: Secondary | ICD-10-CM | POA: Diagnosis not present

## 2021-06-01 DIAGNOSIS — I4891 Unspecified atrial fibrillation: Secondary | ICD-10-CM | POA: Diagnosis not present

## 2021-06-01 DIAGNOSIS — K5909 Other constipation: Secondary | ICD-10-CM | POA: Diagnosis not present

## 2021-06-01 DIAGNOSIS — H9190 Unspecified hearing loss, unspecified ear: Secondary | ICD-10-CM | POA: Diagnosis not present

## 2021-06-01 DIAGNOSIS — I2510 Atherosclerotic heart disease of native coronary artery without angina pectoris: Secondary | ICD-10-CM | POA: Diagnosis not present

## 2021-06-02 DIAGNOSIS — H9190 Unspecified hearing loss, unspecified ear: Secondary | ICD-10-CM | POA: Diagnosis not present

## 2021-06-02 DIAGNOSIS — I4891 Unspecified atrial fibrillation: Secondary | ICD-10-CM | POA: Diagnosis not present

## 2021-06-02 DIAGNOSIS — I2510 Atherosclerotic heart disease of native coronary artery without angina pectoris: Secondary | ICD-10-CM | POA: Diagnosis not present

## 2021-06-02 DIAGNOSIS — K5909 Other constipation: Secondary | ICD-10-CM | POA: Diagnosis not present

## 2021-06-02 DIAGNOSIS — G8929 Other chronic pain: Secondary | ICD-10-CM | POA: Diagnosis not present

## 2021-06-02 DIAGNOSIS — F411 Generalized anxiety disorder: Secondary | ICD-10-CM | POA: Diagnosis not present

## 2021-06-04 DIAGNOSIS — I4891 Unspecified atrial fibrillation: Secondary | ICD-10-CM | POA: Diagnosis not present

## 2021-06-04 DIAGNOSIS — G8929 Other chronic pain: Secondary | ICD-10-CM | POA: Diagnosis not present

## 2021-06-04 DIAGNOSIS — K5909 Other constipation: Secondary | ICD-10-CM | POA: Diagnosis not present

## 2021-06-04 DIAGNOSIS — H9190 Unspecified hearing loss, unspecified ear: Secondary | ICD-10-CM | POA: Diagnosis not present

## 2021-06-04 DIAGNOSIS — F411 Generalized anxiety disorder: Secondary | ICD-10-CM | POA: Diagnosis not present

## 2021-06-04 DIAGNOSIS — I2510 Atherosclerotic heart disease of native coronary artery without angina pectoris: Secondary | ICD-10-CM | POA: Diagnosis not present

## 2021-06-07 DIAGNOSIS — S72002A Fracture of unspecified part of neck of left femur, initial encounter for closed fracture: Secondary | ICD-10-CM | POA: Diagnosis not present

## 2021-06-08 DIAGNOSIS — R918 Other nonspecific abnormal finding of lung field: Secondary | ICD-10-CM | POA: Diagnosis not present

## 2021-06-08 DIAGNOSIS — K449 Diaphragmatic hernia without obstruction or gangrene: Secondary | ICD-10-CM | POA: Diagnosis not present

## 2021-06-08 DIAGNOSIS — R0902 Hypoxemia: Secondary | ICD-10-CM | POA: Diagnosis not present

## 2021-06-08 DIAGNOSIS — I2510 Atherosclerotic heart disease of native coronary artery without angina pectoris: Secondary | ICD-10-CM | POA: Diagnosis not present

## 2021-06-08 DIAGNOSIS — I4891 Unspecified atrial fibrillation: Secondary | ICD-10-CM | POA: Diagnosis not present

## 2021-06-08 DIAGNOSIS — H9190 Unspecified hearing loss, unspecified ear: Secondary | ICD-10-CM | POA: Diagnosis not present

## 2021-06-08 DIAGNOSIS — I252 Old myocardial infarction: Secondary | ICD-10-CM | POA: Diagnosis not present

## 2021-06-08 DIAGNOSIS — G8929 Other chronic pain: Secondary | ICD-10-CM | POA: Diagnosis not present

## 2021-06-08 DIAGNOSIS — Z7901 Long term (current) use of anticoagulants: Secondary | ICD-10-CM | POA: Diagnosis not present

## 2021-06-08 DIAGNOSIS — Z955 Presence of coronary angioplasty implant and graft: Secondary | ICD-10-CM | POA: Diagnosis not present

## 2021-06-08 DIAGNOSIS — E785 Hyperlipidemia, unspecified: Secondary | ICD-10-CM | POA: Diagnosis not present

## 2021-06-08 DIAGNOSIS — R410 Disorientation, unspecified: Secondary | ICD-10-CM | POA: Diagnosis not present

## 2021-06-08 DIAGNOSIS — R4781 Slurred speech: Secondary | ICD-10-CM | POA: Diagnosis not present

## 2021-06-08 DIAGNOSIS — G459 Transient cerebral ischemic attack, unspecified: Secondary | ICD-10-CM | POA: Diagnosis not present

## 2021-06-08 DIAGNOSIS — R531 Weakness: Secondary | ICD-10-CM | POA: Diagnosis not present

## 2021-06-08 DIAGNOSIS — K5909 Other constipation: Secondary | ICD-10-CM | POA: Diagnosis not present

## 2021-06-08 DIAGNOSIS — I340 Nonrheumatic mitral (valve) insufficiency: Secondary | ICD-10-CM

## 2021-06-08 DIAGNOSIS — I77810 Thoracic aortic ectasia: Secondary | ICD-10-CM | POA: Diagnosis not present

## 2021-06-08 DIAGNOSIS — Z79899 Other long term (current) drug therapy: Secondary | ICD-10-CM | POA: Diagnosis not present

## 2021-06-08 DIAGNOSIS — I959 Hypotension, unspecified: Secondary | ICD-10-CM | POA: Diagnosis not present

## 2021-06-08 DIAGNOSIS — Z7902 Long term (current) use of antithrombotics/antiplatelets: Secondary | ICD-10-CM | POA: Diagnosis not present

## 2021-06-08 DIAGNOSIS — Z8673 Personal history of transient ischemic attack (TIA), and cerebral infarction without residual deficits: Secondary | ICD-10-CM | POA: Diagnosis not present

## 2021-06-08 DIAGNOSIS — K219 Gastro-esophageal reflux disease without esophagitis: Secondary | ICD-10-CM | POA: Diagnosis not present

## 2021-06-08 DIAGNOSIS — I10 Essential (primary) hypertension: Secondary | ICD-10-CM | POA: Diagnosis not present

## 2021-06-08 DIAGNOSIS — I482 Chronic atrial fibrillation, unspecified: Secondary | ICD-10-CM | POA: Diagnosis not present

## 2021-06-08 DIAGNOSIS — F411 Generalized anxiety disorder: Secondary | ICD-10-CM | POA: Diagnosis not present

## 2021-06-08 DIAGNOSIS — I517 Cardiomegaly: Secondary | ICD-10-CM | POA: Diagnosis not present

## 2021-06-09 DIAGNOSIS — I428 Other cardiomyopathies: Secondary | ICD-10-CM | POA: Diagnosis not present

## 2021-06-11 DIAGNOSIS — H9190 Unspecified hearing loss, unspecified ear: Secondary | ICD-10-CM | POA: Diagnosis not present

## 2021-06-11 DIAGNOSIS — I4891 Unspecified atrial fibrillation: Secondary | ICD-10-CM | POA: Diagnosis not present

## 2021-06-11 DIAGNOSIS — K5909 Other constipation: Secondary | ICD-10-CM | POA: Diagnosis not present

## 2021-06-11 DIAGNOSIS — I2510 Atherosclerotic heart disease of native coronary artery without angina pectoris: Secondary | ICD-10-CM | POA: Diagnosis not present

## 2021-06-11 DIAGNOSIS — F411 Generalized anxiety disorder: Secondary | ICD-10-CM | POA: Diagnosis not present

## 2021-06-11 DIAGNOSIS — G8929 Other chronic pain: Secondary | ICD-10-CM | POA: Diagnosis not present

## 2021-06-14 DIAGNOSIS — F411 Generalized anxiety disorder: Secondary | ICD-10-CM | POA: Diagnosis not present

## 2021-06-14 DIAGNOSIS — H9190 Unspecified hearing loss, unspecified ear: Secondary | ICD-10-CM | POA: Diagnosis not present

## 2021-06-14 DIAGNOSIS — I2510 Atherosclerotic heart disease of native coronary artery without angina pectoris: Secondary | ICD-10-CM | POA: Diagnosis not present

## 2021-06-14 DIAGNOSIS — G8929 Other chronic pain: Secondary | ICD-10-CM | POA: Diagnosis not present

## 2021-06-14 DIAGNOSIS — I4891 Unspecified atrial fibrillation: Secondary | ICD-10-CM | POA: Diagnosis not present

## 2021-06-14 DIAGNOSIS — K5909 Other constipation: Secondary | ICD-10-CM | POA: Diagnosis not present

## 2021-06-17 DIAGNOSIS — K5909 Other constipation: Secondary | ICD-10-CM | POA: Diagnosis not present

## 2021-06-17 DIAGNOSIS — I2510 Atherosclerotic heart disease of native coronary artery without angina pectoris: Secondary | ICD-10-CM | POA: Diagnosis not present

## 2021-06-17 DIAGNOSIS — F411 Generalized anxiety disorder: Secondary | ICD-10-CM | POA: Diagnosis not present

## 2021-06-17 DIAGNOSIS — H9190 Unspecified hearing loss, unspecified ear: Secondary | ICD-10-CM | POA: Diagnosis not present

## 2021-06-17 DIAGNOSIS — I4891 Unspecified atrial fibrillation: Secondary | ICD-10-CM | POA: Diagnosis not present

## 2021-06-17 DIAGNOSIS — G8929 Other chronic pain: Secondary | ICD-10-CM | POA: Diagnosis not present

## 2021-06-18 DIAGNOSIS — K5909 Other constipation: Secondary | ICD-10-CM | POA: Diagnosis not present

## 2021-06-18 DIAGNOSIS — H9190 Unspecified hearing loss, unspecified ear: Secondary | ICD-10-CM | POA: Diagnosis not present

## 2021-06-18 DIAGNOSIS — F411 Generalized anxiety disorder: Secondary | ICD-10-CM | POA: Diagnosis not present

## 2021-06-18 DIAGNOSIS — G8929 Other chronic pain: Secondary | ICD-10-CM | POA: Diagnosis not present

## 2021-06-18 DIAGNOSIS — I2510 Atherosclerotic heart disease of native coronary artery without angina pectoris: Secondary | ICD-10-CM | POA: Diagnosis not present

## 2021-06-18 DIAGNOSIS — I4891 Unspecified atrial fibrillation: Secondary | ICD-10-CM | POA: Diagnosis not present

## 2021-07-15 DIAGNOSIS — R296 Repeated falls: Secondary | ICD-10-CM | POA: Diagnosis not present

## 2021-07-15 DIAGNOSIS — Z0000 Encounter for general adult medical examination without abnormal findings: Secondary | ICD-10-CM | POA: Diagnosis not present

## 2021-07-15 DIAGNOSIS — I25709 Atherosclerosis of coronary artery bypass graft(s), unspecified, with unspecified angina pectoris: Secondary | ICD-10-CM | POA: Diagnosis not present

## 2021-07-15 DIAGNOSIS — Z79899 Other long term (current) drug therapy: Secondary | ICD-10-CM | POA: Diagnosis not present

## 2021-07-15 DIAGNOSIS — E785 Hyperlipidemia, unspecified: Secondary | ICD-10-CM | POA: Diagnosis not present

## 2021-07-15 DIAGNOSIS — R7302 Impaired glucose tolerance (oral): Secondary | ICD-10-CM | POA: Diagnosis not present

## 2021-07-15 DIAGNOSIS — M6281 Muscle weakness (generalized): Secondary | ICD-10-CM | POA: Diagnosis not present

## 2021-08-05 ENCOUNTER — Ambulatory Visit (INDEPENDENT_AMBULATORY_CARE_PROVIDER_SITE_OTHER): Payer: Medicare Other | Admitting: Cardiology

## 2021-08-05 ENCOUNTER — Encounter: Payer: Self-pay | Admitting: Cardiology

## 2021-08-05 ENCOUNTER — Other Ambulatory Visit: Payer: Self-pay

## 2021-08-05 VITALS — BP 104/58 | HR 72 | Ht 74.0 in

## 2021-08-05 DIAGNOSIS — I951 Orthostatic hypotension: Secondary | ICD-10-CM

## 2021-08-05 DIAGNOSIS — E785 Hyperlipidemia, unspecified: Secondary | ICD-10-CM | POA: Diagnosis not present

## 2021-08-05 DIAGNOSIS — I480 Paroxysmal atrial fibrillation: Secondary | ICD-10-CM

## 2021-08-05 DIAGNOSIS — I110 Hypertensive heart disease with heart failure: Secondary | ICD-10-CM | POA: Diagnosis not present

## 2021-08-05 DIAGNOSIS — I25810 Atherosclerosis of coronary artery bypass graft(s) without angina pectoris: Secondary | ICD-10-CM

## 2021-08-05 DIAGNOSIS — I7789 Other specified disorders of arteries and arterioles: Secondary | ICD-10-CM

## 2021-08-05 DIAGNOSIS — Z7901 Long term (current) use of anticoagulants: Secondary | ICD-10-CM | POA: Diagnosis not present

## 2021-08-05 DIAGNOSIS — I5042 Chronic combined systolic (congestive) and diastolic (congestive) heart failure: Secondary | ICD-10-CM | POA: Diagnosis not present

## 2021-08-05 MED ORDER — METOPROLOL TARTRATE 25 MG PO TABS: 12.5000 mg | ORAL_TABLET | Freq: Every day | ORAL | 1 refills | Status: AC

## 2021-08-05 NOTE — Patient Instructions (Signed)
Medication Instructions:  Your physician has recommended you make the following change in your medication:  DECREASE: Lopressor to 0.5 tablet daily *If you need a refill on your cardiac medications before your next appointment, please call your pharmacy*   Lab Work: None If you have labs (blood work) drawn today and your tests are completely normal, you will receive your results only by: MyChart Message (if you have MyChart) OR A paper copy in the mail If you have any lab test that is abnormal or we need to change your treatment, we will call you to review the results.   Testing/Procedures: None   Follow-Up: At [facility], you and your health needs are our priority.  As part of our continuing mission to provide you with exceptional heart care, we have created designated Provider Care Teams.  These Care Teams include your primary Cardiologist (physician) and Advanced Practice Providers (APPs -  Physician Assistants and Nurse Practitioners) who all work together to provide you with the care you need, when you need it.  We recommend signing up for the patient portal called "MyChart".  Sign up information is provided on this After Visit Summary.  MyChart is used to connect with patients for Virtual Visits (Telemedicine).  Patients are able to view lab/test results, encounter notes, upcoming appointments, etc.  Non-urgent messages can be sent to your provider as well.   To learn more about what you can do with MyChart, go to [email or web address].    Your next appointment:   3 month(s)  The format for your next appointment:   In Person  Provider:   Norman Herrlich, MD   Other Instructions

## 2021-08-05 NOTE — Progress Notes (Signed)
Cardiology Office Note:    Date:  08/05/2021   ID:  Joshua Chambers, DOB 1923-01-20, MRN [id number]  PCP:  Street, Sharon Mt, MD  Cardiologist:  Shirlee More, MD    Referring MD: [address], Sharon Mt, *    ASSESSMENT:    1. Orthostatic hypotension   2. Paroxysmal atrial fibrillation (HCC)   3. Chronic anticoagulation   4. Coronary artery disease involving coronary bypass graft of native heart without angina pectoris; CAD (coronary artery disease) of artery bypass graft:  loss of VG to RCA and VG-Diag   5. Hypertensive heart disease with chronic combined systolic and diastolic congestive heart failure ([place])   6. Dyslipidemia, goal LDL below 70   7. Ascending aorta enlargement (HCC)    PLAN:    In order of problems listed above:  His biggest problem is hypotension lightheadedness at times TIAs he is on high-dose midodrine we will restart he is abdominal binder to block the episodes with mesenteric pooling of blood.  We will reduce his beta-blocker dose once Stable not having rapid heart rhythms and tolerates his low-dose anticoagulant Stable CAD is not having anginal discomfort No evidence of heart failure on physical exam presently not taking a diuretic No longer on lipid-lowering therapy Noted I would not do serial testing in his age group but I will consider him a candidate for elective revascularization  Next appointment: 6 months   Medication Adjustments/Labs and Tests Ordered: Current medicines are reviewed at length with the patient today.  Concerns regarding medicines are outlined above.  No orders of the defined types were placed in this encounter.  No orders of the defined types were placed in this encounter.   Chief Complaint  Patient presents with   Follow-up   Hypotension    History of Present Illness:    Joshua Chambers is a 88 y.o. male with a hx of quite complex heart disease including CAD remote CABG 2007 hypertensive heart disease symptomatic  orthostatic hypotension requiring midodrine therapy paroxysmal atrial fibrillation with TIA and recurrent GI bleeding that has required transfusion last seen 01/14/2021.  He was recently evaluated by neurology 05/04/2021 with left arm numbness and loss of strength.  Compliance with diet, lifestyle and medications: Yes  His son is present participates in evaluation decision making He continues to have episodes of lightheadedness but they are not severe he has had no falls trauma or syncope He is on high-dose midodrine and cannot take Florinef because of previous heart failure He is not having angina palpitation syncope shortness of breath and tolerates reduced dose anticoagulant without bleeding Both of [place] think he could not get support hose on I will take advantage of an abdominal binder to blunt his hypotensive episodes and further reduce the dose of his beta-blocker with well-controlled atrial fibrillation  He was seen in [facility] 06/08/2021 admitted for 24 hours and presented with symptomatic hypotension hemoglobin mildly diminished 10.8 creatinine 0.5 sodium 139 potassium 4.0 echocardiogram was performed EF 45 to 50% right ventricle mildly enlarged normal function he had mild aortic and mitral regurgitation.  I reviewed the EKG performed 06/09/2021 independently sinus rhythm nonspecific ST abnormality had a CT of the chest showing his ascending aorta 44 mm enlarged.  This is a new problem  Past Medical History:  Diagnosis Date   Arteriosclerosis of arterial coronary artery bypass graft    Arthritis    Atrial contractions, premature    Atrial fibrillation (HCC)    Benign hypertension  Bilateral carotid artery stenosis    CAD (coronary artery disease), autologous vein bypass graft 04/19/2013   100% flush occlusion of SVG-OM & SVG-Diag; near Subtotal Occlusion of SVG-RCA (PCI of RCA done to avoid distal embolization with PCI to SVG)   CAD (coronary artery disease), native coronary  artery 2007   Multivessel CAD referred for CABG   CAD S/P percutaneous coronary angioplasty  04/19/2013   PCI of Native RCA - Promus Premier DES 3.0 mm x 38 mm (3.6 mm);    Chronic anticoagulation 04/17/2018   Chronic combined systolic and diastolic heart failure ([place])    Coronary artery disease involving coronary bypass graft of native heart without angina pectoris; CAD (coronary artery disease) of artery bypass graft:  loss of VG to RCA and VG-Diag 04/17/2013   Dyslipidemia    Dyslipidemia, goal LDL below 70    Dyspnea 04/30/2013   Essential hypertension 05/02/2013   Fatigue 04/17/2013   GERD (gastroesophageal reflux disease)    Hematuria 05/02/2013   History of DVT of lower extremity    Right popliteal vein   History of stroke August 2011   Possible TIA in 2001;  Echo: EF 45-50%, mild HK of anterior septum.   History of transient cerebral ischemia    Hx of orthostatic hypotension 2009   Positive tilt table:  dizziness/vasopressor type; previously on the decision    Hx SBO    Ischemic cardiomyopathy 02/06/2018   Nephrolithiasis    Orthostatic hypotension    Other and unspecified coagulation defects 04/17/2013   PAF (paroxysmal atrial fibrillation) ([place]) 02/06/2018   Premature atrial contractions 07/28/2015   Preoperative examination, unspecified 04/17/2013   S/P CABG x 3 2007   LIMA-LAD, SVG-DIAG, SVG-RCA   S/P CABG x 4; LIMA-LAD, SVG-RPDA, SVG-OM, SVG-D1 -- all but LIMA-LAD occluded 03/18/2006   03/2006 - LIMA-LAD, SVG-D1, SVG-RPDA    Sinus arrhythmia 09/09/2016   Stroke [facility])    TIA (transient ischemic attack) 03/16/2017   Unsteady gait     Past Surgical History:  Procedure Laterality Date   APPENDECTOMY     CARDIAC SURGERY  2000s   COLON SURGERY     CORONARY ANGIOPLASTY WITH STENT PLACEMENT  04/19/13   Stent-Promus DES (3.0 mm x 38 mm - 3.6 mm) to native RCA prox-mid,   CORONARY ARTERY BYPASS GRAFT  2007   LIMA-LAD, SVG-D1, SVG-RCA   LEFT HEART CATHETERIZATION WITH  CORONARY/GRAFT ANGIOGRAM Left 04/19/2013   LEFT HEART CATH W/ CORONARY/GRAFT ANGIOGRAM;  Leonie Man, MD;  Location: [facility] CATH LAB;  LM 20%-->LAD 70-80% then subTO after D1 (Patent LIMA-LAD); Small non-dom Cx ~20% ostial.  RCA long 60-70% prox --> 90% mid.  SVG-OM & SVG-Diag 100%, diffuse Subtotal occlusion of SVG-rPD   STENT PLACEMENT VASCULAR ([facility] HX)     TONSILLECTOMY      Current Medications: Current Meds  Medication Sig   Acetaminophen (TYLENOL EXTRA STRENGTH PO) Take 1,000 mg by mouth 2 (two) times daily as needed (PAIN).   apixaban (ELIQUIS) 2.5 MG TABS tablet Take 1 tablet (2.5 mg total) by mouth 2 (two) times daily.   atorvastatin (LIPITOR) 20 MG tablet Take 20 mg by mouth at bedtime.   esomeprazole (NEXIUM) 20 MG capsule Take 20 mg by mouth daily at 12 noon.   fludrocortisone (FLORINEF) 0.1 MG tablet Take 2 tablets (0.2 mg total) by mouth daily.   metoprolol tartrate (LOPRESSOR) 25 MG tablet TAKE 1/2 TABLET(12.5 MG) BY MOUTH TWICE DAILY   midodrine (PROAMATINE) 10 MG tablet TAKE  1 TABLET(10 MG) BY MOUTH FOUR TIMES DAILY (Patient taking differently: Take 10 mg by mouth 3 (three) times daily.)   nitroGLYCERIN (NITROSTAT) 0.4 MG SL tablet Place 1 tablet (0.4 mg total) under the tongue every 5 (five) minutes as needed for chest pain.   oxyCODONE (OXY IR/ROXICODONE) 5 MG immediate release tablet Take 5 mg by mouth every 8 (eight) hours as needed for moderate pain or severe pain.   Polyvinyl Alcohol-Povidone (REFRESH OP) Place 1 drop into both eyes daily as needed (DRY EYES).   PULMICORT FLEXHALER 180 MCG/ACT inhaler Inhale 2 puffs into the lungs daily.    pyridostigmine (MESTINON) 60 MG tablet 1/2 to 1 pill po tid   QUEtiapine (SEROQUEL) 25 MG tablet Take 12.5 mg by mouth at bedtime.   Sennosides (SENOKOT PO) Take 5 mg by mouth 2 (two) times daily as needed (CONSTIPATION).     Allergies:   Amiodarone, Diltiazem, Statins, and Sulfa antibiotics   Social History   Socioeconomic  History   Marital status: Married    Spouse name: Not on file   Number of children: Not on file   Years of education: Not on file   Highest education level: Not on file  Occupational History   Not on file  Tobacco Use   Smoking status: Never    Passive exposure: Never   Smokeless tobacco: Never  Vaping Use   Vaping Use: Never used  Substance and Sexual Activity   Alcohol use: Yes    Comment: beer w/ pizza rarely   Drug use: No   Sexual activity: Not on file  Other Topics Concern   Not on file  Social History Narrative    He is now remarried for the last 2 years, his first wife of 40 years is deceased. He has 1child from that first marriage and 2 grandchildren. He does not smoke, does not drink. He is not very active but he does kind of meander around with his new wife who is permanently disabled.    Right handed   Caffeine use: coffee/soda daily   Social Determinants of Health   Financial Resource Strain: Not on file  Food Insecurity: Not on file  Transportation Needs: Not on file  Physical Activity: Not on file  Stress: Not on file  Social Connections: Not on file     Family History: The patient's family history includes Colon cancer in his father; Hypertension in his son. ROS:   Please see the history of present illness.    All other systems reviewed and are negative.  EKGs/Labs/Other Studies Reviewed:    The following studies were reviewed today:    Recent Labs: 09/16/2020: BUN 22; Creatinine, Ser 1.18; Hemoglobin 10.3; Platelets 343; Potassium 4.7; Sodium 141  Recent Lipid Panel    Component Value Date/Time   CHOL 168 04/16/2020 0942   TRIG 147 04/16/2020 0942   HDL 46 04/16/2020 0942   CHOLHDL 3.7 04/16/2020 0942   CHOLHDL 3.7 03/17/2017 0006   VLDL 21 03/17/2017 0006   LDLCALC 96 04/16/2020 0942    Physical Exam:    VS:  BP (!) 104/58 (BP Location: Left Arm)    Pulse 72    Ht 6\' 2"  (1.88 m)    SpO2 97%    BMI 24.27 kg/m     Wt Readings from Last  3 Encounters:  12/29/20 189 lb (85.7 kg)  10/07/20 204 lb 3.2 oz (92.6 kg)  09/16/20 201 lb (91.2 kg)     GEN: He  appears his age but not chronically ill in no acute distress HEENT: Normal NECK: No JVD; No carotid bruits LYMPHATICS: No lymphadenopathy CARDIAC: Irregular rate and rhythm RRR, no murmurs, rubs, gallops RESPIRATORY:  Clear to auscultation without rales, wheezing or rhonchi  ABDOMEN: Soft, non-tender, non-distended MUSCULOSKELETAL:  No edema; No deformity  SKIN: Warm and dry NEUROLOGIC:  Alert and oriented x 3 PSYCHIATRIC:  Normal affect    Signed, Shirlee More, MD  08/05/2021 2:10 PM    [facility]

## 2021-10-28 DIAGNOSIS — N138 Other obstructive and reflux uropathy: Secondary | ICD-10-CM | POA: Diagnosis not present

## 2021-10-28 DIAGNOSIS — N401 Enlarged prostate with lower urinary tract symptoms: Secondary | ICD-10-CM | POA: Diagnosis not present

## 2021-10-29 DIAGNOSIS — R300 Dysuria: Secondary | ICD-10-CM | POA: Diagnosis not present

## 2021-11-08 ENCOUNTER — Telehealth: Payer: Self-pay | Admitting: Cardiology

## 2021-11-08 NOTE — Telephone Encounter (Signed)
Patient calling the office for samples of medication:   1.  What medication and dosage are you requesting samples for? Eliquis  2.  Are you currently out of this medication? Just a few left    

## 2021-11-08 NOTE — Telephone Encounter (Signed)
Left a message for the patient stating samples of Eliquis 2.5 mg are waiting to be picked up for him.  Left call back number if they have any questions. ?

## 2021-11-09 ENCOUNTER — Ambulatory Visit (INDEPENDENT_AMBULATORY_CARE_PROVIDER_SITE_OTHER): Payer: Medicare Other | Admitting: Neurology

## 2021-11-09 ENCOUNTER — Encounter: Payer: Self-pay | Admitting: Neurology

## 2021-11-09 VITALS — BP 128/66 | HR 63 | Ht 74.0 in

## 2021-11-09 DIAGNOSIS — I951 Orthostatic hypotension: Secondary | ICD-10-CM

## 2021-11-09 DIAGNOSIS — R2681 Unsteadiness on feet: Secondary | ICD-10-CM

## 2021-11-09 DIAGNOSIS — I25810 Atherosclerosis of coronary artery bypass graft(s) without angina pectoris: Secondary | ICD-10-CM | POA: Diagnosis not present

## 2021-11-09 DIAGNOSIS — G8929 Other chronic pain: Secondary | ICD-10-CM | POA: Diagnosis not present

## 2021-11-09 DIAGNOSIS — M25512 Pain in left shoulder: Secondary | ICD-10-CM

## 2021-11-09 DIAGNOSIS — M19012 Primary osteoarthritis, left shoulder: Secondary | ICD-10-CM | POA: Diagnosis not present

## 2021-11-09 DIAGNOSIS — I480 Paroxysmal atrial fibrillation: Secondary | ICD-10-CM | POA: Diagnosis not present

## 2021-11-09 HISTORY — DX: Other chronic pain: G89.29

## 2021-11-09 HISTORY — DX: Primary osteoarthritis, left shoulder: M19.012

## 2021-11-09 MED ORDER — FLUDROCORTISONE ACETATE 0.1 MG PO TABS
0.1000 mg | ORAL_TABLET | Freq: Every day | ORAL | 3 refills | Status: DC
Start: 2021-11-09 — End: 2022-08-09

## 2021-11-09 NOTE — Progress Notes (Signed)
? ?[facility] ? ?PATIENT: NEVAEH HOLLYFIELD ?DOB: [birth year] ? ?REFERRING DOCTOR OR PCP:   Dr. Venetia Maxon (PCP); Dr, Bettina Gavia (cardiology) ?SOURCE:  ? ? ?_________________________________ ? ? ?HISTORICAL ? ?CHIEF COMPLAINT:  ?Chief Complaint  ?Patient presents with  ? Follow-up  ?  Rm 1, w son Darnell Level. Here for 6 month f/u for L arm numbness. Pt reports doing much better. No sz. Taking meds first thing in the morning. No falls reported.   ? ? ?HISTORY OF PRESENT ILLNESS:  ?Harace Amory is a 88 y.o. man with episodes of arm numbness ? ?Update 11/09/2021 ?He is doing better and reports no recent episodes of numbness with altered awareness most likely due to hypotension. ? ?He is on proamatine and fludrocortisone for orthostatic hypotension.    BP is always lower in the mornings than later on in the day.    Typical readings in the morning might be 80/50.       He now takes his medications as soon as he wakes while in bed.    He also had increase his fludrocortisone dose but Dr. Bettina Gavia recommended returning back to 0.1 mg due to his history of congestive heart failure ? ?He has bilateral shoulder pain, much worse on the left than the right.  He feels range of motion is fairly normal in the right shoulder but limited due to pain in the left shoulder.  Put his arm behind his back and raising the arm and externally rotating a painful. ? ? ?History of episodes of numbness and transient alteration of awareness ?Mr. Asai has had frequent episodes of numbness in the left arm..   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   This episode lasted one day and then resolved.   He has had multiple spells, probably now occurring a couple times a week for 1 to 2 hours.  Most spells occur in the morning after breakfast and  he takes his am medications.  He is always sitting during the more recent spells.   He notes numbness but no weakness.  There were concerns that the episodes were  due to strokes or TIAs.  He is very claustophobic and has had only one good MRI in 2018.   It was actually fairly normal for age.  He has had a couple CT scans.   Scans are normal for age.  There were no acute changes.    He has never had GTC but seizures were also tossed out once as a possible explanation.     ? ?Spells always occur in the morning.  He gets out of bed at 7:15 a.m., he usually has some cereal while his wife is getting up.  He takes his medications around 8:00 a.m, concurrently with having breakfast with his wife and spells are usually 8:30 am.  For breakfast, he usually has cereal, pastry and orange juice.   Most episodes only involve numbness but one episode also was associated with some slurred speech and slobbering some.    Episodes are usually resolved by 10 AM and he has no further numbness. ? ?Imaging/procedures ?MRI of the head 03/16/2017 showed mild generalized cortical atrophy and some T2/flair hyperintense foci predominantly in  the periatrial deep white matter consistent with chronic microvascular ischemic change.  No acute findings. ? ?MRA of the head 03/16/2017 was essentially normal. ? ?CT scan of the head 11/09/2020 showed mild atrophy, periatrial white matter changes and and no  acute findings ? ?He has CAD and is s/p CABG.   He is on anticoag for A-Fib.   He sees Dr, Bettina Gavia ? ?He recently fell and broke his left hip requiring surgery.   He went to a SNF but is still only able to walk with strong support.   ? ?REVIEW OF SYSTEMS: ?Constitutional: No fevers, chills, sweats, or change in appetite ?Eyes: No visual changes, double vision, eye pain ?Ear, nose and throat: No hearing loss, ear pain, nasal congestion, sore throat ?Cardiovascular: No chest pain.  He has atrial fibrillation. ?Respiratory:  No shortness of breath at rest or with exertion.   No wheezes ?GastrointestinaI: No nausea, vomiting, diarrhea, abdominal pain, fecal incontinence ?Genitourinary:  No dysuria, urinary retention  or frequency.  No nocturia. ?Musculoskeletal:  No neck pain, back pain.  He has left hip pain after his fracture. ?Integumentary: No rash, pruritus, skin lesions ?Neurological: as above ?Psychiatric: No depression at this time.  No anxiety ?Endocrine: No palpitations, diaphoresis, change in appetite, change in weigh or increased thirst ?Hematologic/Lymphatic:  No anemia, purpura, petechiae. ?Allergic/Immunologic: No itchy/runny eyes, nasal congestion, recent allergic reactions, rashes ? ?ALLERGIES: ?Allergies  ?Allergen Reactions  ? Amiodarone Other (See Comments)  ?  Extreme dehydration  ? Diltiazem Other (See Comments)  ?  Pt unsure of reaction  ? Statins   ? Sulfa Antibiotics Other (See Comments)  ?  Pt unsure of reaction  ? ? ?HOME MEDICATIONS: ? ?Current Outpatient Medications:  ?  Acetaminophen (TYLENOL EXTRA STRENGTH PO), Take 1,000 mg by mouth 2 (two) times daily as needed (PAIN)., Disp: , Rfl:  ?  apixaban (ELIQUIS) 2.5 MG TABS tablet, Take 1 tablet (2.5 mg total) by mouth 2 (two) times daily., Disp: 180 tablet, Rfl: 3 ?  atorvastatin (LIPITOR) 20 MG tablet, Take 20 mg by mouth at bedtime., Disp: , Rfl:  ?  esomeprazole (NEXIUM) 20 MG capsule, Take 20 mg by mouth daily at 12 noon., Disp: , Rfl:  ?  metoprolol tartrate (LOPRESSOR) 25 MG tablet, Take 0.5 tablets (12.5 mg total) by mouth daily., Disp: 90 tablet, Rfl: 1 ?  midodrine (PROAMATINE) 10 MG tablet, TAKE 1 TABLET(10 MG) BY MOUTH FOUR TIMES DAILY (Patient taking differently: Take 10 mg by mouth 3 (three) times daily.), Disp: 360 tablet, Rfl: 0 ?  nitroGLYCERIN (NITROSTAT) 0.4 MG SL tablet, Place 1 tablet (0.4 mg total) under the tongue every 5 (five) minutes as needed for chest pain., Disp: 25 tablet, Rfl: 6 ?  oxyCODONE (OXY IR/ROXICODONE) 5 MG immediate release tablet, Take 5 mg by mouth every 8 (eight) hours as needed for moderate pain or severe pain., Disp: , Rfl:  ?  Polyvinyl Alcohol-Povidone (REFRESH OP), Place 1 drop into both eyes daily as  needed (DRY EYES)., Disp: , Rfl:  ?  PULMICORT FLEXHALER 180 MCG/ACT inhaler, Inhale 2 puffs into the lungs daily. , Disp: , Rfl:  ?  pyridostigmine (MESTINON) 60 MG tablet, 1/2 to 1 pill po tid, Disp: 90 tablet, Rfl: 5 ?  QUEtiapine (SEROQUEL) 25 MG tablet, Take 12.5 mg by mouth at bedtime., Disp: , Rfl:  ?  Sennosides (SENOKOT PO), Take 5 mg by mouth 2 (two) times daily as needed (CONSTIPATION)., Disp: , Rfl:  ?  fludrocortisone (FLORINEF) 0.1 MG tablet, Take 1 tablet (0.1 mg total) by mouth daily., Disp: 90 tablet, Rfl: 3 ? ?PAST MEDICAL HISTORY: ?Past Medical History:  ?Diagnosis Date  ? Arteriosclerosis of arterial coronary artery bypass graft   ? Arthritis   ?  Atrial contractions, premature   ? Atrial fibrillation ([place])   ? Benign hypertension   ? Bilateral carotid artery stenosis   ? CAD (coronary artery disease), autologous vein bypass graft 04/19/2013  ? 100% flush occlusion of SVG-OM & SVG-Diag; near Subtotal Occlusion of SVG-RCA (PCI of RCA done to avoid distal embolization with PCI to SVG)  ? CAD (coronary artery disease), native coronary artery 2007  ? Multivessel CAD referred for CABG  ? CAD S/P percutaneous coronary angioplasty  04/19/2013  ? PCI of Native RCA - Promus Premier DES 3.0 mm x 38 mm (3.6 mm);   ? Chronic anticoagulation 04/17/2018  ? Chronic combined systolic and diastolic heart failure ([place])   ? Coronary artery disease involving coronary bypass graft of native heart without angina pectoris; CAD (coronary artery disease) of artery bypass graft:  loss of VG to RCA and VG-Diag 04/17/2013  ? Dyslipidemia   ? Dyslipidemia, goal LDL below 70   ? Dyspnea 04/30/2013  ? Essential hypertension 05/02/2013  ? Fatigue 04/17/2013  ? GERD (gastroesophageal reflux disease)   ? Hematuria 05/02/2013  ? History of DVT of lower extremity   ? Right popliteal vein  ? History of stroke August 2011  ? Possible TIA in 2001;  Echo: EF 45-50%, mild HK of anterior septum.  ? History of transient cerebral ischemia   ?  Hx of orthostatic hypotension 2009  ? Positive tilt table:  dizziness/vasopressor type; previously on the decision   ? Hx SBO   ? Ischemic cardiomyopathy 02/06/2018  ? Nephrolithiasis   ? Orthostatic hypote

## 2021-11-10 ENCOUNTER — Ambulatory Visit (INDEPENDENT_AMBULATORY_CARE_PROVIDER_SITE_OTHER): Payer: Medicare Other | Admitting: Cardiology

## 2021-11-10 ENCOUNTER — Encounter: Payer: Self-pay | Admitting: Cardiology

## 2021-11-10 VITALS — BP 132/74 | HR 55 | Ht 74.0 in | Wt 179.0 lb

## 2021-11-10 DIAGNOSIS — Z7901 Long term (current) use of anticoagulants: Secondary | ICD-10-CM | POA: Diagnosis not present

## 2021-11-10 DIAGNOSIS — I110 Hypertensive heart disease with heart failure: Secondary | ICD-10-CM | POA: Diagnosis not present

## 2021-11-10 DIAGNOSIS — E785 Hyperlipidemia, unspecified: Secondary | ICD-10-CM | POA: Diagnosis not present

## 2021-11-10 DIAGNOSIS — I5042 Chronic combined systolic (congestive) and diastolic (congestive) heart failure: Secondary | ICD-10-CM | POA: Diagnosis not present

## 2021-11-10 DIAGNOSIS — I480 Paroxysmal atrial fibrillation: Secondary | ICD-10-CM

## 2021-11-10 DIAGNOSIS — I951 Orthostatic hypotension: Secondary | ICD-10-CM

## 2021-11-10 DIAGNOSIS — I25810 Atherosclerosis of coronary artery bypass graft(s) without angina pectoris: Secondary | ICD-10-CM

## 2021-11-10 NOTE — Patient Instructions (Signed)
Medication Instructions:  ?Your physician recommends that you continue on your current medications as directed. Please refer to the Current Medication list given to you today.  ?*If you need a refill on your cardiac medications before your next appointment, please call your pharmacy* ? ? ?Lab Work: ?None Ordered ?If you have labs (blood work) drawn today and your tests are completely normal, you will receive your results only by: ?MyChart Message (if you have MyChart) OR ?A paper copy in the mail ?If you have any lab test that is abnormal or we need to change your treatment, we will call you to review the results. ? ? ?Testing/Procedures: ?None Ordered ? ? ?Follow-Up: ?At [facility], you and your health needs are our priority.  As part of our continuing mission to provide you with exceptional heart care, we have created designated Provider Care Teams.  These Care Teams include your primary Cardiologist (physician) and Advanced Practice Providers (APPs -  Physician Assistants and Nurse Practitioners) who all work together to provide you with the care you need, when you need it. ? ?We recommend signing up for the patient portal called "MyChart".  Sign up information is provided on this After Visit Summary.  MyChart is used to connect with patients for Virtual Visits (Telemedicine).  Patients are able to view lab/test results, encounter notes, upcoming appointments, etc.  Non-urgent messages can be sent to your provider as well.   ?To learn more about what you can do with MyChart, go to [email or web address].   ? ?Your next appointment:   ?6 month(s) ? ?The format for your next appointment:   ?In Person ? ?Provider:   ?Norman Herrlich, MD ? ? ?Other Instructions ?Drink 16 ounces of Water before getting up in the morning ?

## 2021-11-10 NOTE — Progress Notes (Signed)
?Cardiology Office Note:   ? ?Date:  11/10/2021  ? ?ID:  Joshua Chambers, DOB 1922-09-15, MRN [id number] ? ?PCP:  Street, Sharon Mt, MD  ?Cardiologist:  Shirlee More, MD   ? ?Referring MD: Street, Sharon Mt, *  ? ? ?ASSESSMENT:   ? ?1. Orthostatic hypotension   ?2. Paroxysmal atrial fibrillation (HCC)   ?3. Chronic anticoagulation   ?4. Hypertensive heart disease with chronic combined systolic and diastolic congestive heart failure ([place])   ?5. Coronary artery disease involving coronary bypass graft of native heart without angina pectoris; CAD (coronary artery disease) of artery bypass graft:  loss of VG to RCA and VG-Diag   ?6. Dyslipidemia, goal LDL below 70   ? ?PLAN:   ? ?In order of problems listed above: ? ?Fortunately with a combination of monitoring and Florinef will be able to blunt his symptomatic hypotension avoid episodes at home continue the same ?Stable asymptomatic we will continue his beta-blocker along with reduced dose anticoagulant has had no recurrent bleeding ?Heart failure is nicely compensated currently not on a loop diuretic ?Stable CAD following remote CABG having no anginal discomfort continue current medical treatment including beta-blocker statin nitroglycerin as needed ?Continue his high intensity statin ? ? ?Next appointment: 9 months ? ? ?Medication Adjustments/Labs and Tests Ordered: ?Current medicines are reviewed at length with the patient today.  Concerns regarding medicines are outlined above.  ?No orders of the defined types were placed in this encounter. ? ?No orders of the defined types were placed in this encounter. ? ? ?Chief Complaint  ?Patient presents with  ? Follow-up  ? Hypotension  ? ? ?History of Present Illness:   ? ?Joshua Chambers is a 88 y.o. male with a hx of quite complex heart disease including CAD remote CABG 2007 hypertensive heart disease symptomatic orthostatic hypotension requiring midodrine therapy paroxysmal atrial fibrillation with TIA and recurrent GI  bleeding that has required transfusion  last seen on 08/05/2021 with ongoing symptomatic hypotension treated with high-dose midodrine and was restarted utilizing abdominal binder and beta-blocker dose decreased. ?He was seen in [facility] 06/08/2021 admitted for 24 hours and presented with symptomatic hypotension hemoglobin mildly diminished 10.8 creatinine 0.5 sodium 139 potassium 4.0 echocardiogram was performed EF 45 to 50% right ventricle mildly enlarged normal function he had mild aortic and mitral regurgitation.  I reviewed the EKG performed 06/09/2021 independently sinus rhythm nonspecific ST abnormality had a CT of the chest showing his ascending aorta 44 mm enlarged.  ? ?Compliance with diet, lifestyle and medications: Yes ?He is seen along with his son-in-law who participates in evaluation decision made ?He has had no recurrent symptomatic hypotension ?He tolerates reduced dose anticoagulant without bleeding ?He tolerates his high intensity statin. ?He is very limited he is not having chest pain edema shortness of breath palpitation or syncope ?He has followed with neurology and had an injection of his left shoulder remarkably effective in relieving chronic pain ? ?Recent labs 07/15/2021 cholesterol 136 LDL 96 A1c 5.5 hemoglobin 10.5 creatinine 0.70 ?Past Medical History:  ?Diagnosis Date  ? Arteriosclerosis of arterial coronary artery bypass graft   ? Arthritis   ? Atrial contractions, premature   ? Atrial fibrillation ([place])   ? Benign hypertension   ? Bilateral carotid artery stenosis   ? CAD (coronary artery disease), autologous vein bypass graft 04/19/2013  ? 100% flush occlusion of SVG-OM & SVG-Diag; near Subtotal Occlusion of SVG-RCA (PCI of RCA done to avoid distal embolization with PCI to SVG)  ?  CAD (coronary artery disease), native coronary artery 2007  ? Multivessel CAD referred for CABG  ? CAD S/P percutaneous coronary angioplasty  04/19/2013  ? PCI of Native RCA - Promus Premier DES 3.0 mm  x 38 mm (3.6 mm);   ? Chronic anticoagulation 04/17/2018  ? Chronic combined systolic and diastolic heart failure ([place])   ? Coronary artery disease involving coronary bypass graft of native heart without angina pectoris; CAD (coronary artery disease) of artery bypass graft:  loss of VG to RCA and VG-Diag 04/17/2013  ? Dyslipidemia   ? Dyslipidemia, goal LDL below 70   ? Dyspnea 04/30/2013  ? Essential hypertension 05/02/2013  ? Fatigue 04/17/2013  ? GERD (gastroesophageal reflux disease)   ? Hematuria 05/02/2013  ? History of DVT of lower extremity   ? Right popliteal vein  ? History of stroke August 2011  ? Possible TIA in 2001;  Echo: EF 45-50%, mild HK of anterior septum.  ? History of transient cerebral ischemia   ? Hx of orthostatic hypotension 2009  ? Positive tilt table:  dizziness/vasopressor type; previously on the decision   ? Hx SBO   ? Ischemic cardiomyopathy 02/06/2018  ? Nephrolithiasis   ? Orthostatic hypotension   ? Other and unspecified coagulation defects 04/17/2013  ? PAF (paroxysmal atrial fibrillation) ([place]) 02/06/2018  ? Premature atrial contractions 07/28/2015  ? Preoperative examination, unspecified 04/17/2013  ? S/P CABG x 3 2007  ? LIMA-LAD, SVG-DIAG, SVG-RCA  ? S/P CABG x 4; LIMA-LAD, SVG-RPDA, SVG-OM, SVG-D1 -- all but LIMA-LAD occluded 03/18/2006  ? 03/2006 - LIMA-LAD, SVG-D1, SVG-RPDA   ? Sinus arrhythmia 09/09/2016  ? Stroke [facility])   ? TIA (transient ischemic attack) 03/16/2017  ? Unsteady gait   ? ? ?Past Surgical History:  ?Procedure Laterality Date  ? APPENDECTOMY    ? CARDIAC SURGERY  2000s  ? COLON SURGERY    ? CORONARY ANGIOPLASTY WITH STENT PLACEMENT  04/19/13  ? Stent-Promus DES (3.0 mm x 38 mm - 3.6 mm) to native RCA prox-mid,  ? CORONARY ARTERY BYPASS GRAFT  2007  ? LIMA-LAD, SVG-D1, SVG-RCA  ? LEFT HEART CATHETERIZATION WITH CORONARY/GRAFT ANGIOGRAM Left 04/19/2013  ? LEFT HEART CATH W/ CORONARY/GRAFT ANGIOGRAM;  Leonie Man, MD;  Location: [facility] CATH LAB;  LM 20%-->LAD 70-80% then subTO  after D1 (Patent LIMA-LAD); Small non-dom Cx ~20% ostial.  RCA long 60-70% prox --> 90% mid.  SVG-OM & SVG-Diag 100%, diffuse Subtotal occlusion of SVG-rPD  ? STENT PLACEMENT VASCULAR ([facility] HX)    ? TONSILLECTOMY    ? ? ?Current Medications: ?Current Meds  ?Medication Sig  ? Acetaminophen (TYLENOL EXTRA STRENGTH PO) Take 1,000 mg by mouth 2 (two) times daily as needed (PAIN).  ? apixaban (ELIQUIS) 2.5 MG TABS tablet Take 1 tablet (2.5 mg total) by mouth 2 (two) times daily.  ? atorvastatin (LIPITOR) 20 MG tablet Take 20 mg by mouth at bedtime.  ? esomeprazole (NEXIUM) 20 MG capsule Take 20 mg by mouth daily at 12 noon.  ? fludrocortisone (FLORINEF) 0.1 MG tablet Take 1 tablet (0.1 mg total) by mouth daily.  ? metoprolol tartrate (LOPRESSOR) 25 MG tablet Take 0.5 tablets (12.5 mg total) by mouth daily.  ? midodrine (PROAMATINE) 10 MG tablet TAKE 1 TABLET(10 MG) BY MOUTH FOUR TIMES DAILY (Patient taking differently: Take 10 mg by mouth 3 (three) times daily.)  ? nitroGLYCERIN (NITROSTAT) 0.4 MG SL tablet Place 1 tablet (0.4 mg total) under the tongue every 5 (five) minutes as needed  for chest pain.  ? oxyCODONE (OXY IR/ROXICODONE) 5 MG immediate release tablet Take 5 mg by mouth every 8 (eight) hours as needed for moderate pain or severe pain.  ? Polyvinyl Alcohol-Povidone (REFRESH OP) Place 1 drop into both eyes daily as needed (DRY EYES).  ? PULMICORT FLEXHALER 180 MCG/ACT inhaler Inhale 2 puffs into the lungs daily.   ? pyridostigmine (MESTINON) 60 MG tablet 1/2 to 1 pill po tid (Patient taking differently: Take 30 mg by mouth 3 (three) times daily. 1/2 to 1 pill po tid)  ? QUEtiapine (SEROQUEL) 25 MG tablet Take 12.5 mg by mouth at bedtime.  ? Sennosides (SENOKOT PO) Take 5 mg by mouth 2 (two) times daily as needed (CONSTIPATION).  ?  ? ?Allergies:   Amiodarone, Diltiazem, Statins, and Sulfa antibiotics  ? ?Social History  ? ?Socioeconomic History  ? Marital status: Married  ?  Spouse name: Not on file  ? Number  of children: Not on file  ? Years of education: Not on file  ? Highest education level: Not on file  ?Occupational History  ? Not on file  ?Tobacco Use  ? Smoking status: Never  ?  Passive exposure: N

## 2021-11-11 ENCOUNTER — Other Ambulatory Visit: Payer: Self-pay

## 2022-03-13 ENCOUNTER — Other Ambulatory Visit: Payer: Self-pay | Admitting: Cardiology

## 2022-03-14 NOTE — Telephone Encounter (Signed)
Prescription refill request for Eliquis received. Indication: PAF Last office visit: 11/10/21  Caryl Pina MD Scr: 0.70 on 07/15/21 Age: 88 Weight: 81.2kg  Pt is on Eliquis 2.5mg  twice daily.  Continue low dose Eliquis per Dr Hulen Shouts note.  Refill approved.

## 2022-05-03 DIAGNOSIS — Z23 Encounter for immunization: Secondary | ICD-10-CM | POA: Diagnosis not present

## 2022-06-15 DIAGNOSIS — H6123 Impacted cerumen, bilateral: Secondary | ICD-10-CM | POA: Diagnosis not present

## 2022-06-17 ENCOUNTER — Other Ambulatory Visit: Payer: Self-pay | Admitting: Cardiology

## 2022-06-17 NOTE — Telephone Encounter (Signed)
Prescription refill request for Eliquis received. Indication:afib Last office visit:4/23 Scr:0.7 Age: 88 Weight:81.2  kg  Prescription refilled

## 2022-07-26 DIAGNOSIS — D508 Other iron deficiency anemias: Secondary | ICD-10-CM | POA: Diagnosis not present

## 2022-07-26 DIAGNOSIS — Z0000 Encounter for general adult medical examination without abnormal findings: Secondary | ICD-10-CM | POA: Diagnosis not present

## 2022-07-26 DIAGNOSIS — Z79899 Other long term (current) drug therapy: Secondary | ICD-10-CM | POA: Diagnosis not present

## 2022-07-26 DIAGNOSIS — I672 Cerebral atherosclerosis: Secondary | ICD-10-CM | POA: Diagnosis not present

## 2022-07-26 DIAGNOSIS — M6281 Muscle weakness (generalized): Secondary | ICD-10-CM | POA: Diagnosis not present

## 2022-07-26 DIAGNOSIS — R2681 Unsteadiness on feet: Secondary | ICD-10-CM | POA: Diagnosis not present

## 2022-07-26 DIAGNOSIS — R7302 Impaired glucose tolerance (oral): Secondary | ICD-10-CM | POA: Diagnosis not present

## 2022-07-26 DIAGNOSIS — E785 Hyperlipidemia, unspecified: Secondary | ICD-10-CM | POA: Diagnosis not present

## 2022-07-26 DIAGNOSIS — I951 Orthostatic hypotension: Secondary | ICD-10-CM | POA: Diagnosis not present

## 2022-08-02 NOTE — Progress Notes (Signed)
Cardiology Office Note:    Date:  08/03/2022   ID:  Joshua, Chambers 1923/01/02, MRN [id number]  PCP:  Street, Joshua Coup, MD  Cardiologist:  Joshua Herrlich, MD    Referring MD: [address] Joshua Chambers, *    ASSESSMENT:    1. Orthostatic hypotension   2. Paroxysmal atrial fibrillation (HCC)   3. Chronic anticoagulation   4. Coronary artery disease involving coronary bypass graft of native heart without angina pectoris; CAD (coronary artery disease) of artery bypass graft:  loss of VG to RCA and VG-Diag   5. Hypertensive heart disease with chronic combined systolic and diastolic congestive heart failure (HCC)   6. Dyslipidemia, goal LDL below 70   7. Ascending aorta enlargement (HCC)    PLAN:    In order of problems listed above:  At this stage in his life [name] is doing quite well with high-dose midodrine and Florinef without findings of heart failure he has had no further syncopal episodes or falls.  Continue the same Maintaining sinus rhythm continue his anticoagulant reduced dose Stable CAD following bypass surgery having no anginal discomfort and he is on a minimal dose of beta-blocker and statin Stable no fluid overload currently not on a loop diuretic Continue his statin At his age I would stop screening CTs think there is any indication for intervention on serial imaging   Next appointment: 6 months   Medication Adjustments/Labs and Tests Ordered: Current medicines are reviewed at length with the patient today.  Concerns regarding medicines are outlined above.  Orders Placed This Encounter  Procedures   EKG 12-Lead   No orders of the defined types were placed in this encounter.   Chief Complaint  Patient presents with   Follow-up   Coronary Artery Disease   Hypotension    History of Present Illness:    Joshua Chambers is a 89 y.o. male with a hx of quite complex heart disease including CAD remote CABG 2007 hypertensive heart disease symptomatic  orthostatic hypotension requiring midodrine therapy paroxysmal atrial fibrillation with TIA and recurrent GI bleeding last seen 11/10/2021.  He was seen in [facility] 06/08/2021 admitted for 24 hours and presented with symptomatic hypotension hemoglobin mildly diminished 10.8 creatinine 0.5 sodium 139 potassium 4.0 echocardiogram was performed EF 45 to 50% right ventricle mildly enlarged normal function he had mild aortic and mitral regurgitation.  I reviewed the EKG performed 06/09/2021 independently sinus rhythm nonspecific ST abnormality had a CT of the chest showing his ascending aorta 44 mm enlarged.   Compliance with diet, lifestyle and medications: Yes  His son is here with his father is very nicely supervised at home Fortunately there have been no falls or hypotensive episodes He is limited by joint pain gait limitations and uses a wheelchair He has had no cardiovascular symptoms of edema shortness of breath chest pain palpitation or syncope Tolerates his anticoagulant without bleeding and has had no recurrent TIA Recent labs 07/15/2021 cholesterol 136 LDL 96 A1c 5.5 hemoglobin 40.9 creatinine 0.7 potassium 3.9. His sinuses.  Repeated in the last 6 months Past Medical History:  Diagnosis Date   Arteriosclerosis of arterial coronary artery bypass graft    Arthritis    Atrial contractions, premature    Atrial fibrillation (HCC)    Benign hypertension    Bilateral carotid artery stenosis    CAD (coronary artery disease), autologous vein bypass graft 04/19/2013   100% flush occlusion of SVG-OM & SVG-Diag; near Subtotal Occlusion of SVG-RCA (PCI of  RCA done to avoid distal embolization with PCI to SVG)   CAD (coronary artery disease), native coronary artery 2007   Multivessel CAD referred for CABG   CAD S/P percutaneous coronary angioplasty  04/19/2013   PCI of Native RCA - Promus Premier DES 3.0 mm x 38 mm (3.6 mm);    Chronic anticoagulation 04/17/2018   Chronic combined systolic  and diastolic heart failure (HCC)    Coronary artery disease involving coronary bypass graft of native heart without angina pectoris; CAD (coronary artery disease) of artery bypass graft:  loss of VG to RCA and VG-Diag 04/17/2013   Dyslipidemia    Dyslipidemia, goal LDL below 70    Dyspnea 04/30/2013   Essential hypertension 05/02/2013   Fatigue 04/17/2013   GERD (gastroesophageal reflux disease)    Hematuria 05/02/2013   History of DVT of lower extremity    Right popliteal vein   History of stroke August 2011   Possible TIA in 2001;  Echo: EF 45-50%, mild HK of anterior septum.   History of transient cerebral ischemia    Hx of orthostatic hypotension 2009   Positive tilt table:  dizziness/vasopressor type; previously on the decision    Hx SBO    Ischemic cardiomyopathy 02/06/2018   Nephrolithiasis    Orthostatic hypotension    Other and unspecified coagulation defects 04/17/2013   PAF (paroxysmal atrial fibrillation) (HCC) 02/06/2018   Premature atrial contractions 07/28/2015   Preoperative examination, unspecified 04/17/2013   S/P CABG x 3 2007   LIMA-LAD, SVG-DIAG, SVG-RCA   S/P CABG x 4; LIMA-LAD, SVG-RPDA, SVG-OM, SVG-D1 -- all but LIMA-LAD occluded 03/18/2006   03/2006 - LIMA-LAD, SVG-D1, SVG-RPDA    Sinus arrhythmia 09/09/2016   Stroke [facility])    TIA (transient ischemic attack) 03/16/2017   Unsteady gait     Past Surgical History:  Procedure Laterality Date   APPENDECTOMY     CARDIAC SURGERY  2000s   COLON SURGERY     CORONARY ANGIOPLASTY WITH STENT PLACEMENT  04/19/13   Stent-Promus DES (3.0 mm x 38 mm - 3.6 mm) to native RCA prox-mid,   CORONARY ARTERY BYPASS GRAFT  2007   LIMA-LAD, SVG-D1, SVG-RCA   LEFT HEART CATHETERIZATION WITH CORONARY/GRAFT ANGIOGRAM Left 04/19/2013   LEFT HEART CATH W/ CORONARY/GRAFT ANGIOGRAM;  [name], MD;  Location: [facility] CATH LAB;  LM 20%-->LAD 70-80% then subTO after D1 (Patent LIMA-LAD); Small non-dom Cx ~20% ostial.  RCA long 60-70% prox -->  90% mid.  SVG-OM & SVG-Diag 100%, diffuse Subtotal occlusion of SVG-rPD   STENT PLACEMENT VASCULAR ([facility] HX)     TONSILLECTOMY      Current Medications: Current Meds  Medication Sig   Acetaminophen (TYLENOL EXTRA STRENGTH PO) Take 1,000 mg by mouth 2 (two) times daily as needed (PAIN).   atorvastatin (LIPITOR) 20 MG tablet Take 20 mg by mouth at bedtime.   ELIQUIS 2.5 MG TABS tablet TAKE 1 TABLET(2.5 MG) BY MOUTH TWICE DAILY   esomeprazole (NEXIUM) 20 MG capsule Take 20 mg by mouth daily at 12 noon.   fludrocortisone (FLORINEF) 0.1 MG tablet Take 1 tablet (0.1 mg total) by mouth daily.   metoprolol tartrate (LOPRESSOR) 25 MG tablet Take 0.5 tablets (12.5 mg total) by mouth daily.   midodrine (PROAMATINE) 10 MG tablet TAKE 1 TABLET(10 MG) BY MOUTH FOUR TIMES DAILY (Patient taking differently: Take 10 mg by mouth 3 (three) times daily.)   nitroGLYCERIN (NITROSTAT) 0.4 MG SL tablet Place 1 tablet (0.4 mg total) under the  tongue every 5 (five) minutes as needed for chest pain.   oxyCODONE (OXY IR/ROXICODONE) 5 MG immediate release tablet Take 5 mg by mouth every 8 (eight) hours as needed for moderate pain or severe pain.   Polyvinyl Alcohol-Povidone (REFRESH OP) Place 1 drop into both eyes daily as needed (DRY EYES).   PULMICORT FLEXHALER 180 MCG/ACT inhaler Inhale 2 puffs into the lungs daily.    pyridostigmine (MESTINON) 60 MG tablet 1/2 to 1 pill po tid (Patient taking differently: Take 30 mg by mouth 3 (three) times daily. 1/2 to 1 pill po tid)   QUEtiapine (SEROQUEL) 25 MG tablet Take 12.5 mg by mouth at bedtime.   Sennosides (SENOKOT PO) Take 5 mg by mouth 2 (two) times daily as needed (CONSTIPATION).     Allergies:   Amiodarone, Diltiazem, Statins, and Sulfa antibiotics   Social History   Socioeconomic History   Marital status: Married    Spouse name: Not on file   Number of children: Not on file   Years of education: Not on file   Highest education level: Not on file   Occupational History   Not on file  Tobacco Use   Smoking status: Never    Passive exposure: Never   Smokeless tobacco: Never  Vaping Use   Vaping Use: Never used  Substance and Sexual Activity   Alcohol use: Yes    Comment: beer w/ pizza rarely   Drug use: No   Sexual activity: Not on file  Other Topics Concern   Not on file  Social History Narrative    He is now remarried for the last 2 years, his first wife of 66 years is deceased. He has 1child from that first marriage and 2 grandchildren. He does not smoke, does not drink. He is not very active but he does kind of meander around with his new wife who is permanently disabled.    Right handed   Caffeine use: coffee/soda daily   Social Determinants of Health   Financial Resource Strain: Not on file  Food Insecurity: Not on file  Transportation Needs: Not on file  Physical Activity: Not on file  Stress: Not on file  Social Connections: Not on file     Family History: The patient's family history includes Colon cancer in his father; Hypertension in his son. ROS:   Please see the history of present illness.    All other systems reviewed and are negative.  EKGs/Labs/Other Studies Reviewed:    The following studies were reviewed today:  EKG:  EKG ordered today and personally reviewed.  The ekg ordered today demonstrates sinus rhythm 60 bpm 1 APC minor nonspecific ST normality once normal EKG     Physical Exam:    VS:  BP 139/67 (BP Location: Right Arm, Patient Position: Sitting)   Pulse 60   Ht 6\' 2"  (1.88 m)   SpO2 98%   BMI 22.98 kg/m     Wt Readings from Last 3 Encounters:  11/10/21 179 lb (81.2 kg)  12/29/20 189 lb (85.7 kg)  10/07/20 204 lb 3.2 oz (92.6 kg)     GEN: He appears his age well nourished, well developed in no acute distress HEENT: Normal NECK: No JVD; No carotid bruits LYMPHATICS: No lymphadenopathy CARDIAC: RRR, no murmurs, rubs, gallops RESPIRATORY:  Clear to auscultation without  rales, wheezing or rhonchi  ABDOMEN: Soft, non-tender, non-distended MUSCULOSKELETAL:  No edema; No deformity  SKIN: Warm and dry NEUROLOGIC:  Alert and oriented x 3 PSYCHIATRIC:  Normal affect    Signed, Shirlee More, MD  08/03/2022 12:00 PM    [place]

## 2022-08-03 ENCOUNTER — Ambulatory Visit: Payer: Medicare Other | Attending: Cardiology | Admitting: Cardiology

## 2022-08-03 ENCOUNTER — Encounter: Payer: Self-pay | Admitting: Cardiology

## 2022-08-03 VITALS — BP 139/67 | HR 60 | Ht 74.0 in

## 2022-08-03 DIAGNOSIS — Z7901 Long term (current) use of anticoagulants: Secondary | ICD-10-CM | POA: Diagnosis not present

## 2022-08-03 DIAGNOSIS — I110 Hypertensive heart disease with heart failure: Secondary | ICD-10-CM | POA: Diagnosis not present

## 2022-08-03 DIAGNOSIS — I951 Orthostatic hypotension: Secondary | ICD-10-CM | POA: Diagnosis not present

## 2022-08-03 DIAGNOSIS — I5042 Chronic combined systolic (congestive) and diastolic (congestive) heart failure: Secondary | ICD-10-CM | POA: Insufficient documentation

## 2022-08-03 DIAGNOSIS — I25810 Atherosclerosis of coronary artery bypass graft(s) without angina pectoris: Secondary | ICD-10-CM | POA: Diagnosis not present

## 2022-08-03 DIAGNOSIS — I7789 Other specified disorders of arteries and arterioles: Secondary | ICD-10-CM | POA: Diagnosis not present

## 2022-08-03 DIAGNOSIS — I480 Paroxysmal atrial fibrillation: Secondary | ICD-10-CM

## 2022-08-03 DIAGNOSIS — E785 Hyperlipidemia, unspecified: Secondary | ICD-10-CM

## 2022-08-03 NOTE — Patient Instructions (Signed)
Medication Instructions:  Your physician recommends that you continue on your current medications as directed. Please refer to the Current Medication list given to you today.  *If you need a refill on your cardiac medications before your next appointment, please call your pharmacy*   Lab Work: None If you have labs (blood work) drawn today and your tests are completely normal, you will receive your results only by: MyChart Message (if you have MyChart) OR A paper copy in the mail If you have any lab test that is abnormal or we need to change your treatment, we will call you to review the results.   Testing/Procedures: None   Follow-Up: At [facility], you and your health needs are our priority.  As part of our continuing mission to provide you with exceptional heart care, we have created designated Provider Care Teams.  These Care Teams include your primary Cardiologist (physician) and Advanced Practice Providers (APPs -  Physician Assistants and Nurse Practitioners) who all work together to provide you with the care you need, when you need it.  We recommend signing up for the patient portal called "MyChart".  Sign up information is provided on this After Visit Summary.  MyChart is used to connect with patients for Virtual Visits (Telemedicine).  Patients are able to view lab/test results, encounter notes, upcoming appointments, etc.  Non-urgent messages can be sent to your provider as well.   To learn more about what you can do with MyChart, go to [email or web address].    Your next appointment:   6 month(s)  Provider:   Brian Munley, MD    Other Instructions None  

## 2022-08-09 ENCOUNTER — Other Ambulatory Visit: Payer: Self-pay | Admitting: Neurology

## 2022-08-30 DIAGNOSIS — H353131 Nonexudative age-related macular degeneration, bilateral, early dry stage: Secondary | ICD-10-CM | POA: Diagnosis not present

## 2022-08-30 DIAGNOSIS — H35363 Drusen (degenerative) of macula, bilateral: Secondary | ICD-10-CM | POA: Diagnosis not present

## 2022-08-30 DIAGNOSIS — H52223 Regular astigmatism, bilateral: Secondary | ICD-10-CM | POA: Diagnosis not present

## 2022-08-30 DIAGNOSIS — H5231 Anisometropia: Secondary | ICD-10-CM | POA: Diagnosis not present

## 2022-08-30 DIAGNOSIS — Z9849 Cataract extraction status, unspecified eye: Secondary | ICD-10-CM | POA: Diagnosis not present

## 2022-08-30 DIAGNOSIS — H524 Presbyopia: Secondary | ICD-10-CM | POA: Diagnosis not present

## 2022-08-30 DIAGNOSIS — Z961 Presence of intraocular lens: Secondary | ICD-10-CM | POA: Diagnosis not present

## 2022-11-15 ENCOUNTER — Ambulatory Visit: Payer: Medicare Other | Admitting: Neurology

## 2022-11-15 DIAGNOSIS — D492 Neoplasm of unspecified behavior of bone, soft tissue, and skin: Secondary | ICD-10-CM | POA: Diagnosis not present

## 2022-11-15 DIAGNOSIS — N390 Urinary tract infection, site not specified: Secondary | ICD-10-CM | POA: Diagnosis not present

## 2022-11-15 DIAGNOSIS — K2960 Other gastritis without bleeding: Secondary | ICD-10-CM | POA: Diagnosis not present

## 2022-11-15 DIAGNOSIS — J392 Other diseases of pharynx: Secondary | ICD-10-CM | POA: Diagnosis not present

## 2022-11-19 DIAGNOSIS — D500 Iron deficiency anemia secondary to blood loss (chronic): Secondary | ICD-10-CM | POA: Diagnosis not present

## 2022-11-19 DIAGNOSIS — I998 Other disorder of circulatory system: Secondary | ICD-10-CM | POA: Diagnosis not present

## 2022-11-19 DIAGNOSIS — N400 Enlarged prostate without lower urinary tract symptoms: Secondary | ICD-10-CM | POA: Diagnosis not present

## 2022-11-19 DIAGNOSIS — Z882 Allergy status to sulfonamides status: Secondary | ICD-10-CM | POA: Diagnosis not present

## 2022-11-19 DIAGNOSIS — Z7901 Long term (current) use of anticoagulants: Secondary | ICD-10-CM | POA: Diagnosis not present

## 2022-11-19 DIAGNOSIS — I10 Essential (primary) hypertension: Secondary | ICD-10-CM | POA: Diagnosis not present

## 2022-11-19 DIAGNOSIS — I2510 Atherosclerotic heart disease of native coronary artery without angina pectoris: Secondary | ICD-10-CM | POA: Diagnosis not present

## 2022-11-19 DIAGNOSIS — E7800 Pure hypercholesterolemia, unspecified: Secondary | ICD-10-CM | POA: Diagnosis not present

## 2022-11-19 DIAGNOSIS — E538 Deficiency of other specified B group vitamins: Secondary | ICD-10-CM | POA: Diagnosis not present

## 2022-11-19 DIAGNOSIS — Z87440 Personal history of urinary (tract) infections: Secondary | ICD-10-CM | POA: Diagnosis not present

## 2022-11-19 DIAGNOSIS — E539 Vitamin B deficiency, unspecified: Secondary | ICD-10-CM | POA: Diagnosis not present

## 2022-11-19 DIAGNOSIS — R310 Gross hematuria: Secondary | ICD-10-CM | POA: Diagnosis not present

## 2022-11-19 DIAGNOSIS — R031 Nonspecific low blood-pressure reading: Secondary | ICD-10-CM | POA: Diagnosis not present

## 2022-11-19 DIAGNOSIS — I4891 Unspecified atrial fibrillation: Secondary | ICD-10-CM | POA: Diagnosis not present

## 2022-11-19 DIAGNOSIS — K5730 Diverticulosis of large intestine without perforation or abscess without bleeding: Secondary | ICD-10-CM | POA: Diagnosis not present

## 2022-11-19 DIAGNOSIS — D509 Iron deficiency anemia, unspecified: Secondary | ICD-10-CM | POA: Diagnosis not present

## 2022-11-19 DIAGNOSIS — R3915 Urgency of urination: Secondary | ICD-10-CM | POA: Diagnosis not present

## 2022-11-19 DIAGNOSIS — F0390 Unspecified dementia without behavioral disturbance: Secondary | ICD-10-CM | POA: Diagnosis not present

## 2022-11-19 DIAGNOSIS — Z951 Presence of aortocoronary bypass graft: Secondary | ICD-10-CM | POA: Diagnosis not present

## 2022-11-19 DIAGNOSIS — N401 Enlarged prostate with lower urinary tract symptoms: Secondary | ICD-10-CM | POA: Diagnosis not present

## 2022-11-19 DIAGNOSIS — N3943 Post-void dribbling: Secondary | ICD-10-CM | POA: Diagnosis not present

## 2022-11-19 DIAGNOSIS — N3001 Acute cystitis with hematuria: Secondary | ICD-10-CM | POA: Diagnosis not present

## 2022-11-19 DIAGNOSIS — Z79899 Other long term (current) drug therapy: Secondary | ICD-10-CM | POA: Diagnosis not present

## 2022-11-19 DIAGNOSIS — M1990 Unspecified osteoarthritis, unspecified site: Secondary | ICD-10-CM | POA: Diagnosis not present

## 2022-11-19 DIAGNOSIS — K219 Gastro-esophageal reflux disease without esophagitis: Secondary | ICD-10-CM | POA: Diagnosis not present

## 2022-11-19 DIAGNOSIS — Z888 Allergy status to other drugs, medicaments and biological substances status: Secondary | ICD-10-CM | POA: Diagnosis not present

## 2022-11-19 DIAGNOSIS — R319 Hematuria, unspecified: Secondary | ICD-10-CM | POA: Diagnosis not present

## 2022-11-19 DIAGNOSIS — I959 Hypotension, unspecified: Secondary | ICD-10-CM | POA: Diagnosis not present

## 2022-11-19 DIAGNOSIS — R531 Weakness: Secondary | ICD-10-CM | POA: Diagnosis not present

## 2022-11-19 DIAGNOSIS — I252 Old myocardial infarction: Secondary | ICD-10-CM | POA: Diagnosis not present

## 2022-11-19 DIAGNOSIS — Z8673 Personal history of transient ischemic attack (TIA), and cerebral infarction without residual deficits: Secondary | ICD-10-CM | POA: Diagnosis not present

## 2022-11-19 DIAGNOSIS — Z87442 Personal history of urinary calculi: Secondary | ICD-10-CM | POA: Diagnosis not present

## 2022-11-19 DIAGNOSIS — Z792 Long term (current) use of antibiotics: Secondary | ICD-10-CM | POA: Diagnosis not present

## 2022-11-21 DIAGNOSIS — R031 Nonspecific low blood-pressure reading: Secondary | ICD-10-CM | POA: Diagnosis not present

## 2022-11-21 DIAGNOSIS — I10 Essential (primary) hypertension: Secondary | ICD-10-CM | POA: Diagnosis not present

## 2022-11-21 DIAGNOSIS — I4891 Unspecified atrial fibrillation: Secondary | ICD-10-CM | POA: Diagnosis not present

## 2022-11-21 DIAGNOSIS — Z7901 Long term (current) use of anticoagulants: Secondary | ICD-10-CM | POA: Diagnosis not present

## 2022-11-21 DIAGNOSIS — E539 Vitamin B deficiency, unspecified: Secondary | ICD-10-CM | POA: Diagnosis not present

## 2022-11-21 DIAGNOSIS — D509 Iron deficiency anemia, unspecified: Secondary | ICD-10-CM | POA: Diagnosis not present

## 2022-11-21 DIAGNOSIS — N400 Enlarged prostate without lower urinary tract symptoms: Secondary | ICD-10-CM | POA: Diagnosis not present

## 2022-11-21 DIAGNOSIS — Z79899 Other long term (current) drug therapy: Secondary | ICD-10-CM | POA: Diagnosis not present

## 2022-11-21 DIAGNOSIS — F0390 Unspecified dementia without behavioral disturbance: Secondary | ICD-10-CM | POA: Diagnosis not present

## 2022-11-21 DIAGNOSIS — I998 Other disorder of circulatory system: Secondary | ICD-10-CM | POA: Diagnosis not present

## 2022-11-23 DIAGNOSIS — I959 Hypotension, unspecified: Secondary | ICD-10-CM | POA: Diagnosis not present

## 2022-11-23 DIAGNOSIS — I998 Other disorder of circulatory system: Secondary | ICD-10-CM | POA: Diagnosis not present

## 2022-11-23 DIAGNOSIS — I950 Idiopathic hypotension: Secondary | ICD-10-CM | POA: Diagnosis not present

## 2022-11-23 DIAGNOSIS — I4891 Unspecified atrial fibrillation: Secondary | ICD-10-CM | POA: Diagnosis not present

## 2022-11-23 DIAGNOSIS — R279 Unspecified lack of coordination: Secondary | ICD-10-CM | POA: Diagnosis not present

## 2022-11-23 DIAGNOSIS — Z7401 Bed confinement status: Secondary | ICD-10-CM | POA: Diagnosis not present

## 2022-11-23 DIAGNOSIS — R531 Weakness: Secondary | ICD-10-CM | POA: Diagnosis not present

## 2022-11-24 DIAGNOSIS — I480 Paroxysmal atrial fibrillation: Secondary | ICD-10-CM | POA: Diagnosis not present

## 2022-11-24 DIAGNOSIS — N1832 Chronic kidney disease, stage 3b: Secondary | ICD-10-CM | POA: Diagnosis not present

## 2022-11-24 DIAGNOSIS — I951 Orthostatic hypotension: Secondary | ICD-10-CM | POA: Diagnosis not present

## 2022-11-24 DIAGNOSIS — N401 Enlarged prostate with lower urinary tract symptoms: Secondary | ICD-10-CM | POA: Diagnosis not present

## 2022-11-25 DIAGNOSIS — K2960 Other gastritis without bleeding: Secondary | ICD-10-CM | POA: Diagnosis not present

## 2022-11-25 DIAGNOSIS — J392 Other diseases of pharynx: Secondary | ICD-10-CM | POA: Diagnosis not present

## 2022-11-25 DIAGNOSIS — I951 Orthostatic hypotension: Secondary | ICD-10-CM | POA: Diagnosis not present

## 2022-11-25 DIAGNOSIS — F40240 Claustrophobia: Secondary | ICD-10-CM | POA: Diagnosis not present

## 2022-11-25 DIAGNOSIS — N1832 Chronic kidney disease, stage 3b: Secondary | ICD-10-CM | POA: Diagnosis not present

## 2022-11-25 DIAGNOSIS — I491 Atrial premature depolarization: Secondary | ICD-10-CM | POA: Diagnosis not present

## 2022-11-25 DIAGNOSIS — I672 Cerebral atherosclerosis: Secondary | ICD-10-CM | POA: Diagnosis not present

## 2022-11-25 DIAGNOSIS — N390 Urinary tract infection, site not specified: Secondary | ICD-10-CM | POA: Diagnosis not present

## 2022-11-25 DIAGNOSIS — I480 Paroxysmal atrial fibrillation: Secondary | ICD-10-CM | POA: Diagnosis not present

## 2022-11-25 DIAGNOSIS — E7800 Pure hypercholesterolemia, unspecified: Secondary | ICD-10-CM | POA: Diagnosis not present

## 2022-11-25 DIAGNOSIS — N138 Other obstructive and reflux uropathy: Secondary | ICD-10-CM | POA: Diagnosis not present

## 2022-11-25 DIAGNOSIS — I7121 Aneurysm of the ascending aorta, without rupture: Secondary | ICD-10-CM | POA: Diagnosis not present

## 2022-11-25 DIAGNOSIS — I252 Old myocardial infarction: Secondary | ICD-10-CM | POA: Diagnosis not present

## 2022-11-25 DIAGNOSIS — I700 Atherosclerosis of aorta: Secondary | ICD-10-CM | POA: Diagnosis not present

## 2022-11-25 DIAGNOSIS — M1990 Unspecified osteoarthritis, unspecified site: Secondary | ICD-10-CM | POA: Diagnosis not present

## 2022-11-25 DIAGNOSIS — K5730 Diverticulosis of large intestine without perforation or abscess without bleeding: Secondary | ICD-10-CM | POA: Diagnosis not present

## 2022-11-25 DIAGNOSIS — H9110 Presbycusis, unspecified ear: Secondary | ICD-10-CM | POA: Diagnosis not present

## 2022-11-25 DIAGNOSIS — D6869 Other thrombophilia: Secondary | ICD-10-CM | POA: Diagnosis not present

## 2022-11-25 DIAGNOSIS — D508 Other iron deficiency anemias: Secondary | ICD-10-CM | POA: Diagnosis not present

## 2022-11-25 DIAGNOSIS — I130 Hypertensive heart and chronic kidney disease with heart failure and stage 1 through stage 4 chronic kidney disease, or unspecified chronic kidney disease: Secondary | ICD-10-CM | POA: Diagnosis not present

## 2022-11-25 DIAGNOSIS — I5042 Chronic combined systolic (congestive) and diastolic (congestive) heart failure: Secondary | ICD-10-CM | POA: Diagnosis not present

## 2022-11-25 DIAGNOSIS — N401 Enlarged prostate with lower urinary tract symptoms: Secondary | ICD-10-CM | POA: Diagnosis not present

## 2022-11-25 DIAGNOSIS — I2510 Atherosclerotic heart disease of native coronary artery without angina pectoris: Secondary | ICD-10-CM | POA: Diagnosis not present

## 2022-11-25 DIAGNOSIS — I083 Combined rheumatic disorders of mitral, aortic and tricuspid valves: Secondary | ICD-10-CM | POA: Diagnosis not present

## 2022-11-25 DIAGNOSIS — K219 Gastro-esophageal reflux disease without esophagitis: Secondary | ICD-10-CM | POA: Diagnosis not present

## 2022-11-28 DIAGNOSIS — N1832 Chronic kidney disease, stage 3b: Secondary | ICD-10-CM | POA: Diagnosis not present

## 2022-11-28 DIAGNOSIS — D6869 Other thrombophilia: Secondary | ICD-10-CM | POA: Diagnosis not present

## 2022-11-28 DIAGNOSIS — I5042 Chronic combined systolic (congestive) and diastolic (congestive) heart failure: Secondary | ICD-10-CM | POA: Diagnosis not present

## 2022-11-28 DIAGNOSIS — I480 Paroxysmal atrial fibrillation: Secondary | ICD-10-CM | POA: Diagnosis not present

## 2022-11-28 DIAGNOSIS — I700 Atherosclerosis of aorta: Secondary | ICD-10-CM | POA: Diagnosis not present

## 2022-11-28 DIAGNOSIS — I130 Hypertensive heart and chronic kidney disease with heart failure and stage 1 through stage 4 chronic kidney disease, or unspecified chronic kidney disease: Secondary | ICD-10-CM | POA: Diagnosis not present

## 2022-11-29 DIAGNOSIS — I480 Paroxysmal atrial fibrillation: Secondary | ICD-10-CM | POA: Diagnosis not present

## 2022-11-29 DIAGNOSIS — I130 Hypertensive heart and chronic kidney disease with heart failure and stage 1 through stage 4 chronic kidney disease, or unspecified chronic kidney disease: Secondary | ICD-10-CM | POA: Diagnosis not present

## 2022-11-29 DIAGNOSIS — I700 Atherosclerosis of aorta: Secondary | ICD-10-CM | POA: Diagnosis not present

## 2022-11-29 DIAGNOSIS — D6869 Other thrombophilia: Secondary | ICD-10-CM | POA: Diagnosis not present

## 2022-11-29 DIAGNOSIS — I5042 Chronic combined systolic (congestive) and diastolic (congestive) heart failure: Secondary | ICD-10-CM | POA: Diagnosis not present

## 2022-11-29 DIAGNOSIS — N1832 Chronic kidney disease, stage 3b: Secondary | ICD-10-CM | POA: Diagnosis not present

## 2022-11-30 DIAGNOSIS — I480 Paroxysmal atrial fibrillation: Secondary | ICD-10-CM | POA: Diagnosis not present

## 2022-11-30 DIAGNOSIS — N1832 Chronic kidney disease, stage 3b: Secondary | ICD-10-CM | POA: Diagnosis not present

## 2022-11-30 DIAGNOSIS — I130 Hypertensive heart and chronic kidney disease with heart failure and stage 1 through stage 4 chronic kidney disease, or unspecified chronic kidney disease: Secondary | ICD-10-CM | POA: Diagnosis not present

## 2022-11-30 DIAGNOSIS — I5042 Chronic combined systolic (congestive) and diastolic (congestive) heart failure: Secondary | ICD-10-CM | POA: Diagnosis not present

## 2022-11-30 DIAGNOSIS — D6869 Other thrombophilia: Secondary | ICD-10-CM | POA: Diagnosis not present

## 2022-11-30 DIAGNOSIS — I700 Atherosclerosis of aorta: Secondary | ICD-10-CM | POA: Diagnosis not present

## 2022-12-01 DIAGNOSIS — I5042 Chronic combined systolic (congestive) and diastolic (congestive) heart failure: Secondary | ICD-10-CM | POA: Diagnosis not present

## 2022-12-01 DIAGNOSIS — D6869 Other thrombophilia: Secondary | ICD-10-CM | POA: Diagnosis not present

## 2022-12-01 DIAGNOSIS — N1832 Chronic kidney disease, stage 3b: Secondary | ICD-10-CM | POA: Diagnosis not present

## 2022-12-01 DIAGNOSIS — I130 Hypertensive heart and chronic kidney disease with heart failure and stage 1 through stage 4 chronic kidney disease, or unspecified chronic kidney disease: Secondary | ICD-10-CM | POA: Diagnosis not present

## 2022-12-01 DIAGNOSIS — I700 Atherosclerosis of aorta: Secondary | ICD-10-CM | POA: Diagnosis not present

## 2022-12-01 DIAGNOSIS — I480 Paroxysmal atrial fibrillation: Secondary | ICD-10-CM | POA: Diagnosis not present

## 2022-12-03 DIAGNOSIS — I130 Hypertensive heart and chronic kidney disease with heart failure and stage 1 through stage 4 chronic kidney disease, or unspecified chronic kidney disease: Secondary | ICD-10-CM | POA: Diagnosis not present

## 2022-12-06 DIAGNOSIS — I700 Atherosclerosis of aorta: Secondary | ICD-10-CM | POA: Diagnosis not present

## 2022-12-06 DIAGNOSIS — I130 Hypertensive heart and chronic kidney disease with heart failure and stage 1 through stage 4 chronic kidney disease, or unspecified chronic kidney disease: Secondary | ICD-10-CM | POA: Diagnosis not present

## 2022-12-06 DIAGNOSIS — D6869 Other thrombophilia: Secondary | ICD-10-CM | POA: Diagnosis not present

## 2022-12-06 DIAGNOSIS — I480 Paroxysmal atrial fibrillation: Secondary | ICD-10-CM | POA: Diagnosis not present

## 2022-12-06 DIAGNOSIS — I5042 Chronic combined systolic (congestive) and diastolic (congestive) heart failure: Secondary | ICD-10-CM | POA: Diagnosis not present

## 2022-12-06 DIAGNOSIS — N1832 Chronic kidney disease, stage 3b: Secondary | ICD-10-CM | POA: Diagnosis not present

## 2022-12-07 DIAGNOSIS — N1832 Chronic kidney disease, stage 3b: Secondary | ICD-10-CM | POA: Diagnosis not present

## 2022-12-07 DIAGNOSIS — D6869 Other thrombophilia: Secondary | ICD-10-CM | POA: Diagnosis not present

## 2022-12-07 DIAGNOSIS — I700 Atherosclerosis of aorta: Secondary | ICD-10-CM | POA: Diagnosis not present

## 2022-12-07 DIAGNOSIS — I130 Hypertensive heart and chronic kidney disease with heart failure and stage 1 through stage 4 chronic kidney disease, or unspecified chronic kidney disease: Secondary | ICD-10-CM | POA: Diagnosis not present

## 2022-12-07 DIAGNOSIS — I480 Paroxysmal atrial fibrillation: Secondary | ICD-10-CM | POA: Diagnosis not present

## 2022-12-07 DIAGNOSIS — I5042 Chronic combined systolic (congestive) and diastolic (congestive) heart failure: Secondary | ICD-10-CM | POA: Diagnosis not present

## 2022-12-08 DIAGNOSIS — I5042 Chronic combined systolic (congestive) and diastolic (congestive) heart failure: Secondary | ICD-10-CM | POA: Diagnosis not present

## 2022-12-08 DIAGNOSIS — D6869 Other thrombophilia: Secondary | ICD-10-CM | POA: Diagnosis not present

## 2022-12-08 DIAGNOSIS — I700 Atherosclerosis of aorta: Secondary | ICD-10-CM | POA: Diagnosis not present

## 2022-12-08 DIAGNOSIS — I480 Paroxysmal atrial fibrillation: Secondary | ICD-10-CM | POA: Diagnosis not present

## 2022-12-08 DIAGNOSIS — I130 Hypertensive heart and chronic kidney disease with heart failure and stage 1 through stage 4 chronic kidney disease, or unspecified chronic kidney disease: Secondary | ICD-10-CM | POA: Diagnosis not present

## 2022-12-08 DIAGNOSIS — N1832 Chronic kidney disease, stage 3b: Secondary | ICD-10-CM | POA: Diagnosis not present

## 2022-12-09 DIAGNOSIS — I480 Paroxysmal atrial fibrillation: Secondary | ICD-10-CM | POA: Diagnosis not present

## 2022-12-09 DIAGNOSIS — N1832 Chronic kidney disease, stage 3b: Secondary | ICD-10-CM | POA: Diagnosis not present

## 2022-12-09 DIAGNOSIS — I700 Atherosclerosis of aorta: Secondary | ICD-10-CM | POA: Diagnosis not present

## 2022-12-09 DIAGNOSIS — I130 Hypertensive heart and chronic kidney disease with heart failure and stage 1 through stage 4 chronic kidney disease, or unspecified chronic kidney disease: Secondary | ICD-10-CM | POA: Diagnosis not present

## 2022-12-09 DIAGNOSIS — D6869 Other thrombophilia: Secondary | ICD-10-CM | POA: Diagnosis not present

## 2022-12-09 DIAGNOSIS — I5042 Chronic combined systolic (congestive) and diastolic (congestive) heart failure: Secondary | ICD-10-CM | POA: Diagnosis not present

## 2022-12-13 DIAGNOSIS — I130 Hypertensive heart and chronic kidney disease with heart failure and stage 1 through stage 4 chronic kidney disease, or unspecified chronic kidney disease: Secondary | ICD-10-CM | POA: Diagnosis not present

## 2022-12-13 DIAGNOSIS — I480 Paroxysmal atrial fibrillation: Secondary | ICD-10-CM | POA: Diagnosis not present

## 2022-12-13 DIAGNOSIS — N1832 Chronic kidney disease, stage 3b: Secondary | ICD-10-CM | POA: Diagnosis not present

## 2022-12-13 DIAGNOSIS — D6869 Other thrombophilia: Secondary | ICD-10-CM | POA: Diagnosis not present

## 2022-12-13 DIAGNOSIS — I5042 Chronic combined systolic (congestive) and diastolic (congestive) heart failure: Secondary | ICD-10-CM | POA: Diagnosis not present

## 2022-12-13 DIAGNOSIS — I700 Atherosclerosis of aorta: Secondary | ICD-10-CM | POA: Diagnosis not present

## 2022-12-15 DIAGNOSIS — I130 Hypertensive heart and chronic kidney disease with heart failure and stage 1 through stage 4 chronic kidney disease, or unspecified chronic kidney disease: Secondary | ICD-10-CM | POA: Diagnosis not present

## 2022-12-15 DIAGNOSIS — I480 Paroxysmal atrial fibrillation: Secondary | ICD-10-CM | POA: Diagnosis not present

## 2022-12-15 DIAGNOSIS — D6869 Other thrombophilia: Secondary | ICD-10-CM | POA: Diagnosis not present

## 2022-12-15 DIAGNOSIS — I5042 Chronic combined systolic (congestive) and diastolic (congestive) heart failure: Secondary | ICD-10-CM | POA: Diagnosis not present

## 2022-12-15 DIAGNOSIS — N1832 Chronic kidney disease, stage 3b: Secondary | ICD-10-CM | POA: Diagnosis not present

## 2022-12-15 DIAGNOSIS — I700 Atherosclerosis of aorta: Secondary | ICD-10-CM | POA: Diagnosis not present

## 2022-12-20 DIAGNOSIS — D6869 Other thrombophilia: Secondary | ICD-10-CM | POA: Diagnosis not present

## 2022-12-20 DIAGNOSIS — I480 Paroxysmal atrial fibrillation: Secondary | ICD-10-CM | POA: Diagnosis not present

## 2022-12-20 DIAGNOSIS — I700 Atherosclerosis of aorta: Secondary | ICD-10-CM | POA: Diagnosis not present

## 2022-12-20 DIAGNOSIS — I130 Hypertensive heart and chronic kidney disease with heart failure and stage 1 through stage 4 chronic kidney disease, or unspecified chronic kidney disease: Secondary | ICD-10-CM | POA: Diagnosis not present

## 2022-12-20 DIAGNOSIS — N1832 Chronic kidney disease, stage 3b: Secondary | ICD-10-CM | POA: Diagnosis not present

## 2022-12-20 DIAGNOSIS — I5042 Chronic combined systolic (congestive) and diastolic (congestive) heart failure: Secondary | ICD-10-CM | POA: Diagnosis not present

## 2022-12-21 DIAGNOSIS — C44310 Basal cell carcinoma of skin of unspecified parts of face: Secondary | ICD-10-CM | POA: Diagnosis not present

## 2022-12-21 DIAGNOSIS — D508 Other iron deficiency anemias: Secondary | ICD-10-CM | POA: Diagnosis not present

## 2022-12-21 DIAGNOSIS — C44319 Basal cell carcinoma of skin of other parts of face: Secondary | ICD-10-CM | POA: Diagnosis not present

## 2022-12-21 DIAGNOSIS — I951 Orthostatic hypotension: Secondary | ICD-10-CM | POA: Diagnosis not present

## 2022-12-21 DIAGNOSIS — L814 Other melanin hyperpigmentation: Secondary | ICD-10-CM | POA: Diagnosis not present

## 2022-12-21 DIAGNOSIS — I10 Essential (primary) hypertension: Secondary | ICD-10-CM | POA: Diagnosis not present

## 2022-12-22 ENCOUNTER — Other Ambulatory Visit: Payer: Self-pay | Admitting: Cardiology

## 2022-12-22 NOTE — Telephone Encounter (Signed)
Prescription refill request for Eliquis received. Indication: Afib  Last office visit: 08/03/22 [facility])  Scr: 0.60 (07/26/22 via [facility])  Age: 89 Weight: 81.2kg  Per Dr Dulce Sellar office visit note on 08/03/22, "continue his anticoagulant reduced dose"  Refill sent.

## 2022-12-25 DIAGNOSIS — I130 Hypertensive heart and chronic kidney disease with heart failure and stage 1 through stage 4 chronic kidney disease, or unspecified chronic kidney disease: Secondary | ICD-10-CM | POA: Diagnosis not present

## 2022-12-25 DIAGNOSIS — D6869 Other thrombophilia: Secondary | ICD-10-CM | POA: Diagnosis not present

## 2022-12-25 DIAGNOSIS — N401 Enlarged prostate with lower urinary tract symptoms: Secondary | ICD-10-CM | POA: Diagnosis not present

## 2022-12-25 DIAGNOSIS — M1990 Unspecified osteoarthritis, unspecified site: Secondary | ICD-10-CM | POA: Diagnosis not present

## 2022-12-25 DIAGNOSIS — K219 Gastro-esophageal reflux disease without esophagitis: Secondary | ICD-10-CM | POA: Diagnosis not present

## 2022-12-25 DIAGNOSIS — I491 Atrial premature depolarization: Secondary | ICD-10-CM | POA: Diagnosis not present

## 2022-12-25 DIAGNOSIS — I083 Combined rheumatic disorders of mitral, aortic and tricuspid valves: Secondary | ICD-10-CM | POA: Diagnosis not present

## 2022-12-25 DIAGNOSIS — N1832 Chronic kidney disease, stage 3b: Secondary | ICD-10-CM | POA: Diagnosis not present

## 2022-12-25 DIAGNOSIS — N390 Urinary tract infection, site not specified: Secondary | ICD-10-CM | POA: Diagnosis not present

## 2022-12-25 DIAGNOSIS — J392 Other diseases of pharynx: Secondary | ICD-10-CM | POA: Diagnosis not present

## 2022-12-25 DIAGNOSIS — I672 Cerebral atherosclerosis: Secondary | ICD-10-CM | POA: Diagnosis not present

## 2022-12-25 DIAGNOSIS — I7121 Aneurysm of the ascending aorta, without rupture: Secondary | ICD-10-CM | POA: Diagnosis not present

## 2022-12-25 DIAGNOSIS — K5730 Diverticulosis of large intestine without perforation or abscess without bleeding: Secondary | ICD-10-CM | POA: Diagnosis not present

## 2022-12-25 DIAGNOSIS — I252 Old myocardial infarction: Secondary | ICD-10-CM | POA: Diagnosis not present

## 2022-12-25 DIAGNOSIS — I480 Paroxysmal atrial fibrillation: Secondary | ICD-10-CM | POA: Diagnosis not present

## 2022-12-25 DIAGNOSIS — I951 Orthostatic hypotension: Secondary | ICD-10-CM | POA: Diagnosis not present

## 2022-12-25 DIAGNOSIS — F40240 Claustrophobia: Secondary | ICD-10-CM | POA: Diagnosis not present

## 2022-12-25 DIAGNOSIS — I2510 Atherosclerotic heart disease of native coronary artery without angina pectoris: Secondary | ICD-10-CM | POA: Diagnosis not present

## 2022-12-25 DIAGNOSIS — I5042 Chronic combined systolic (congestive) and diastolic (congestive) heart failure: Secondary | ICD-10-CM | POA: Diagnosis not present

## 2022-12-25 DIAGNOSIS — H9110 Presbycusis, unspecified ear: Secondary | ICD-10-CM | POA: Diagnosis not present

## 2022-12-25 DIAGNOSIS — N138 Other obstructive and reflux uropathy: Secondary | ICD-10-CM | POA: Diagnosis not present

## 2022-12-25 DIAGNOSIS — D508 Other iron deficiency anemias: Secondary | ICD-10-CM | POA: Diagnosis not present

## 2022-12-25 DIAGNOSIS — K2960 Other gastritis without bleeding: Secondary | ICD-10-CM | POA: Diagnosis not present

## 2022-12-25 DIAGNOSIS — E7800 Pure hypercholesterolemia, unspecified: Secondary | ICD-10-CM | POA: Diagnosis not present

## 2022-12-25 DIAGNOSIS — I700 Atherosclerosis of aorta: Secondary | ICD-10-CM | POA: Diagnosis not present

## 2022-12-26 DIAGNOSIS — I5042 Chronic combined systolic (congestive) and diastolic (congestive) heart failure: Secondary | ICD-10-CM | POA: Diagnosis not present

## 2022-12-26 DIAGNOSIS — I700 Atherosclerosis of aorta: Secondary | ICD-10-CM | POA: Diagnosis not present

## 2022-12-26 DIAGNOSIS — N1832 Chronic kidney disease, stage 3b: Secondary | ICD-10-CM | POA: Diagnosis not present

## 2022-12-26 DIAGNOSIS — D6869 Other thrombophilia: Secondary | ICD-10-CM | POA: Diagnosis not present

## 2022-12-26 DIAGNOSIS — I130 Hypertensive heart and chronic kidney disease with heart failure and stage 1 through stage 4 chronic kidney disease, or unspecified chronic kidney disease: Secondary | ICD-10-CM | POA: Diagnosis not present

## 2022-12-26 DIAGNOSIS — I480 Paroxysmal atrial fibrillation: Secondary | ICD-10-CM | POA: Diagnosis not present

## 2022-12-28 DIAGNOSIS — I700 Atherosclerosis of aorta: Secondary | ICD-10-CM | POA: Diagnosis not present

## 2022-12-28 DIAGNOSIS — N1832 Chronic kidney disease, stage 3b: Secondary | ICD-10-CM | POA: Diagnosis not present

## 2022-12-28 DIAGNOSIS — I5042 Chronic combined systolic (congestive) and diastolic (congestive) heart failure: Secondary | ICD-10-CM | POA: Diagnosis not present

## 2022-12-28 DIAGNOSIS — I130 Hypertensive heart and chronic kidney disease with heart failure and stage 1 through stage 4 chronic kidney disease, or unspecified chronic kidney disease: Secondary | ICD-10-CM | POA: Diagnosis not present

## 2022-12-28 DIAGNOSIS — I480 Paroxysmal atrial fibrillation: Secondary | ICD-10-CM | POA: Diagnosis not present

## 2022-12-28 DIAGNOSIS — D6869 Other thrombophilia: Secondary | ICD-10-CM | POA: Diagnosis not present

## 2023-01-03 DIAGNOSIS — N401 Enlarged prostate with lower urinary tract symptoms: Secondary | ICD-10-CM | POA: Diagnosis not present

## 2023-01-03 DIAGNOSIS — R351 Nocturia: Secondary | ICD-10-CM | POA: Diagnosis not present

## 2023-01-04 DIAGNOSIS — I700 Atherosclerosis of aorta: Secondary | ICD-10-CM | POA: Diagnosis not present

## 2023-01-04 DIAGNOSIS — I130 Hypertensive heart and chronic kidney disease with heart failure and stage 1 through stage 4 chronic kidney disease, or unspecified chronic kidney disease: Secondary | ICD-10-CM | POA: Diagnosis not present

## 2023-01-04 DIAGNOSIS — I480 Paroxysmal atrial fibrillation: Secondary | ICD-10-CM | POA: Diagnosis not present

## 2023-01-04 DIAGNOSIS — D6869 Other thrombophilia: Secondary | ICD-10-CM | POA: Diagnosis not present

## 2023-01-04 DIAGNOSIS — N1832 Chronic kidney disease, stage 3b: Secondary | ICD-10-CM | POA: Diagnosis not present

## 2023-01-04 DIAGNOSIS — I5042 Chronic combined systolic (congestive) and diastolic (congestive) heart failure: Secondary | ICD-10-CM | POA: Diagnosis not present

## 2023-01-05 DIAGNOSIS — I130 Hypertensive heart and chronic kidney disease with heart failure and stage 1 through stage 4 chronic kidney disease, or unspecified chronic kidney disease: Secondary | ICD-10-CM | POA: Diagnosis not present

## 2023-01-05 DIAGNOSIS — I700 Atherosclerosis of aorta: Secondary | ICD-10-CM | POA: Diagnosis not present

## 2023-01-05 DIAGNOSIS — I5042 Chronic combined systolic (congestive) and diastolic (congestive) heart failure: Secondary | ICD-10-CM | POA: Diagnosis not present

## 2023-01-05 DIAGNOSIS — N1832 Chronic kidney disease, stage 3b: Secondary | ICD-10-CM | POA: Diagnosis not present

## 2023-01-05 DIAGNOSIS — D6869 Other thrombophilia: Secondary | ICD-10-CM | POA: Diagnosis not present

## 2023-01-05 DIAGNOSIS — I480 Paroxysmal atrial fibrillation: Secondary | ICD-10-CM | POA: Diagnosis not present

## 2023-01-11 DIAGNOSIS — I5042 Chronic combined systolic (congestive) and diastolic (congestive) heart failure: Secondary | ICD-10-CM | POA: Diagnosis not present

## 2023-01-11 DIAGNOSIS — N1832 Chronic kidney disease, stage 3b: Secondary | ICD-10-CM | POA: Diagnosis not present

## 2023-01-11 DIAGNOSIS — D6869 Other thrombophilia: Secondary | ICD-10-CM | POA: Diagnosis not present

## 2023-01-11 DIAGNOSIS — I480 Paroxysmal atrial fibrillation: Secondary | ICD-10-CM | POA: Diagnosis not present

## 2023-01-11 DIAGNOSIS — I700 Atherosclerosis of aorta: Secondary | ICD-10-CM | POA: Diagnosis not present

## 2023-01-11 DIAGNOSIS — I130 Hypertensive heart and chronic kidney disease with heart failure and stage 1 through stage 4 chronic kidney disease, or unspecified chronic kidney disease: Secondary | ICD-10-CM | POA: Diagnosis not present

## 2023-01-13 DIAGNOSIS — K222 Esophageal obstruction: Secondary | ICD-10-CM | POA: Diagnosis not present

## 2023-01-13 DIAGNOSIS — R1310 Dysphagia, unspecified: Secondary | ICD-10-CM | POA: Diagnosis not present

## 2023-01-16 DIAGNOSIS — I480 Paroxysmal atrial fibrillation: Secondary | ICD-10-CM | POA: Diagnosis not present

## 2023-01-16 DIAGNOSIS — I5042 Chronic combined systolic (congestive) and diastolic (congestive) heart failure: Secondary | ICD-10-CM | POA: Diagnosis not present

## 2023-01-16 DIAGNOSIS — D6869 Other thrombophilia: Secondary | ICD-10-CM | POA: Diagnosis not present

## 2023-01-16 DIAGNOSIS — N1832 Chronic kidney disease, stage 3b: Secondary | ICD-10-CM | POA: Diagnosis not present

## 2023-01-16 DIAGNOSIS — I700 Atherosclerosis of aorta: Secondary | ICD-10-CM | POA: Diagnosis not present

## 2023-01-16 DIAGNOSIS — I130 Hypertensive heart and chronic kidney disease with heart failure and stage 1 through stage 4 chronic kidney disease, or unspecified chronic kidney disease: Secondary | ICD-10-CM | POA: Diagnosis not present

## 2023-02-07 DIAGNOSIS — R1310 Dysphagia, unspecified: Secondary | ICD-10-CM | POA: Diagnosis not present

## 2023-02-13 NOTE — Progress Notes (Unsigned)
Cardiology Office Note:    Date:  02/14/2023   ID:  Joshua Chambers, Joshua Chambers [birth year], MRN [id number]  PCP:  Street, Stephanie Coup, MD  Cardiologist:  Norman Herrlich, MD    Referring MD: [address] Stephanie Coup, *    ASSESSMENT:    1. Orthostatic hypotension   2. Paroxysmal atrial fibrillation (HCC)   3. Chronic anticoagulation   4. Coronary artery disease involving coronary bypass graft of native heart without angina pectoris; CAD (coronary artery disease) of artery bypass graft:  loss of VG to RCA and VG-Diag   5. Hypertensive heart disease with chronic combined systolic and diastolic congestive heart failure (HCC)   6. Dyslipidemia, goal LDL below 70   7. Ascending aorta enlargement (HCC)    PLAN:    In order of problems listed above:  Overall improved continue monitoring which has been remarkably effective in improving the quality of his life and avoiding trauma and ED visits Stable continue his anticoagulant beta-blocker Stable CAD fortunately no anginal discomfort I do not think he is a candidate for interventional cardiac procedures at this point in his life Well compensated not requiring a loop diuretic Continue his statin well-tolerated check lipid profile We decided not to do further imaging he would not be a candidate for elective intervention for thoracic aortic aneurysm   Next appointment: 6 months   Medication Adjustments/Labs and Tests Ordered: Current medicines are reviewed at length with the patient today.  Concerns regarding medicines are outlined above.  No orders of the defined types were placed in this encounter.  No orders of the defined types were placed in this encounter.    History of Present Illness:    Joshua Chambers is a 89 y.o. male with a hx of very complex heart disease including CAD with remote bypass surgery 2007 hypertensive heart disease complicated by symptomatic orthostatic hypotension requiring midodrine therapy paroxysmal atrial  fibrillation with TIA and recurrent GI bleeding last seen 08/03/2022.  He also has enlargement of the ascending aorta on CT scan 44 mm.  Compliance with diet, lifestyle and medications: Yes  Overall he is done well but today is not a good day with prolonged sitting he starts to feel weak and his blood pressure in the range of 100 systolic he did take his midodrine at lunchtime For today has been no fainting chest pain edema shortness of breath. He has not had bleeding from his anticoagulant He had several ED visits in [place] for urine infection Past Medical History:  Diagnosis Date   Arteriosclerosis of arterial coronary artery bypass graft    Arthritis    Atrial contractions, premature    Atrial fibrillation (HCC)    Benign hypertension    Bilateral carotid artery stenosis    CAD (coronary artery disease), autologous vein bypass graft 04/19/2013   100% flush occlusion of SVG-OM & SVG-Diag; near Subtotal Occlusion of SVG-RCA (PCI of RCA done to avoid distal embolization with PCI to SVG)   CAD (coronary artery disease), native coronary artery 2007   Multivessel CAD referred for CABG   CAD S/P percutaneous coronary angioplasty  04/19/2013   PCI of Native RCA - Promus Premier DES 3.0 mm x 38 mm (3.6 mm);    Chronic anticoagulation 04/17/2018   Chronic combined systolic and diastolic heart failure (HCC)    Coronary artery disease involving coronary bypass graft of native heart without angina pectoris; CAD (coronary artery disease) of artery bypass graft:  loss of VG to RCA and VG-Diag  04/17/2013   Dyslipidemia    Dyslipidemia, goal LDL below 70    Dyspnea 04/30/2013   Essential hypertension 05/02/2013   Fatigue 04/17/2013   GERD (gastroesophageal reflux disease)    Hematuria 05/02/2013   History of DVT of lower extremity    Right popliteal vein   History of stroke August 2011   Possible TIA in 2001;  Echo: EF 45-50%, mild HK of anterior septum.   History of transient cerebral  ischemia    Hx of orthostatic hypotension 2009   Positive tilt table:  dizziness/vasopressor type; previously on the decision    Hx SBO    Ischemic cardiomyopathy 02/06/2018   Nephrolithiasis    Orthostatic hypotension    Other and unspecified coagulation defects 04/17/2013   PAF (paroxysmal atrial fibrillation) (HCC) 02/06/2018   Premature atrial contractions 07/28/2015   Preoperative examination, unspecified 04/17/2013   S/P CABG x 3 2007   LIMA-LAD, SVG-DIAG, SVG-RCA   S/P CABG x 4; LIMA-LAD, SVG-RPDA, SVG-OM, SVG-D1 -- all but LIMA-LAD occluded 03/18/2006   03/2006 - LIMA-LAD, SVG-D1, SVG-RPDA    Sinus arrhythmia 09/09/2016   Stroke [facility])    TIA (transient ischemic attack) 03/16/2017   Unsteady gait     Current Medications: Current Meds  Medication Sig   Acetaminophen (TYLENOL EXTRA STRENGTH PO) Take 1,000 mg by mouth 2 (two) times daily as needed (PAIN).   atorvastatin (LIPITOR) 20 MG tablet Take 20 mg by mouth at bedtime.   cyanocobalamin (VITAMIN B12) 1000 MCG tablet Take 1,000 mcg by mouth daily.   ELIQUIS 2.5 MG TABS tablet TAKE 1 TABLET(2.5 MG) BY MOUTH TWICE DAILY   esomeprazole (NEXIUM) 20 MG capsule Take 20 mg by mouth daily at 12 noon.   fludrocortisone (FLORINEF) 0.1 MG tablet TAKE 1 TABLET(0.1 MG) BY MOUTH DAILY   metoprolol tartrate (LOPRESSOR) 25 MG tablet Take 0.5 tablets (12.5 mg total) by mouth daily.   midodrine (PROAMATINE) 10 MG tablet TAKE 1 TABLET(10 MG) BY MOUTH FOUR TIMES DAILY (Patient taking differently: Take 10 mg by mouth 3 (three) times daily.)   nitroGLYCERIN (NITROSTAT) 0.4 MG SL tablet Place 1 tablet (0.4 mg total) under the tongue every 5 (five) minutes as needed for chest pain.   oxyCODONE (OXY IR/ROXICODONE) 5 MG immediate release tablet Take 5 mg by mouth every 8 (eight) hours as needed for moderate pain or severe pain.   Polyvinyl Alcohol-Povidone (REFRESH OP) Place 1 drop into both eyes daily as needed (DRY EYES).   PULMICORT FLEXHALER 180 MCG/ACT  inhaler Inhale 2 puffs into the lungs daily.    pyridostigmine (MESTINON) 60 MG tablet 1/2 to 1 pill po tid (Patient taking differently: Take 30 mg by mouth 3 (three) times daily. 1/2 to 1 pill po tid)   QUEtiapine (SEROQUEL) 25 MG tablet Take 12.5 mg by mouth at bedtime.   Sennosides (SENOKOT PO) Take 5 mg by mouth 2 (two) times daily as needed (CONSTIPATION).      EKGs/Labs/Other Studies Reviewed:    The following studies were reviewed today:  Cardiac Studies & Procedures       ECHOCARDIOGRAM  ECHOCARDIOGRAM COMPLETE 03/17/2017  Narrative *[facility]* *[facility]* [address] [place], [place] [address] [phone]  ------------------------------------------------------------------- Transthoracic Echocardiography  Patient:    Jamiyl, Flight MR #:       [id number] Study Date: 03/17/2017 Gender:     M Age:        89 Height:     188 cm Weight:  88.9 kg BSA:        2.16 m^2 Pt. Status: Room:       5M14C  ADMITTING    Rai, Ripudeep K ORDERING     Rai, Ripudeep K REFERRING    Rai, Ripudeep K PERFORMING   [facility], Inpatient ATTENDING    Little, Ambrose Finland SONOGRAPHER  Dance, Tiffany  cc:  ------------------------------------------------------------------- LV EF: 50% -   55%  ------------------------------------------------------------------- Indications:      TIA 435.9.  ------------------------------------------------------------------- History:   PMH:   Coronary artery disease.  Stroke.  Risk factors: Dyslipidemia.  ------------------------------------------------------------------- Study Conclusions  - Left ventricle: Poor acoustic windows limit study LVEF is approximately 50 to 55% with mild inferior, inferoseptal hypokinesis. The cavity size was normal. Wall thickness was normal. Systolic function was normal. The estimated ejection fraction was in the range of 50% to 55%. - Aortic valve: There was trivial regurgitation. - Mitral  valve: There was mild regurgitation.  ------------------------------------------------------------------- Labs, prior tests, procedures, and surgery: Coronary artery bypass grafting.  ------------------------------------------------------------------- Study data:  No prior study was available for comparison.  Study status:  Routine.  Procedure:  The patient reported no pain pre or post test. Transthoracic echocardiography. Image quality was adequate.  Study completion:  There were no complications. Transthoracic echocardiography.  M-mode, complete 2D, spectral Doppler, and color Doppler.  Birthdate:  Patient birthdate: Mar 10, 1923.  Age:  Patient is 89 yr old.  Sex:  Gender: male. BMI: 25.2 kg/m^2.  Blood pressure:     156/92  Patient status: Inpatient.  Study date:  Study date: 03/17/2017. Study time: 12:51 PM.  Location:  Bedside.  -------------------------------------------------------------------  ------------------------------------------------------------------- Left ventricle:  Poor acoustic windows limit study LVEF is approximately 50 to 55% with mild inferior, inferoseptal hypokinesis. The cavity size was normal. Wall thickness was normal. Systolic function was normal. The estimated ejection fraction was in the range of 50% to 55%.  ------------------------------------------------------------------- Aortic valve:   Mildly thickened, mildly calcified leaflets. Doppler:  There was trivial regurgitation.  ------------------------------------------------------------------- Mitral valve:   Structurally normal valve.   Leaflet separation was normal.  Doppler:  Transvalvular velocity was within the normal range. There was no evidence for stenosis. There was mild regurgitation.    Peak gradient (D): 2 mm Hg.  ------------------------------------------------------------------- Left atrium:  The atrium was normal in  size.  ------------------------------------------------------------------- Right ventricle:  The cavity size was normal. Wall thickness was normal. Systolic function was normal.  ------------------------------------------------------------------- Pulmonic valve:   Poorly visualized.  Doppler:  There was no significant regurgitation.  ------------------------------------------------------------------- Tricuspid valve:   Structurally normal valve.   Leaflet separation was normal.  Doppler:  Transvalvular velocity was within the normal range. There was mild regurgitation.  ------------------------------------------------------------------- Right atrium:  The atrium was normal in size.  ------------------------------------------------------------------- Pericardium:  There was no pericardial effusion.  ------------------------------------------------------------------- Systemic veins: Inferior vena cava: The vessel was normal in size. The respirophasic diameter changes were in the normal range (>= 50%), consistent with normal central venous pressure.  ------------------------------------------------------------------- Measurements  Left ventricle                          Value        Reference LV ID, ED, PLAX chordal                 48.6  mm     43 - 52 LV ID, ES, PLAX chordal  37.1  mm     23 - 38 LV fx shortening, PLAX chordal  (L)     24    %      >=29 LV PW thickness, ED                     14.2  mm     ---------- IVS/LV PW ratio, ED                     0.99         <=1.3  Ventricular septum                      Value        Reference IVS thickness, ED                       14.1  mm     ----------  LVOT                                    Value        Reference LVOT ID, S                              22    mm     ---------- LVOT area                               3.8   cm^2   ----------  Aorta                                   Value        Reference Aortic  root ID, ED                      44    mm     ----------  Left atrium                             Value        Reference LA ID, A-P, ES                          45    mm     ---------- LA ID/bsa, A-P                          2.08  cm/m^2 <=2.2 LA volume, S                            76.7  ml     ---------- LA volume/bsa, S                        35.5  ml/m^2 ---------- LA volume, ES, 1-p A4C                  66.4  ml     ---------- LA volume/bsa, ES, 1-p A4C              30.7  ml/m^2 ---------- LA volume, ES, 1-p A2C                  82.4  ml     ---------- LA volume/bsa, ES, 1-p A2C              38.1  ml/m^2 ----------  Mitral valve                            Value        Reference Mitral E-wave peak velocity             73.2  cm/s   ---------- Mitral A-wave peak velocity             94.1  cm/s   ---------- Mitral deceleration time        (H)     271   ms     150 - 230 Mitral peak gradient, D                 2     mm Hg  ---------- Mitral E/A ratio, peak                  0.8          ---------- Mitral regurg VTI, PISA                 177   cm     ---------- Mitral ERO, PISA                        0.04  cm^2   ---------- Mitral regurg volume, PISA              7     ml     ----------  Tricuspid valve                         Value        Reference Tricuspid regurg peak velocity          190   cm/s   ---------- Tricuspid peak RV-RA gradient           14    mm Hg  ----------  Right atrium                            Value        Reference RA ID, S-I, ES, A4C             (H)     60.6  mm     34 - 49 RA area, ES, A4C                        17    cm^2   8.3 - 19.5 RA volume, ES, A/L                      37.8  ml     ---------- RA volume/bsa, ES, A/L                  17.5  ml/m^2 ----------  Right ventricle                         Value        Reference RV ID, minor axis, ED, A4C base  21.5  mm     ---------- TAPSE                                   18    mm      ----------  Legend: (L)  and  (H)  mark values outside specified reference range.  ------------------------------------------------------------------- Prepared and Electronically Authenticated by  Dietrich Pates, M.D. 2018-08-31T15:09:38    MONITORS  CARDIAC EVENT MONITOR 04/06/2018  Narrative An event monitor was performed for 30 days initiating 04/06/2018 to evaluate paroxysmal atrial fibrillation.  Baseline rhythm is sinus 80 bpm.  The rhythm throughout the 30 days was atrial fibrillation.  The minimum and maximum heart rates are 39 and 118 bpm.  There were no pauses of greater than 3 seconds.  There were 5 triggered events without symptoms.  3 of the 5 were sinus rhythm with sinus arrhythmia.  One showed the presence of frequent APCs and bigeminy and the second showed the presence of occasional ventricular premature contractions.  Conclusion occasional atrial and premature contractions.  The triggered events were associated with arrhythmia and 2 with 5 recordings.               Recent Labs: No results found for requested labs within last 365 days.  Recent Lipid Panel    Component Value Date/Time   CHOL 168 04/16/2020 0942   TRIG 147 04/16/2020 0942   HDL 46 04/16/2020 0942   CHOLHDL 3.7 04/16/2020 0942   CHOLHDL 3.7 03/17/2017 0006   VLDL 21 03/17/2017 0006   LDLCALC 96 04/16/2020 0942    Physical Exam:    VS:  BP 100/70 (BP Location: Right Arm, Patient Position: Sitting, Cuff Size: Normal)   Pulse 62   Ht 6\' 2"  (1.88 m)   Wt 174 lb (78.9 kg) Comment: Verbal, 2 months ago  SpO2 95%   BMI 22.34 kg/m     Wt Readings from Last 3 Encounters:  02/14/23 174 lb (78.9 kg)  11/10/21 179 lb (81.2 kg)  12/29/20 189 lb (85.7 kg)     GEN: Appears his age and appears frail well nourished, well developed in no acute distress HEENT: Normal NECK: No JVD; No carotid bruits LYMPHATICS: No lymphadenopathy CARDIAC: RRR, no murmurs, rubs, gallops RESPIRATORY:  Clear to  auscultation without rales, wheezing or rhonchi  ABDOMEN: Soft, non-tender, non-distended MUSCULOSKELETAL:  No edema; No deformity  SKIN: Warm and dry NEUROLOGIC:  Alert and oriented x 3 PSYCHIATRIC:  Normal affect    Signed, Norman Herrlich, MD  02/14/2023 2:09 PM    [facility]

## 2023-02-14 ENCOUNTER — Ambulatory Visit: Payer: Medicare Other | Attending: Cardiology | Admitting: Cardiology

## 2023-02-14 ENCOUNTER — Encounter: Payer: Self-pay | Admitting: Cardiology

## 2023-02-14 VITALS — BP 100/70 | HR 62 | Ht 74.0 in | Wt 174.0 lb

## 2023-02-14 DIAGNOSIS — I480 Paroxysmal atrial fibrillation: Secondary | ICD-10-CM | POA: Diagnosis not present

## 2023-02-14 DIAGNOSIS — Z7901 Long term (current) use of anticoagulants: Secondary | ICD-10-CM | POA: Insufficient documentation

## 2023-02-14 DIAGNOSIS — I5042 Chronic combined systolic (congestive) and diastolic (congestive) heart failure: Secondary | ICD-10-CM | POA: Insufficient documentation

## 2023-02-14 DIAGNOSIS — I25810 Atherosclerosis of coronary artery bypass graft(s) without angina pectoris: Secondary | ICD-10-CM | POA: Diagnosis not present

## 2023-02-14 DIAGNOSIS — E785 Hyperlipidemia, unspecified: Secondary | ICD-10-CM | POA: Diagnosis not present

## 2023-02-14 DIAGNOSIS — I951 Orthostatic hypotension: Secondary | ICD-10-CM | POA: Diagnosis not present

## 2023-02-14 DIAGNOSIS — I110 Hypertensive heart disease with heart failure: Secondary | ICD-10-CM | POA: Diagnosis not present

## 2023-02-14 DIAGNOSIS — I7789 Other specified disorders of arteries and arterioles: Secondary | ICD-10-CM | POA: Diagnosis not present

## 2023-02-14 NOTE — Patient Instructions (Signed)
Medication Instructions:  Your physician recommends that you continue on your current medications as directed. Please refer to the Current Medication list given to you today.  *If you need a refill on your cardiac medications before your next appointment, please call your pharmacy*   Lab Work: Your physician recommends that you return for lab work in:   Labs today: CMP, Lipids, CBC   If you have labs (blood work) drawn today and your tests are completely normal, you will receive your results only by: MyChart Message (if you have MyChart) OR A paper copy in the mail If you have any lab test that is abnormal or we need to change your treatment, we will call you to review the results.   Testing/Procedures: None   Follow-Up: At [facility], you and your health needs are our priority.  As part of our continuing mission to provide you with exceptional heart care, we have created designated Provider Care Teams.  These Care Teams include your primary Cardiologist (physician) and Advanced Practice Providers (APPs -  Physician Assistants and Nurse Practitioners) who all work together to provide you with the care you need, when you need it.  We recommend signing up for the patient portal called "MyChart".  Sign up information is provided on this After Visit Summary.  MyChart is used to connect with patients for Virtual Visits (Telemedicine).  Patients are able to view lab/test results, encounter notes, upcoming appointments, etc.  Non-urgent messages can be sent to your provider as well.   To learn more about what you can do with MyChart, go to [email or web address].    Your next appointment:   6 month(s)  Provider:   Brian Munley, MD    Other Instructions None  

## 2023-02-15 ENCOUNTER — Telehealth: Payer: Self-pay

## 2023-02-15 NOTE — Telephone Encounter (Signed)
-----   Message from [facility] sent at 02/15/2023  7:50 AM EDT ----- Normal or stable result  All were good no changes

## 2023-02-15 NOTE — Telephone Encounter (Signed)
Patient notified through my chart.

## 2023-03-04 ENCOUNTER — Other Ambulatory Visit: Payer: Self-pay | Admitting: Neurology

## 2023-03-07 NOTE — Telephone Encounter (Signed)
Last seen on 11/09/21 Now follow up scheduled

## 2023-05-12 DIAGNOSIS — Z23 Encounter for immunization: Secondary | ICD-10-CM | POA: Diagnosis not present

## 2023-06-04 DIAGNOSIS — I10 Essential (primary) hypertension: Secondary | ICD-10-CM | POA: Diagnosis not present

## 2023-06-04 DIAGNOSIS — D500 Iron deficiency anemia secondary to blood loss (chronic): Secondary | ICD-10-CM | POA: Diagnosis present

## 2023-06-04 DIAGNOSIS — R41841 Cognitive communication deficit: Secondary | ICD-10-CM | POA: Diagnosis present

## 2023-06-04 DIAGNOSIS — I498 Other specified cardiac arrhythmias: Secondary | ICD-10-CM | POA: Diagnosis not present

## 2023-06-04 DIAGNOSIS — D649 Anemia, unspecified: Secondary | ICD-10-CM | POA: Diagnosis not present

## 2023-06-04 DIAGNOSIS — K219 Gastro-esophageal reflux disease without esophagitis: Secondary | ICD-10-CM | POA: Diagnosis not present

## 2023-06-04 DIAGNOSIS — D509 Iron deficiency anemia, unspecified: Secondary | ICD-10-CM | POA: Diagnosis not present

## 2023-06-04 DIAGNOSIS — K5791 Diverticulosis of intestine, part unspecified, without perforation or abscess with bleeding: Secondary | ICD-10-CM | POA: Diagnosis not present

## 2023-06-04 DIAGNOSIS — R531 Weakness: Secondary | ICD-10-CM | POA: Diagnosis present

## 2023-06-04 DIAGNOSIS — I6389 Other cerebral infarction: Secondary | ICD-10-CM | POA: Diagnosis present

## 2023-06-04 DIAGNOSIS — E785 Hyperlipidemia, unspecified: Secondary | ICD-10-CM | POA: Diagnosis not present

## 2023-06-04 DIAGNOSIS — Z888 Allergy status to other drugs, medicaments and biological substances status: Secondary | ICD-10-CM | POA: Diagnosis not present

## 2023-06-04 DIAGNOSIS — K5790 Diverticulosis of intestine, part unspecified, without perforation or abscess without bleeding: Secondary | ICD-10-CM | POA: Diagnosis not present

## 2023-06-04 DIAGNOSIS — Z882 Allergy status to sulfonamides status: Secondary | ICD-10-CM | POA: Diagnosis not present

## 2023-06-04 DIAGNOSIS — K648 Other hemorrhoids: Secondary | ICD-10-CM | POA: Diagnosis not present

## 2023-06-04 DIAGNOSIS — E441 Mild protein-calorie malnutrition: Secondary | ICD-10-CM | POA: Diagnosis present

## 2023-06-04 DIAGNOSIS — Z79899 Other long term (current) drug therapy: Secondary | ICD-10-CM | POA: Diagnosis not present

## 2023-06-04 DIAGNOSIS — D62 Acute posthemorrhagic anemia: Secondary | ICD-10-CM | POA: Diagnosis not present

## 2023-06-04 DIAGNOSIS — R2689 Other abnormalities of gait and mobility: Secondary | ICD-10-CM | POA: Diagnosis present

## 2023-06-04 DIAGNOSIS — M6281 Muscle weakness (generalized): Secondary | ICD-10-CM | POA: Diagnosis present

## 2023-06-04 DIAGNOSIS — M6259 Muscle wasting and atrophy, not elsewhere classified, multiple sites: Secondary | ICD-10-CM | POA: Diagnosis present

## 2023-06-04 DIAGNOSIS — I252 Old myocardial infarction: Secondary | ICD-10-CM | POA: Diagnosis not present

## 2023-06-04 DIAGNOSIS — K922 Gastrointestinal hemorrhage, unspecified: Secondary | ICD-10-CM | POA: Diagnosis not present

## 2023-06-04 DIAGNOSIS — N3943 Post-void dribbling: Secondary | ICD-10-CM | POA: Diagnosis not present

## 2023-06-04 DIAGNOSIS — I2510 Atherosclerotic heart disease of native coronary artery without angina pectoris: Secondary | ICD-10-CM | POA: Diagnosis present

## 2023-06-04 DIAGNOSIS — I480 Paroxysmal atrial fibrillation: Secondary | ICD-10-CM | POA: Diagnosis present

## 2023-06-04 DIAGNOSIS — I959 Hypotension, unspecified: Secondary | ICD-10-CM | POA: Diagnosis not present

## 2023-06-04 DIAGNOSIS — I482 Chronic atrial fibrillation, unspecified: Secondary | ICD-10-CM | POA: Diagnosis not present

## 2023-06-04 DIAGNOSIS — Z7901 Long term (current) use of anticoagulants: Secondary | ICD-10-CM | POA: Diagnosis not present

## 2023-06-04 DIAGNOSIS — R1084 Generalized abdominal pain: Secondary | ICD-10-CM | POA: Diagnosis not present

## 2023-06-04 DIAGNOSIS — Z743 Need for continuous supervision: Secondary | ICD-10-CM | POA: Diagnosis not present

## 2023-06-04 DIAGNOSIS — R2681 Unsteadiness on feet: Secondary | ICD-10-CM | POA: Diagnosis present

## 2023-06-04 DIAGNOSIS — Z8673 Personal history of transient ischemic attack (TIA), and cerebral infarction without residual deficits: Secondary | ICD-10-CM | POA: Diagnosis not present

## 2023-06-04 DIAGNOSIS — R58 Hemorrhage, not elsewhere classified: Secondary | ICD-10-CM | POA: Diagnosis not present

## 2023-06-04 DIAGNOSIS — F05 Delirium due to known physiological condition: Secondary | ICD-10-CM | POA: Diagnosis not present

## 2023-06-09 DIAGNOSIS — E441 Mild protein-calorie malnutrition: Secondary | ICD-10-CM | POA: Diagnosis not present

## 2023-06-09 DIAGNOSIS — Z743 Need for continuous supervision: Secondary | ICD-10-CM | POA: Diagnosis not present

## 2023-06-09 DIAGNOSIS — R5381 Other malaise: Secondary | ICD-10-CM | POA: Diagnosis not present

## 2023-06-09 DIAGNOSIS — I10 Essential (primary) hypertension: Secondary | ICD-10-CM | POA: Diagnosis not present

## 2023-06-09 DIAGNOSIS — R531 Weakness: Secondary | ICD-10-CM | POA: Diagnosis not present

## 2023-06-09 DIAGNOSIS — D62 Acute posthemorrhagic anemia: Secondary | ICD-10-CM | POA: Diagnosis not present

## 2023-06-09 DIAGNOSIS — R2681 Unsteadiness on feet: Secondary | ICD-10-CM | POA: Diagnosis not present

## 2023-06-09 DIAGNOSIS — R2689 Other abnormalities of gait and mobility: Secondary | ICD-10-CM | POA: Diagnosis not present

## 2023-06-09 DIAGNOSIS — I959 Hypotension, unspecified: Secondary | ICD-10-CM | POA: Diagnosis not present

## 2023-06-09 DIAGNOSIS — M6281 Muscle weakness (generalized): Secondary | ICD-10-CM | POA: Diagnosis not present

## 2023-06-09 DIAGNOSIS — I2510 Atherosclerotic heart disease of native coronary artery without angina pectoris: Secondary | ICD-10-CM | POA: Diagnosis not present

## 2023-06-09 DIAGNOSIS — R41841 Cognitive communication deficit: Secondary | ICD-10-CM | POA: Diagnosis not present

## 2023-06-09 DIAGNOSIS — M6259 Muscle wasting and atrophy, not elsewhere classified, multiple sites: Secondary | ICD-10-CM | POA: Diagnosis not present

## 2023-06-09 DIAGNOSIS — Z7189 Other specified counseling: Secondary | ICD-10-CM | POA: Diagnosis not present

## 2023-06-09 DIAGNOSIS — K922 Gastrointestinal hemorrhage, unspecified: Secondary | ICD-10-CM | POA: Diagnosis not present

## 2023-06-09 DIAGNOSIS — K648 Other hemorrhoids: Secondary | ICD-10-CM | POA: Diagnosis not present

## 2023-06-09 DIAGNOSIS — I6389 Other cerebral infarction: Secondary | ICD-10-CM | POA: Diagnosis not present

## 2023-06-09 DIAGNOSIS — D500 Iron deficiency anemia secondary to blood loss (chronic): Secondary | ICD-10-CM | POA: Diagnosis not present

## 2023-06-09 DIAGNOSIS — R58 Hemorrhage, not elsewhere classified: Secondary | ICD-10-CM | POA: Diagnosis not present

## 2023-06-09 DIAGNOSIS — K219 Gastro-esophageal reflux disease without esophagitis: Secondary | ICD-10-CM | POA: Diagnosis not present

## 2023-06-09 DIAGNOSIS — I480 Paroxysmal atrial fibrillation: Secondary | ICD-10-CM | POA: Diagnosis not present

## 2023-06-09 DIAGNOSIS — K5790 Diverticulosis of intestine, part unspecified, without perforation or abscess without bleeding: Secondary | ICD-10-CM | POA: Diagnosis not present

## 2023-06-13 DIAGNOSIS — K922 Gastrointestinal hemorrhage, unspecified: Secondary | ICD-10-CM | POA: Diagnosis not present

## 2023-06-13 DIAGNOSIS — R5381 Other malaise: Secondary | ICD-10-CM | POA: Diagnosis not present

## 2023-06-14 DIAGNOSIS — Z7189 Other specified counseling: Secondary | ICD-10-CM | POA: Diagnosis not present

## 2023-06-14 DIAGNOSIS — R5381 Other malaise: Secondary | ICD-10-CM | POA: Diagnosis not present

## 2023-06-14 DIAGNOSIS — K922 Gastrointestinal hemorrhage, unspecified: Secondary | ICD-10-CM | POA: Diagnosis not present

## 2023-06-19 DIAGNOSIS — I959 Hypotension, unspecified: Secondary | ICD-10-CM | POA: Diagnosis not present

## 2023-06-22 DIAGNOSIS — I959 Hypotension, unspecified: Secondary | ICD-10-CM | POA: Diagnosis not present

## 2023-06-22 DIAGNOSIS — R5381 Other malaise: Secondary | ICD-10-CM | POA: Diagnosis not present

## 2023-06-22 DIAGNOSIS — K922 Gastrointestinal hemorrhage, unspecified: Secondary | ICD-10-CM | POA: Diagnosis not present

## 2023-06-26 DIAGNOSIS — E785 Hyperlipidemia, unspecified: Secondary | ICD-10-CM | POA: Diagnosis not present

## 2023-06-26 DIAGNOSIS — I700 Atherosclerosis of aorta: Secondary | ICD-10-CM | POA: Diagnosis not present

## 2023-06-26 DIAGNOSIS — I491 Atrial premature depolarization: Secondary | ICD-10-CM | POA: Diagnosis not present

## 2023-06-26 DIAGNOSIS — K219 Gastro-esophageal reflux disease without esophagitis: Secondary | ICD-10-CM | POA: Diagnosis not present

## 2023-06-26 DIAGNOSIS — K5790 Diverticulosis of intestine, part unspecified, without perforation or abscess without bleeding: Secondary | ICD-10-CM | POA: Diagnosis not present

## 2023-06-26 DIAGNOSIS — I130 Hypertensive heart and chronic kidney disease with heart failure and stage 1 through stage 4 chronic kidney disease, or unspecified chronic kidney disease: Secondary | ICD-10-CM | POA: Diagnosis not present

## 2023-06-26 DIAGNOSIS — J392 Other diseases of pharynx: Secondary | ICD-10-CM | POA: Diagnosis not present

## 2023-06-26 DIAGNOSIS — N138 Other obstructive and reflux uropathy: Secondary | ICD-10-CM | POA: Diagnosis not present

## 2023-06-26 DIAGNOSIS — Z8673 Personal history of transient ischemic attack (TIA), and cerebral infarction without residual deficits: Secondary | ICD-10-CM | POA: Diagnosis not present

## 2023-06-26 DIAGNOSIS — N1832 Chronic kidney disease, stage 3b: Secondary | ICD-10-CM | POA: Diagnosis not present

## 2023-06-26 DIAGNOSIS — I480 Paroxysmal atrial fibrillation: Secondary | ICD-10-CM | POA: Diagnosis not present

## 2023-06-26 DIAGNOSIS — I5042 Chronic combined systolic (congestive) and diastolic (congestive) heart failure: Secondary | ICD-10-CM | POA: Diagnosis not present

## 2023-06-26 DIAGNOSIS — D508 Other iron deficiency anemias: Secondary | ICD-10-CM | POA: Diagnosis not present

## 2023-06-26 DIAGNOSIS — I7121 Aneurysm of the ascending aorta, without rupture: Secondary | ICD-10-CM | POA: Diagnosis not present

## 2023-06-26 DIAGNOSIS — I2510 Atherosclerotic heart disease of native coronary artery without angina pectoris: Secondary | ICD-10-CM | POA: Diagnosis not present

## 2023-06-26 DIAGNOSIS — D6869 Other thrombophilia: Secondary | ICD-10-CM | POA: Diagnosis not present

## 2023-06-26 DIAGNOSIS — Z85828 Personal history of other malignant neoplasm of skin: Secondary | ICD-10-CM | POA: Diagnosis not present

## 2023-06-26 DIAGNOSIS — F40240 Claustrophobia: Secondary | ICD-10-CM | POA: Diagnosis not present

## 2023-06-26 DIAGNOSIS — H9110 Presbycusis, unspecified ear: Secondary | ICD-10-CM | POA: Diagnosis not present

## 2023-06-26 DIAGNOSIS — I083 Combined rheumatic disorders of mitral, aortic and tricuspid valves: Secondary | ICD-10-CM | POA: Diagnosis not present

## 2023-06-26 DIAGNOSIS — M1990 Unspecified osteoarthritis, unspecified site: Secondary | ICD-10-CM | POA: Diagnosis not present

## 2023-06-26 DIAGNOSIS — Z951 Presence of aortocoronary bypass graft: Secondary | ICD-10-CM | POA: Diagnosis not present

## 2023-06-26 DIAGNOSIS — I252 Old myocardial infarction: Secondary | ICD-10-CM | POA: Diagnosis not present

## 2023-06-26 DIAGNOSIS — I672 Cerebral atherosclerosis: Secondary | ICD-10-CM | POA: Diagnosis not present

## 2023-06-26 DIAGNOSIS — N401 Enlarged prostate with lower urinary tract symptoms: Secondary | ICD-10-CM | POA: Diagnosis not present

## 2023-06-28 ENCOUNTER — Other Ambulatory Visit: Payer: Self-pay | Admitting: Neurology

## 2023-06-28 DIAGNOSIS — I480 Paroxysmal atrial fibrillation: Secondary | ICD-10-CM | POA: Diagnosis not present

## 2023-06-28 DIAGNOSIS — N1832 Chronic kidney disease, stage 3b: Secondary | ICD-10-CM | POA: Diagnosis not present

## 2023-06-28 DIAGNOSIS — K5790 Diverticulosis of intestine, part unspecified, without perforation or abscess without bleeding: Secondary | ICD-10-CM | POA: Diagnosis not present

## 2023-06-28 DIAGNOSIS — D6869 Other thrombophilia: Secondary | ICD-10-CM | POA: Diagnosis not present

## 2023-06-28 DIAGNOSIS — I130 Hypertensive heart and chronic kidney disease with heart failure and stage 1 through stage 4 chronic kidney disease, or unspecified chronic kidney disease: Secondary | ICD-10-CM | POA: Diagnosis not present

## 2023-06-28 DIAGNOSIS — I5042 Chronic combined systolic (congestive) and diastolic (congestive) heart failure: Secondary | ICD-10-CM | POA: Diagnosis not present

## 2023-06-28 NOTE — Telephone Encounter (Signed)
Last seen on 11/09/21 No follow up scheduled  Last filled 06/22/23

## 2023-06-29 DIAGNOSIS — D6869 Other thrombophilia: Secondary | ICD-10-CM | POA: Diagnosis not present

## 2023-06-29 DIAGNOSIS — I130 Hypertensive heart and chronic kidney disease with heart failure and stage 1 through stage 4 chronic kidney disease, or unspecified chronic kidney disease: Secondary | ICD-10-CM | POA: Diagnosis not present

## 2023-06-29 DIAGNOSIS — N1832 Chronic kidney disease, stage 3b: Secondary | ICD-10-CM | POA: Diagnosis not present

## 2023-06-29 DIAGNOSIS — I480 Paroxysmal atrial fibrillation: Secondary | ICD-10-CM | POA: Diagnosis not present

## 2023-06-29 DIAGNOSIS — I5042 Chronic combined systolic (congestive) and diastolic (congestive) heart failure: Secondary | ICD-10-CM | POA: Diagnosis not present

## 2023-06-29 DIAGNOSIS — K5790 Diverticulosis of intestine, part unspecified, without perforation or abscess without bleeding: Secondary | ICD-10-CM | POA: Diagnosis not present

## 2023-07-05 DIAGNOSIS — I130 Hypertensive heart and chronic kidney disease with heart failure and stage 1 through stage 4 chronic kidney disease, or unspecified chronic kidney disease: Secondary | ICD-10-CM | POA: Diagnosis not present

## 2023-07-05 DIAGNOSIS — D6869 Other thrombophilia: Secondary | ICD-10-CM | POA: Diagnosis not present

## 2023-07-05 DIAGNOSIS — K5790 Diverticulosis of intestine, part unspecified, without perforation or abscess without bleeding: Secondary | ICD-10-CM | POA: Diagnosis not present

## 2023-07-05 DIAGNOSIS — N1832 Chronic kidney disease, stage 3b: Secondary | ICD-10-CM | POA: Diagnosis not present

## 2023-07-05 DIAGNOSIS — I480 Paroxysmal atrial fibrillation: Secondary | ICD-10-CM | POA: Diagnosis not present

## 2023-07-05 DIAGNOSIS — I5042 Chronic combined systolic (congestive) and diastolic (congestive) heart failure: Secondary | ICD-10-CM | POA: Diagnosis not present

## 2023-07-06 DIAGNOSIS — I480 Paroxysmal atrial fibrillation: Secondary | ICD-10-CM | POA: Diagnosis not present

## 2023-07-06 DIAGNOSIS — D508 Other iron deficiency anemias: Secondary | ICD-10-CM | POA: Diagnosis not present

## 2023-07-06 DIAGNOSIS — I951 Orthostatic hypotension: Secondary | ICD-10-CM | POA: Diagnosis not present

## 2023-07-06 DIAGNOSIS — D6869 Other thrombophilia: Secondary | ICD-10-CM | POA: Diagnosis not present

## 2023-07-07 DIAGNOSIS — I5042 Chronic combined systolic (congestive) and diastolic (congestive) heart failure: Secondary | ICD-10-CM | POA: Diagnosis not present

## 2023-07-07 DIAGNOSIS — I130 Hypertensive heart and chronic kidney disease with heart failure and stage 1 through stage 4 chronic kidney disease, or unspecified chronic kidney disease: Secondary | ICD-10-CM | POA: Diagnosis not present

## 2023-07-07 DIAGNOSIS — I480 Paroxysmal atrial fibrillation: Secondary | ICD-10-CM | POA: Diagnosis not present

## 2023-07-07 DIAGNOSIS — N1832 Chronic kidney disease, stage 3b: Secondary | ICD-10-CM | POA: Diagnosis not present

## 2023-07-07 DIAGNOSIS — K5790 Diverticulosis of intestine, part unspecified, without perforation or abscess without bleeding: Secondary | ICD-10-CM | POA: Diagnosis not present

## 2023-07-07 DIAGNOSIS — D6869 Other thrombophilia: Secondary | ICD-10-CM | POA: Diagnosis not present

## 2023-07-11 DIAGNOSIS — I480 Paroxysmal atrial fibrillation: Secondary | ICD-10-CM | POA: Diagnosis not present

## 2023-07-11 DIAGNOSIS — D6869 Other thrombophilia: Secondary | ICD-10-CM | POA: Diagnosis not present

## 2023-07-11 DIAGNOSIS — N1832 Chronic kidney disease, stage 3b: Secondary | ICD-10-CM | POA: Diagnosis not present

## 2023-07-11 DIAGNOSIS — K5790 Diverticulosis of intestine, part unspecified, without perforation or abscess without bleeding: Secondary | ICD-10-CM | POA: Diagnosis not present

## 2023-07-11 DIAGNOSIS — I5042 Chronic combined systolic (congestive) and diastolic (congestive) heart failure: Secondary | ICD-10-CM | POA: Diagnosis not present

## 2023-07-11 DIAGNOSIS — I130 Hypertensive heart and chronic kidney disease with heart failure and stage 1 through stage 4 chronic kidney disease, or unspecified chronic kidney disease: Secondary | ICD-10-CM | POA: Diagnosis not present

## 2023-07-13 DIAGNOSIS — N1832 Chronic kidney disease, stage 3b: Secondary | ICD-10-CM | POA: Diagnosis not present

## 2023-07-13 DIAGNOSIS — I5042 Chronic combined systolic (congestive) and diastolic (congestive) heart failure: Secondary | ICD-10-CM | POA: Diagnosis not present

## 2023-07-13 DIAGNOSIS — I130 Hypertensive heart and chronic kidney disease with heart failure and stage 1 through stage 4 chronic kidney disease, or unspecified chronic kidney disease: Secondary | ICD-10-CM | POA: Diagnosis not present

## 2023-07-13 DIAGNOSIS — K5790 Diverticulosis of intestine, part unspecified, without perforation or abscess without bleeding: Secondary | ICD-10-CM | POA: Diagnosis not present

## 2023-07-13 DIAGNOSIS — D6869 Other thrombophilia: Secondary | ICD-10-CM | POA: Diagnosis not present

## 2023-07-13 DIAGNOSIS — I480 Paroxysmal atrial fibrillation: Secondary | ICD-10-CM | POA: Diagnosis not present

## 2023-07-17 DIAGNOSIS — I130 Hypertensive heart and chronic kidney disease with heart failure and stage 1 through stage 4 chronic kidney disease, or unspecified chronic kidney disease: Secondary | ICD-10-CM | POA: Diagnosis not present

## 2023-07-17 DIAGNOSIS — I480 Paroxysmal atrial fibrillation: Secondary | ICD-10-CM | POA: Diagnosis not present

## 2023-07-18 DIAGNOSIS — D6869 Other thrombophilia: Secondary | ICD-10-CM | POA: Diagnosis not present

## 2023-07-18 DIAGNOSIS — I5042 Chronic combined systolic (congestive) and diastolic (congestive) heart failure: Secondary | ICD-10-CM | POA: Diagnosis not present

## 2023-07-18 DIAGNOSIS — K5790 Diverticulosis of intestine, part unspecified, without perforation or abscess without bleeding: Secondary | ICD-10-CM | POA: Diagnosis not present

## 2023-07-18 DIAGNOSIS — I480 Paroxysmal atrial fibrillation: Secondary | ICD-10-CM | POA: Diagnosis not present

## 2023-07-18 DIAGNOSIS — I130 Hypertensive heart and chronic kidney disease with heart failure and stage 1 through stage 4 chronic kidney disease, or unspecified chronic kidney disease: Secondary | ICD-10-CM | POA: Diagnosis not present

## 2023-07-18 DIAGNOSIS — N1832 Chronic kidney disease, stage 3b: Secondary | ICD-10-CM | POA: Diagnosis not present

## 2023-07-20 DIAGNOSIS — K922 Gastrointestinal hemorrhage, unspecified: Secondary | ICD-10-CM | POA: Diagnosis not present

## 2023-07-20 DIAGNOSIS — D509 Iron deficiency anemia, unspecified: Secondary | ICD-10-CM | POA: Diagnosis not present

## 2023-07-20 DIAGNOSIS — K219 Gastro-esophageal reflux disease without esophagitis: Secondary | ICD-10-CM | POA: Diagnosis not present

## 2023-07-20 DIAGNOSIS — K5790 Diverticulosis of intestine, part unspecified, without perforation or abscess without bleeding: Secondary | ICD-10-CM | POA: Diagnosis not present

## 2023-07-21 DIAGNOSIS — I480 Paroxysmal atrial fibrillation: Secondary | ICD-10-CM | POA: Diagnosis not present

## 2023-07-21 DIAGNOSIS — N1832 Chronic kidney disease, stage 3b: Secondary | ICD-10-CM | POA: Diagnosis not present

## 2023-07-21 DIAGNOSIS — I130 Hypertensive heart and chronic kidney disease with heart failure and stage 1 through stage 4 chronic kidney disease, or unspecified chronic kidney disease: Secondary | ICD-10-CM | POA: Diagnosis not present

## 2023-07-21 DIAGNOSIS — K5790 Diverticulosis of intestine, part unspecified, without perforation or abscess without bleeding: Secondary | ICD-10-CM | POA: Diagnosis not present

## 2023-07-21 DIAGNOSIS — I5042 Chronic combined systolic (congestive) and diastolic (congestive) heart failure: Secondary | ICD-10-CM | POA: Diagnosis not present

## 2023-07-21 DIAGNOSIS — D6869 Other thrombophilia: Secondary | ICD-10-CM | POA: Diagnosis not present

## 2023-07-25 DIAGNOSIS — N401 Enlarged prostate with lower urinary tract symptoms: Secondary | ICD-10-CM | POA: Diagnosis not present

## 2023-07-25 DIAGNOSIS — R351 Nocturia: Secondary | ICD-10-CM | POA: Diagnosis not present

## 2023-07-26 DIAGNOSIS — I2510 Atherosclerotic heart disease of native coronary artery without angina pectoris: Secondary | ICD-10-CM | POA: Diagnosis not present

## 2023-07-26 DIAGNOSIS — N1832 Chronic kidney disease, stage 3b: Secondary | ICD-10-CM | POA: Diagnosis not present

## 2023-07-26 DIAGNOSIS — I700 Atherosclerosis of aorta: Secondary | ICD-10-CM | POA: Diagnosis not present

## 2023-07-26 DIAGNOSIS — E785 Hyperlipidemia, unspecified: Secondary | ICD-10-CM | POA: Diagnosis not present

## 2023-07-26 DIAGNOSIS — N138 Other obstructive and reflux uropathy: Secondary | ICD-10-CM | POA: Diagnosis not present

## 2023-07-26 DIAGNOSIS — N401 Enlarged prostate with lower urinary tract symptoms: Secondary | ICD-10-CM | POA: Diagnosis not present

## 2023-07-26 DIAGNOSIS — I480 Paroxysmal atrial fibrillation: Secondary | ICD-10-CM | POA: Diagnosis not present

## 2023-07-26 DIAGNOSIS — J392 Other diseases of pharynx: Secondary | ICD-10-CM | POA: Diagnosis not present

## 2023-07-26 DIAGNOSIS — D508 Other iron deficiency anemias: Secondary | ICD-10-CM | POA: Diagnosis not present

## 2023-07-26 DIAGNOSIS — Z8673 Personal history of transient ischemic attack (TIA), and cerebral infarction without residual deficits: Secondary | ICD-10-CM | POA: Diagnosis not present

## 2023-07-26 DIAGNOSIS — I5042 Chronic combined systolic (congestive) and diastolic (congestive) heart failure: Secondary | ICD-10-CM | POA: Diagnosis not present

## 2023-07-26 DIAGNOSIS — D6869 Other thrombophilia: Secondary | ICD-10-CM | POA: Diagnosis not present

## 2023-07-26 DIAGNOSIS — I252 Old myocardial infarction: Secondary | ICD-10-CM | POA: Diagnosis not present

## 2023-07-26 DIAGNOSIS — I672 Cerebral atherosclerosis: Secondary | ICD-10-CM | POA: Diagnosis not present

## 2023-07-26 DIAGNOSIS — M1990 Unspecified osteoarthritis, unspecified site: Secondary | ICD-10-CM | POA: Diagnosis not present

## 2023-07-26 DIAGNOSIS — I130 Hypertensive heart and chronic kidney disease with heart failure and stage 1 through stage 4 chronic kidney disease, or unspecified chronic kidney disease: Secondary | ICD-10-CM | POA: Diagnosis not present

## 2023-07-26 DIAGNOSIS — H9110 Presbycusis, unspecified ear: Secondary | ICD-10-CM | POA: Diagnosis not present

## 2023-07-26 DIAGNOSIS — I491 Atrial premature depolarization: Secondary | ICD-10-CM | POA: Diagnosis not present

## 2023-07-26 DIAGNOSIS — Z951 Presence of aortocoronary bypass graft: Secondary | ICD-10-CM | POA: Diagnosis not present

## 2023-07-26 DIAGNOSIS — I7121 Aneurysm of the ascending aorta, without rupture: Secondary | ICD-10-CM | POA: Diagnosis not present

## 2023-07-26 DIAGNOSIS — F40240 Claustrophobia: Secondary | ICD-10-CM | POA: Diagnosis not present

## 2023-07-26 DIAGNOSIS — K5790 Diverticulosis of intestine, part unspecified, without perforation or abscess without bleeding: Secondary | ICD-10-CM | POA: Diagnosis not present

## 2023-07-26 DIAGNOSIS — I083 Combined rheumatic disorders of mitral, aortic and tricuspid valves: Secondary | ICD-10-CM | POA: Diagnosis not present

## 2023-07-26 DIAGNOSIS — K219 Gastro-esophageal reflux disease without esophagitis: Secondary | ICD-10-CM | POA: Diagnosis not present

## 2023-07-26 DIAGNOSIS — Z85828 Personal history of other malignant neoplasm of skin: Secondary | ICD-10-CM | POA: Diagnosis not present

## 2023-08-01 DIAGNOSIS — K5730 Diverticulosis of large intestine without perforation or abscess without bleeding: Secondary | ICD-10-CM | POA: Diagnosis not present

## 2023-08-01 DIAGNOSIS — D508 Other iron deficiency anemias: Secondary | ICD-10-CM | POA: Diagnosis not present

## 2023-08-01 DIAGNOSIS — Z8719 Personal history of other diseases of the digestive system: Secondary | ICD-10-CM | POA: Diagnosis not present

## 2023-08-01 DIAGNOSIS — K922 Gastrointestinal hemorrhage, unspecified: Secondary | ICD-10-CM | POA: Diagnosis not present

## 2023-08-01 DIAGNOSIS — K5790 Diverticulosis of intestine, part unspecified, without perforation or abscess without bleeding: Secondary | ICD-10-CM | POA: Diagnosis not present

## 2023-08-01 DIAGNOSIS — I10 Essential (primary) hypertension: Secondary | ICD-10-CM | POA: Diagnosis not present

## 2023-08-01 DIAGNOSIS — I480 Paroxysmal atrial fibrillation: Secondary | ICD-10-CM | POA: Diagnosis not present

## 2023-08-01 DIAGNOSIS — I5042 Chronic combined systolic (congestive) and diastolic (congestive) heart failure: Secondary | ICD-10-CM | POA: Diagnosis not present

## 2023-08-01 DIAGNOSIS — D6869 Other thrombophilia: Secondary | ICD-10-CM | POA: Diagnosis not present

## 2023-08-01 DIAGNOSIS — Z79899 Other long term (current) drug therapy: Secondary | ICD-10-CM | POA: Diagnosis not present

## 2023-08-01 DIAGNOSIS — I130 Hypertensive heart and chronic kidney disease with heart failure and stage 1 through stage 4 chronic kidney disease, or unspecified chronic kidney disease: Secondary | ICD-10-CM | POA: Diagnosis not present

## 2023-08-01 DIAGNOSIS — Z0000 Encounter for general adult medical examination without abnormal findings: Secondary | ICD-10-CM | POA: Diagnosis not present

## 2023-08-01 DIAGNOSIS — E785 Hyperlipidemia, unspecified: Secondary | ICD-10-CM | POA: Diagnosis not present

## 2023-08-01 DIAGNOSIS — N1832 Chronic kidney disease, stage 3b: Secondary | ICD-10-CM | POA: Diagnosis not present

## 2023-08-03 DIAGNOSIS — H6123 Impacted cerumen, bilateral: Secondary | ICD-10-CM | POA: Diagnosis not present

## 2023-08-03 DIAGNOSIS — H903 Sensorineural hearing loss, bilateral: Secondary | ICD-10-CM | POA: Diagnosis not present

## 2023-08-08 DIAGNOSIS — K5790 Diverticulosis of intestine, part unspecified, without perforation or abscess without bleeding: Secondary | ICD-10-CM | POA: Diagnosis not present

## 2023-08-08 DIAGNOSIS — N1832 Chronic kidney disease, stage 3b: Secondary | ICD-10-CM | POA: Diagnosis not present

## 2023-08-08 DIAGNOSIS — D6869 Other thrombophilia: Secondary | ICD-10-CM | POA: Diagnosis not present

## 2023-08-08 DIAGNOSIS — I130 Hypertensive heart and chronic kidney disease with heart failure and stage 1 through stage 4 chronic kidney disease, or unspecified chronic kidney disease: Secondary | ICD-10-CM | POA: Diagnosis not present

## 2023-08-08 DIAGNOSIS — I5042 Chronic combined systolic (congestive) and diastolic (congestive) heart failure: Secondary | ICD-10-CM | POA: Diagnosis not present

## 2023-08-08 DIAGNOSIS — I480 Paroxysmal atrial fibrillation: Secondary | ICD-10-CM | POA: Diagnosis not present

## 2023-08-12 ENCOUNTER — Other Ambulatory Visit: Payer: Self-pay | Admitting: Cardiology

## 2023-08-14 NOTE — Telephone Encounter (Signed)
Prescription refill request for Eliquis received. Indication:afib Last office visit:7/24 Scr:0.80   Age: 89 Weight:78.9  kg  Prescription refilled

## 2023-08-15 ENCOUNTER — Encounter: Payer: Self-pay | Admitting: Cardiology

## 2023-08-16 NOTE — Progress Notes (Unsigned)
Cardiology Office Note:    Date:  08/16/2023   ID:  Joshua Chambers, Joshua Chambers Feb 21, 1923, MRN [id number]  PCP:  Street, Joshua Coup, MD  Cardiologist:  Joshua Herrlich, MD    Referring MD: [address] Joshua Chambers, *    ASSESSMENT:    1. Orthostatic hypotension   2. Paroxysmal atrial fibrillation (HCC)   3. Chronic anticoagulation   4. Coronary artery disease involving coronary bypass graft of native heart without angina pectoris; CAD (coronary artery disease) of artery bypass graft:  loss of VG to RCA and VG-Diag   5. Hypertensive heart disease with chronic combined systolic and diastolic congestive heart failure (HCC)   6. Dyslipidemia, goal LDL below 70   7. Ascending aorta enlargement (HCC)    PLAN:    In order of problems listed above:  Despite his recent GI bleed and anemia Joshua Chambers is doing well We will continue his current midodrine 10 mg 4 times daily and Florinef 0.1 mg daily Maintaining sinus rhythm without an antiarrhythmic drug and continue his reduced dose anticoagulant Stable CAD having no anginal discomfort continue his medical treatment including his high intensity statin Lipitor 20 mg daily and his beta-blocker metoprolol 12 and half milligrams per day He will continue his current statin LDL is at target He has moderate enlargement in his age group I would stop imaging as we would not intervene electively.   Next appointment: 6 months   Medication Adjustments/Labs and Tests Ordered: Current medicines are reviewed at length with the patient today.  Concerns regarding medicines are outlined above.  No orders of the defined types were placed in this encounter.  No orders of the defined types were placed in this encounter.    History of Present Illness:    Joshua Chambers is a 89 y.o. male with a hx of very complex heart disease including CAD with remote CABG 2007 hypertensive heart disease complicated by symptomatic orthostatic hypotension requiring midodrine therapy  for blood pressure support paroxysmal atrial fibrillation with TIA and recurrent GI bleeding and enlargement of the ascending aorta 44 mm last seen 02/14/2023.  Compliance with diet, lifestyle and medications: Yes  His son is present actively involved in his care. Do not have records but he tells me he was at [facility] the end of November GI bleed diverticular stop bleeding spontaneously was transfused and afterwards takes oral iron and his hemoglobin is improved.  Recent labs 08/01/2023 hemoglobin 10.8 creatinine 0.7 cholesterol 147 LDL 73 With constipation he is switched to over-the-counter chewable vitamin with iron  Her cardiology perspective doing well he is not having hypotension which was very bothersome in the past and is not having edema shortness of breath chest pain palpitation or syncope He is back on his anticoagulant without recurrent bleeding  Lab Results  Component Value Date   HGB 10.5 (L) 02/14/2023    Past Medical History:  Diagnosis Date   Arteriosclerosis of arterial coronary artery bypass graft    Arthritis    Atrial contractions, premature    Atrial fibrillation (HCC)    Benign hypertension    Bilateral carotid artery stenosis    CAD (coronary artery disease), autologous vein bypass graft 04/19/2013   100% flush occlusion of SVG-OM & SVG-Diag; near Subtotal Occlusion of SVG-RCA (PCI of RCA done to avoid distal embolization with PCI to SVG)   CAD (coronary artery disease), native coronary artery 2007   Multivessel CAD referred for CABG   CAD S/P percutaneous coronary angioplasty 04/19/2013  PCI of Native RCA - Promus Premier DES 3.0 mm x 38 mm (3.6 mm);    Chronic anticoagulation 04/17/2018   Chronic combined systolic and diastolic heart failure (HCC)    Chronic left shoulder pain 11/09/2021   Coronary artery disease involving coronary bypass graft of native heart without angina pectoris; CAD (coronary artery disease) of artery bypass graft:  loss of VG  to RCA and VG-Diag 04/17/2013   Dyslipidemia    Dyslipidemia, goal LDL below 70    Dyspnea 04/30/2013   Essential hypertension 05/02/2013   Fatigue 04/17/2013   GERD (gastroesophageal reflux disease)    Hematuria 05/02/2013   History of DVT of lower extremity    Right popliteal vein   History of stroke 02/2010   Possible TIA in 2001;  Echo: EF 45-50%, mild HK of anterior septum.   History of transient cerebral ischemia    Hx of orthostatic hypotension 2009   Positive tilt table:  dizziness/vasopressor type; previously on the decision    Hx SBO    Ischemic cardiomyopathy 02/06/2018   Left arm numbness 12/29/2020   Nephrolithiasis    Orthostatic hypotension    Osteoarthritis of left glenohumeral joint 11/09/2021   Other and unspecified coagulation defects 04/17/2013   PAF (paroxysmal atrial fibrillation) (HCC) 02/06/2018   Paroxysmal atrial fibrillation (HCC) 12/29/2020   Premature atrial contractions 07/28/2015   Preoperative examination 04/17/2013   IMO SNOMED Dx Update Oct 2024     Preoperative examination, unspecified 04/17/2013   S/P CABG x 3 2007   LIMA-LAD, SVG-DIAG, SVG-RCA   S/P CABG x 4; LIMA-LAD, SVG-RPDA, SVG-OM, SVG-D1 -- all but LIMA-LAD occluded 03/18/2006   03/2006 - LIMA-LAD, SVG-D1, SVG-RPDA    Sinus arrhythmia 09/09/2016   Stroke [facility])    TIA (transient ischemic attack) 03/16/2017   Unsteady gait     Current Medications: Current Meds  Medication Sig   dutasteride (AVODART) 0.5 MG capsule Take 0.5 mg by mouth daily.   tamsulosin (FLOMAX) 0.4 MG CAPS capsule Take 0.4 mg by mouth daily.      EKGs/Labs/Other Studies Reviewed:    The following studies were reviewed today:  Cardiac Studies & Procedures      ECHOCARDIOGRAM  ECHOCARDIOGRAM COMPLETE 03/17/2017  Narrative *[place]* *[facility]* [address] [place], [place]  [address] [phone]  ------------------------------------------------------------------- Transthoracic Echocardiography  Patient:    Joshua Chambers MR #:       [id number] Study Date: 03/17/2017 Gender:     M Age:        89 Height:     188 cm Weight:     88.9 kg BSA:        2.16 m^2 Pt. Status: Room:       5M14C  ADMITTING    Rai, Ripudeep K ORDERING     Rai, Ripudeep K REFERRING    Rai, Ripudeep K PERFORMING   [facility], Inpatient ATTENDING    Little, Ambrose Finland SONOGRAPHER  Dance, Tiffany  cc:  ------------------------------------------------------------------- LV EF: 50% -   55%  ------------------------------------------------------------------- Indications:      TIA 435.9.  ------------------------------------------------------------------- History:   PMH:   Coronary artery disease.  Stroke.  Risk factors: Dyslipidemia.  ------------------------------------------------------------------- Study Conclusions  - Left ventricle: Poor acoustic windows limit study LVEF is approximately 50 to 55% with mild inferior, inferoseptal hypokinesis. The cavity size was normal. Wall thickness was normal. Systolic function was normal. The estimated ejection fraction was in the range of 50% to 55%. - Aortic valve:  There was trivial regurgitation. - Mitral valve: There was mild regurgitation.  ------------------------------------------------------------------- Labs, prior tests, procedures, and surgery: Coronary artery bypass grafting.  ------------------------------------------------------------------- Study data:  No prior study was available for comparison.  Study status:  Routine.  Procedure:  The patient reported no pain pre or post test. Transthoracic echocardiography. Image quality was adequate.  Study completion:  There were no complications. Transthoracic echocardiography.  M-mode, complete 2D, spectral Doppler, and color Doppler.  Birthdate:  Patient  birthdate: July 14, 1923.  Age:  Patient is 89 yr old.  Sex:  Gender: male. BMI: 25.2 kg/m^2.  Blood pressure:     156/92  Patient status: Inpatient.  Study date:  Study date: 03/17/2017. Study time: 12:51 PM.  Location:  Bedside.  -------------------------------------------------------------------  ------------------------------------------------------------------- Left ventricle:  Poor acoustic windows limit study LVEF is approximately 50 to 55% with mild inferior, inferoseptal hypokinesis. The cavity size was normal. Wall thickness was normal. Systolic function was normal. The estimated ejection fraction was in the range of 50% to 55%.  ------------------------------------------------------------------- Aortic valve:   Mildly thickened, mildly calcified leaflets. Doppler:  There was trivial regurgitation.  ------------------------------------------------------------------- Mitral valve:   Structurally normal valve.   Leaflet separation was normal.  Doppler:  Transvalvular velocity was within the normal range. There was no evidence for stenosis. There was mild regurgitation.    Peak gradient (D): 2 mm Hg.  ------------------------------------------------------------------- Left atrium:  The atrium was normal in size.  ------------------------------------------------------------------- Right ventricle:  The cavity size was normal. Wall thickness was normal. Systolic function was normal.  ------------------------------------------------------------------- Pulmonic valve:   Poorly visualized.  Doppler:  There was no significant regurgitation.  ------------------------------------------------------------------- Tricuspid valve:   Structurally normal valve.   Leaflet separation was normal.  Doppler:  Transvalvular velocity was within the normal range. There was mild regurgitation.  ------------------------------------------------------------------- Right atrium:  The atrium was  normal in size.  ------------------------------------------------------------------- Pericardium:  There was no pericardial effusion.  ------------------------------------------------------------------- Systemic veins: Inferior vena cava: The vessel was normal in size. The respirophasic diameter changes were in the normal range (>= 50%), consistent with normal central venous pressure.  ------------------------------------------------------------------- Measurements  Left ventricle                          Value        Reference LV ID, ED, PLAX chordal                 48.6  mm     43 - 52 LV ID, ES, PLAX chordal                 37.1  mm     23 - 38 LV fx shortening, PLAX chordal  (L)     24    %      >=29 LV PW thickness, ED                     14.2  mm     ---------- IVS/LV PW ratio, ED                     0.99         <=1.3  Ventricular septum                      Value        Reference IVS thickness, ED  14.1  mm     ----------  LVOT                                    Value        Reference LVOT ID, S                              22    mm     ---------- LVOT area                               3.8   cm^2   ----------  Aorta                                   Value        Reference Aortic root ID, ED                      44    mm     ----------  Left atrium                             Value        Reference LA ID, A-P, ES                          45    mm     ---------- LA ID/bsa, A-P                          2.08  cm/m^2 <=2.2 LA volume, S                            76.7  ml     ---------- LA volume/bsa, S                        35.5  ml/m^2 ---------- LA volume, ES, 1-p A4C                  66.4  ml     ---------- LA volume/bsa, ES, 1-p A4C              30.7  ml/m^2 ---------- LA volume, ES, 1-p A2C                  82.4  ml     ---------- LA volume/bsa, ES, 1-p A2C              38.1  ml/m^2 ----------  Mitral valve                            Value         Reference Mitral E-wave peak velocity             73.2  cm/s   ---------- Mitral A-wave peak velocity             94.1  cm/s   ---------- Mitral deceleration time        (H)     271  ms     150 - 230 Mitral peak gradient, D                 2     mm Hg  ---------- Mitral E/A ratio, peak                  0.8          ---------- Mitral regurg VTI, PISA                 177   cm     ---------- Mitral ERO, PISA                        0.04  cm^2   ---------- Mitral regurg volume, PISA              7     ml     ----------  Tricuspid valve                         Value        Reference Tricuspid regurg peak velocity          190   cm/s   ---------- Tricuspid peak RV-RA gradient           14    mm Hg  ----------  Right atrium                            Value        Reference RA ID, S-I, ES, A4C             (H)     60.6  mm     34 - 49 RA area, ES, A4C                        17    cm^2   8.3 - 19.5 RA volume, ES, A/L                      37.8  ml     ---------- RA volume/bsa, ES, A/L                  17.5  ml/m^2 ----------  Right ventricle                         Value        Reference RV ID, minor axis, ED, A4C base         21.5  mm     ---------- TAPSE                                   18    mm     ----------  Legend: (L)  and  (H)  mark values outside specified reference range.  ------------------------------------------------------------------- Prepared and Electronically Authenticated by  Dietrich Pates, M.D. 2018-08-31T15:09:38   MONITORS  CARDIAC EVENT MONITOR 04/06/2018  Narrative An event monitor was performed for 30 days initiating 04/06/2018 to evaluate paroxysmal atrial fibrillation.  Baseline rhythm is sinus 80 bpm.  The rhythm throughout the 30 days was atrial fibrillation.  The minimum and maximum heart rates are 39 and 118 bpm.  There were no pauses of greater than 3 seconds.  There were 5  triggered events without symptoms.  3 of the 5 were sinus rhythm with sinus  arrhythmia.  One showed the presence of frequent APCs and bigeminy and the second showed the presence of occasional ventricular premature contractions.  Conclusion occasional atrial and premature contractions.  The triggered events were associated with arrhythmia and 2 with 5 recordings.               Recent Labs: 02/14/2023: ALT 11; BUN 20; Creatinine, Ser 0.80; Hemoglobin 10.5; Platelets 270; Potassium 3.8; Sodium 142  Recent Lipid Panel    Component Value Date/Time   CHOL 137 02/14/2023 1427   TRIG 107 02/14/2023 1427   HDL 44 02/14/2023 1427   CHOLHDL 3.1 02/14/2023 1427   CHOLHDL 3.7 03/17/2017 0006   VLDL 21 03/17/2017 0006   LDLCALC 73 02/14/2023 1427    Physical Exam:    VS:  There were no vitals taken for this visit.    Wt Readings from Last 3 Encounters:  02/14/23 174 lb (78.9 kg)  11/10/21 179 lb (81.2 kg)  12/29/20 189 lb (85.7 kg)     GEN:  Well nourished, well developed in no acute distress HEENT: Normal NECK: No JVD; No carotid bruits LYMPHATICS: No lymphadenopathy CARDIAC: RRR, no murmurs, rubs, gallops RESPIRATORY:  Clear to auscultation without rales, wheezing or rhonchi  ABDOMEN: Soft, non-tender, non-distended MUSCULOSKELETAL:  No edema; No deformity  SKIN: Warm and dry NEUROLOGIC:  Alert and oriented x 3 PSYCHIATRIC:  Normal affect    Signed, Joshua Herrlich, MD  08/16/2023 2:38 PM    [facility]

## 2023-08-17 ENCOUNTER — Ambulatory Visit: Payer: Medicare Other | Attending: Cardiology | Admitting: Cardiology

## 2023-08-17 ENCOUNTER — Encounter: Payer: Self-pay | Admitting: Cardiology

## 2023-08-17 VITALS — BP 122/74 | HR 63 | Wt 182.0 lb

## 2023-08-17 DIAGNOSIS — I25810 Atherosclerosis of coronary artery bypass graft(s) without angina pectoris: Secondary | ICD-10-CM | POA: Insufficient documentation

## 2023-08-17 DIAGNOSIS — Z7901 Long term (current) use of anticoagulants: Secondary | ICD-10-CM | POA: Diagnosis not present

## 2023-08-17 DIAGNOSIS — I5042 Chronic combined systolic (congestive) and diastolic (congestive) heart failure: Secondary | ICD-10-CM | POA: Insufficient documentation

## 2023-08-17 DIAGNOSIS — I7789 Other specified disorders of arteries and arterioles: Secondary | ICD-10-CM | POA: Insufficient documentation

## 2023-08-17 DIAGNOSIS — I110 Hypertensive heart disease with heart failure: Secondary | ICD-10-CM | POA: Insufficient documentation

## 2023-08-17 DIAGNOSIS — E785 Hyperlipidemia, unspecified: Secondary | ICD-10-CM | POA: Diagnosis not present

## 2023-08-17 DIAGNOSIS — I951 Orthostatic hypotension: Secondary | ICD-10-CM | POA: Diagnosis not present

## 2023-08-17 DIAGNOSIS — I480 Paroxysmal atrial fibrillation: Secondary | ICD-10-CM | POA: Diagnosis not present

## 2023-08-17 MED ORDER — FLUDROCORTISONE ACETATE 0.1 MG PO TABS: 0.1000 mg | ORAL_TABLET | Freq: Every day | ORAL | 3 refills | Status: DC

## 2023-08-17 NOTE — Patient Instructions (Signed)
Medication Instructions:  Your physician recommends that you continue on your current medications as directed. Please refer to the Current Medication list given to you today.  *If you need a refill on your cardiac medications before your next appointment, please call your pharmacy*   Lab Work: None If you have labs (blood work) drawn today and your tests are completely normal, you will receive your results only by: MyChart Message (if you have MyChart) OR A paper copy in the mail If you have any lab test that is abnormal or we need to change your treatment, we will call you to review the results.   Testing/Procedures: None   Follow-Up: At [facility], you and your health needs are our priority.  As part of our continuing mission to provide you with exceptional heart care, we have created designated Provider Care Teams.  These Care Teams include your primary Cardiologist (physician) and Advanced Practice Providers (APPs -  Physician Assistants and Nurse Practitioners) who all work together to provide you with the care you need, when you need it.  We recommend signing up for the patient portal called "MyChart".  Sign up information is provided on this After Visit Summary.  MyChart is used to connect with patients for Virtual Visits (Telemedicine).  Patients are able to view lab/test results, encounter notes, upcoming appointments, etc.  Non-urgent messages can be sent to your provider as well.   To learn more about what you can do with MyChart, go to [email or web address].    Your next appointment:   6 month(s)  Provider:   Norman Herrlich, MD    Other Instructions None

## 2023-08-18 DIAGNOSIS — I5042 Chronic combined systolic (congestive) and diastolic (congestive) heart failure: Secondary | ICD-10-CM | POA: Diagnosis not present

## 2023-08-18 DIAGNOSIS — I480 Paroxysmal atrial fibrillation: Secondary | ICD-10-CM | POA: Diagnosis not present

## 2023-08-18 DIAGNOSIS — I130 Hypertensive heart and chronic kidney disease with heart failure and stage 1 through stage 4 chronic kidney disease, or unspecified chronic kidney disease: Secondary | ICD-10-CM | POA: Diagnosis not present

## 2023-08-18 DIAGNOSIS — N1832 Chronic kidney disease, stage 3b: Secondary | ICD-10-CM | POA: Diagnosis not present

## 2023-08-18 DIAGNOSIS — D6869 Other thrombophilia: Secondary | ICD-10-CM | POA: Diagnosis not present

## 2023-08-18 DIAGNOSIS — K5790 Diverticulosis of intestine, part unspecified, without perforation or abscess without bleeding: Secondary | ICD-10-CM | POA: Diagnosis not present

## 2023-08-24 DIAGNOSIS — D6869 Other thrombophilia: Secondary | ICD-10-CM | POA: Diagnosis not present

## 2023-08-24 DIAGNOSIS — I130 Hypertensive heart and chronic kidney disease with heart failure and stage 1 through stage 4 chronic kidney disease, or unspecified chronic kidney disease: Secondary | ICD-10-CM | POA: Diagnosis not present

## 2023-08-24 DIAGNOSIS — I480 Paroxysmal atrial fibrillation: Secondary | ICD-10-CM | POA: Diagnosis not present

## 2023-08-24 DIAGNOSIS — N1832 Chronic kidney disease, stage 3b: Secondary | ICD-10-CM | POA: Diagnosis not present

## 2023-08-24 DIAGNOSIS — K5790 Diverticulosis of intestine, part unspecified, without perforation or abscess without bleeding: Secondary | ICD-10-CM | POA: Diagnosis not present

## 2023-08-24 DIAGNOSIS — I5042 Chronic combined systolic (congestive) and diastolic (congestive) heart failure: Secondary | ICD-10-CM | POA: Diagnosis not present

## 2023-10-27 DIAGNOSIS — F5000 Anorexia nervosa, unspecified: Secondary | ICD-10-CM | POA: Diagnosis not present

## 2023-10-27 DIAGNOSIS — I672 Cerebral atherosclerosis: Secondary | ICD-10-CM | POA: Diagnosis not present

## 2023-10-27 DIAGNOSIS — R64 Cachexia: Secondary | ICD-10-CM | POA: Diagnosis not present

## 2023-10-27 DIAGNOSIS — F39 Unspecified mood [affective] disorder: Secondary | ICD-10-CM | POA: Diagnosis not present

## 2023-11-06 DIAGNOSIS — L602 Onychogryphosis: Secondary | ICD-10-CM | POA: Diagnosis not present

## 2023-11-06 DIAGNOSIS — Z789 Other specified health status: Secondary | ICD-10-CM | POA: Diagnosis not present

## 2023-11-06 DIAGNOSIS — L603 Nail dystrophy: Secondary | ICD-10-CM | POA: Diagnosis not present

## 2023-11-06 DIAGNOSIS — Z7409 Other reduced mobility: Secondary | ICD-10-CM | POA: Diagnosis not present

## 2023-12-28 DIAGNOSIS — I252 Old myocardial infarction: Secondary | ICD-10-CM | POA: Diagnosis not present

## 2023-12-28 DIAGNOSIS — K921 Melena: Secondary | ICD-10-CM | POA: Diagnosis not present

## 2023-12-28 DIAGNOSIS — I4891 Unspecified atrial fibrillation: Secondary | ICD-10-CM | POA: Diagnosis not present

## 2023-12-28 DIAGNOSIS — I2510 Atherosclerotic heart disease of native coronary artery without angina pectoris: Secondary | ICD-10-CM | POA: Diagnosis not present

## 2023-12-28 DIAGNOSIS — Z7901 Long term (current) use of anticoagulants: Secondary | ICD-10-CM | POA: Diagnosis not present

## 2023-12-28 DIAGNOSIS — I10 Essential (primary) hypertension: Secondary | ICD-10-CM | POA: Diagnosis not present

## 2023-12-28 DIAGNOSIS — K219 Gastro-esophageal reflux disease without esophagitis: Secondary | ICD-10-CM | POA: Diagnosis not present

## 2023-12-28 DIAGNOSIS — Z8673 Personal history of transient ischemic attack (TIA), and cerebral infarction without residual deficits: Secondary | ICD-10-CM | POA: Diagnosis not present

## 2023-12-28 DIAGNOSIS — Z87440 Personal history of urinary (tract) infections: Secondary | ICD-10-CM | POA: Diagnosis not present

## 2024-01-04 DIAGNOSIS — I480 Paroxysmal atrial fibrillation: Secondary | ICD-10-CM | POA: Diagnosis not present

## 2024-01-04 DIAGNOSIS — K922 Gastrointestinal hemorrhage, unspecified: Secondary | ICD-10-CM | POA: Diagnosis not present

## 2024-01-04 DIAGNOSIS — D508 Other iron deficiency anemias: Secondary | ICD-10-CM | POA: Diagnosis not present

## 2024-01-04 DIAGNOSIS — K2960 Other gastritis without bleeding: Secondary | ICD-10-CM | POA: Diagnosis not present

## 2024-01-16 DIAGNOSIS — N401 Enlarged prostate with lower urinary tract symptoms: Secondary | ICD-10-CM | POA: Diagnosis not present

## 2024-01-16 DIAGNOSIS — R351 Nocturia: Secondary | ICD-10-CM | POA: Diagnosis not present

## 2024-02-06 DIAGNOSIS — D508 Other iron deficiency anemias: Secondary | ICD-10-CM | POA: Diagnosis not present

## 2024-02-06 DIAGNOSIS — K922 Gastrointestinal hemorrhage, unspecified: Secondary | ICD-10-CM | POA: Diagnosis not present

## 2024-02-06 DIAGNOSIS — F5000 Anorexia nervosa, unspecified: Secondary | ICD-10-CM | POA: Diagnosis not present

## 2024-02-06 DIAGNOSIS — K2960 Other gastritis without bleeding: Secondary | ICD-10-CM | POA: Diagnosis not present

## 2024-02-06 LAB — LAB REPORT - SCANNED: EGFR: 60

## 2024-02-12 ENCOUNTER — Other Ambulatory Visit: Payer: Self-pay | Admitting: Cardiology

## 2024-02-13 NOTE — Telephone Encounter (Signed)
 Prescription refill request for Eliquis  received. Indication:afib Last office visit:1/25 Scr:0.80  7/24 Age: 89 Weight:82.6  kg  Prescription refilled

## 2024-02-16 ENCOUNTER — Ambulatory Visit: Admitting: Cardiology

## 2024-02-21 ENCOUNTER — Encounter: Payer: Self-pay | Admitting: Cardiology

## 2024-02-21 ENCOUNTER — Ambulatory Visit: Attending: Cardiology | Admitting: Cardiology

## 2024-02-21 VITALS — BP 126/54 | HR 67 | Ht 74.0 in | Wt 174.0 lb

## 2024-02-21 DIAGNOSIS — I4891 Unspecified atrial fibrillation: Secondary | ICD-10-CM | POA: Insufficient documentation

## 2024-02-21 DIAGNOSIS — E785 Hyperlipidemia, unspecified: Secondary | ICD-10-CM | POA: Insufficient documentation

## 2024-02-21 DIAGNOSIS — Z8673 Personal history of transient ischemic attack (TIA), and cerebral infarction without residual deficits: Secondary | ICD-10-CM | POA: Insufficient documentation

## 2024-02-21 DIAGNOSIS — I951 Orthostatic hypotension: Secondary | ICD-10-CM | POA: Diagnosis not present

## 2024-02-21 DIAGNOSIS — I25810 Atherosclerosis of coronary artery bypass graft(s) without angina pectoris: Secondary | ICD-10-CM | POA: Insufficient documentation

## 2024-02-21 DIAGNOSIS — I10 Essential (primary) hypertension: Secondary | ICD-10-CM | POA: Insufficient documentation

## 2024-02-21 NOTE — Patient Instructions (Signed)

## 2024-02-21 NOTE — Progress Notes (Signed)
 Cardiology Office Note:    Date:  02/21/2024   ID:  [name], [name] [birth year], MRN [id number]  PCP:  Street, Lonni HERO, MD  Cardiologist:  Jennifer JONELLE Crape, MD   Referring MD: [address], Lonni HERO, *    ASSESSMENT:    1. Arteriosclerosis of arterial coronary artery bypass graft   2. Atrial fibrillation, unspecified type (HCC)   3. Benign hypertension   4. Orthostatic hypotension   5. Dyslipidemia, goal LDL below 70   6. History of stroke    PLAN:    In order of problems listed above:  Coronary artery disease: Secondary prevention stressed with the patient.  Importance of compliance with diet medication stressed and patient verbalized standing.  He was advised to be active to the best of his ability. Atrial fibrillation:I discussed with the patient atrial fibrillation, disease process. Management and therapy including rate and rhythm control, anticoagulation benefits and potential risks were discussed extensively with the patient. Patient had multiple questions which were answered to patient's satisfaction. Mixed dyslipidemia: On lipid-lowering medications followed by primary care.  Lipids are fine and I discussed findings from lab work obtained from primary care. History of hypertension: Managed with with medications and blood pressure stable. Patient will be seen in follow-up appointment in 6 months with Dr. Monetta or earlier if the patient has any concerns.    Medication Adjustments/Labs and Tests Ordered: Current medicines are reviewed at length with the patient today.  Concerns regarding medicines are outlined above.  No orders of the defined types were placed in this encounter.  No orders of the defined types were placed in this encounter.    No chief complaint on file.    History of Present Illness:    Joshua Chambers is a 89 y.o. male.  Patient has past medical history of coronary artery disease, atrial fibrillation, essential hypertension, mixed  dyslipidemia.  He is brought in in a wheelchair by his son.  He denies any chest pain orthopnea or PND.  Patient appears comfortable and denies any symptoms.  He is here for routine follow-up.  At the time of my evaluation, the patient is alert awake oriented and in no distress.  I reviewed blood work from recent doctor visit we obtained by problem.  Past Medical History:  Diagnosis Date   Arteriosclerosis of arterial coronary artery bypass graft    Arthritis    Atrial contractions, premature    Atrial fibrillation (HCC)    Benign hypertension    Bilateral carotid artery stenosis    CAD (coronary artery disease), autologous vein bypass graft 04/19/2013   100% flush occlusion of SVG-OM & SVG-Diag; near Subtotal Occlusion of SVG-RCA (PCI of RCA done to avoid distal embolization with PCI to SVG)   CAD (coronary artery disease), native coronary artery 2007   Multivessel CAD referred for CABG   CAD S/P percutaneous coronary angioplasty 04/19/2013   PCI of Native RCA - Promus Premier DES 3.0 mm x 38 mm (3.6 mm);    Chronic anticoagulation 04/17/2018   Chronic combined systolic and diastolic heart failure (HCC)    Chronic left shoulder pain 11/09/2021   Coronary artery disease involving coronary bypass graft of native heart without angina pectoris; CAD (coronary artery disease) of artery bypass graft:  loss of VG to RCA and VG-Diag 04/17/2013   Dyslipidemia    Dyslipidemia, goal LDL below 70    Dyspnea 04/30/2013   Essential hypertension 05/02/2013   Fatigue 04/17/2013   GERD (gastroesophageal reflux disease)  Hematuria 05/02/2013   History of DVT of lower extremity    Right popliteal vein   History of stroke 02/2010   Possible TIA in 2001;  Echo: EF 45-50%, mild HK of anterior septum.   History of transient cerebral ischemia    Hx of orthostatic hypotension 2009   Positive tilt table:  dizziness/vasopressor type; previously on the decision    Hx SBO    Ischemic cardiomyopathy  02/06/2018   Left arm numbness 12/29/2020   Nephrolithiasis    Orthostatic hypotension    Osteoarthritis of left glenohumeral joint 11/09/2021   Other and unspecified coagulation defects 04/17/2013   PAF (paroxysmal atrial fibrillation) (HCC) 02/06/2018   Paroxysmal atrial fibrillation (HCC) 12/29/2020   Premature atrial contractions 07/28/2015   Preoperative examination 04/17/2013   IMO SNOMED Dx Update Oct 2024     S/P CABG x 3 2007   LIMA-LAD, SVG-DIAG, SVG-RCA   S/P CABG x 4; LIMA-LAD, SVG-RPDA, SVG-OM, SVG-D1 -- all but LIMA-LAD occluded 03/18/2006   03/2006 - LIMA-LAD, SVG-D1, SVG-RPDA    Sinus arrhythmia 09/09/2016   Stroke [facility])    TIA (transient ischemic attack) 03/16/2017   Unsteady gait     Past Surgical History:  Procedure Laterality Date   APPENDECTOMY     CARDIAC SURGERY  2000s   COLON SURGERY     CORONARY ANGIOPLASTY WITH STENT PLACEMENT  04/19/13   Stent-Promus DES (3.0 mm x 38 mm - 3.6 mm) to native RCA prox-mid,   CORONARY ARTERY BYPASS GRAFT  2007   LIMA-LAD, SVG-D1, SVG-RCA   LEFT HEART CATHETERIZATION WITH CORONARY/GRAFT ANGIOGRAM Left 04/19/2013   LEFT HEART CATH W/ CORONARY/GRAFT ANGIOGRAM;  Alm LELON Clay, MD;  Location: [facility] CATH LAB;  LM 20%-->LAD 70-80% then subTO after D1 (Patent LIMA-LAD); Small non-dom Cx ~20% ostial.  RCA long 60-70% prox --> 90% mid.  SVG-OM & SVG-Diag 100%, diffuse Subtotal occlusion of SVG-rPD   STENT PLACEMENT VASCULAR ([facility] HX)     TONSILLECTOMY      Current Medications: Current Meds  Medication Sig   Acetaminophen  (TYLENOL  EXTRA STRENGTH PO) Take 1,000 mg by mouth 2 (two) times daily as needed (PAIN).   atorvastatin  (LIPITOR) 20 MG tablet Take 20 mg by mouth at bedtime.   cyanocobalamin  (VITAMIN B12) 1000 MCG tablet Take 1,000 mcg by mouth daily.   dutasteride  (AVODART ) 0.5 MG capsule Take 0.5 mg by mouth daily.   ELIQUIS  2.5 MG TABS tablet TAKE 1 TABLET(2.5 MG) BY MOUTH TWICE DAILY   esomeprazole (NEXIUM) 20 MG capsule  Take 20 mg by mouth daily at 12 noon.   ferrous sulfate 325 (65 FE) MG EC tablet Take 325 mg by mouth 3 (three) times daily with meals.   fludrocortisone  (FLORINEF ) 0.1 MG tablet Take 1 tablet (0.1 mg total) by mouth daily.   metoprolol  tartrate (LOPRESSOR ) 25 MG tablet Take 0.5 tablets (12.5 mg total) by mouth daily.   midodrine  (PROAMATINE ) 10 MG tablet TAKE 1 TABLET(10 MG) BY MOUTH FOUR TIMES DAILY   nitroGLYCERIN  (NITROSTAT ) 0.4 MG SL tablet Place 1 tablet (0.4 mg total) under the tongue every 5 (five) minutes as needed for chest pain.   oxyCODONE (OXY IR/ROXICODONE) 5 MG immediate release tablet Take 5 mg by mouth every 8 (eight) hours as needed for moderate pain or severe pain.   Polyvinyl Alcohol -Povidone (REFRESH OP) Place 1 drop into both eyes daily as needed (DRY EYES).   pyridostigmine  (MESTINON ) 60 MG tablet 1/2 to 1 pill po tid   QUEtiapine (SEROQUEL)  25 MG tablet Take 12.5 mg by mouth at bedtime.   Sennosides (SENOKOT PO) Take 5 mg by mouth 2 (two) times daily as needed (CONSTIPATION).   tamsulosin (FLOMAX) 0.4 MG CAPS capsule Take 0.4 mg by mouth daily.     Allergies:   Amiodarone, Diltiazem, Statins, and Sulfa antibiotics   Social History   Socioeconomic History   Marital status: Married    Spouse name: Not on file   Number of children: Not on file   Years of education: Not on file   Highest education level: Not on file  Occupational History   Not on file  Tobacco Use   Smoking status: Never    Passive exposure: Never   Smokeless tobacco: Never  Vaping Use   Vaping status: Never Used  Substance and Sexual Activity   Alcohol  use: Yes    Comment: beer w/ pizza rarely   Drug use: No   Sexual activity: Not on file  Other Topics Concern   Not on file  Social History Narrative    He is now remarried for the last 2 years, his first wife of 60 years is deceased. He has 1child from that first marriage and 2 grandchildren. He does not smoke, does not drink. He is not  very active but he does kind of meander around with his new wife who is permanently disabled.    Right handed   Caffeine use: coffee/soda daily   Social Drivers of Corporate investment banker Strain: Not on file  Food Insecurity: Not on file  Transportation Needs: Not on file  Physical Activity: Not on file  Stress: Not on file  Social Connections: Not on file     Family History: The patient's family history includes Colon cancer in his father; Hypertension in his son.  ROS:   Please see the history of present illness.    All other systems reviewed and are negative.  EKGs/Labs/Other Studies Reviewed:    The following studies were reviewed today: .SABRA   I discussed my findings with the patient at length   Recent Labs: No results found for requested labs within last 365 days.  Recent Lipid Panel    Component Value Date/Time   CHOL 137 02/14/2023 1427   TRIG 107 02/14/2023 1427   HDL 44 02/14/2023 1427   CHOLHDL 3.1 02/14/2023 1427   CHOLHDL 3.7 03/17/2017 0006   VLDL 21 03/17/2017 0006   LDLCALC 73 02/14/2023 1427    Physical Exam:    VS:  BP (!) 126/54   Pulse 67   Ht 6' 2 (1.88 m)   Wt 174 lb (78.9 kg)   SpO2 95%   BMI 22.34 kg/m     Wt Readings from Last 3 Encounters:  02/21/24 174 lb (78.9 kg)  08/17/23 182 lb (82.6 kg)  02/14/23 174 lb (78.9 kg)     GEN: Patient is in no acute distress HEENT: Normal NECK: No JVD; No carotid bruits LYMPHATICS: No lymphadenopathy CARDIAC: Hear sounds regular, 2/6 systolic murmur at the apex. RESPIRATORY:  Clear to auscultation without rales, wheezing or rhonchi  ABDOMEN: Soft, non-tender, non-distended MUSCULOSKELETAL:  No edema; No deformity  SKIN: Warm and dry NEUROLOGIC:  Alert and oriented x 3 PSYCHIATRIC:  Normal affect   Signed, Jennifer JONELLE Crape, MD  02/21/2024 1:55 PM    [facility]

## 2024-02-22 DIAGNOSIS — F02B0 Dementia in other diseases classified elsewhere, moderate, without behavioral disturbance, psychotic disturbance, mood disturbance, and anxiety: Secondary | ICD-10-CM | POA: Diagnosis not present

## 2024-02-29 DIAGNOSIS — I6523 Occlusion and stenosis of bilateral carotid arteries: Secondary | ICD-10-CM | POA: Diagnosis not present

## 2024-02-29 DIAGNOSIS — I2510 Atherosclerotic heart disease of native coronary artery without angina pectoris: Secondary | ICD-10-CM | POA: Diagnosis not present

## 2024-02-29 DIAGNOSIS — K219 Gastro-esophageal reflux disease without esophagitis: Secondary | ICD-10-CM | POA: Diagnosis not present

## 2024-02-29 DIAGNOSIS — G8929 Other chronic pain: Secondary | ICD-10-CM | POA: Diagnosis not present

## 2024-02-29 DIAGNOSIS — N1832 Chronic kidney disease, stage 3b: Secondary | ICD-10-CM | POA: Diagnosis not present

## 2024-02-29 DIAGNOSIS — I5042 Chronic combined systolic (congestive) and diastolic (congestive) heart failure: Secondary | ICD-10-CM | POA: Diagnosis not present

## 2024-02-29 DIAGNOSIS — E782 Mixed hyperlipidemia: Secondary | ICD-10-CM | POA: Diagnosis not present

## 2024-02-29 DIAGNOSIS — N401 Enlarged prostate with lower urinary tract symptoms: Secondary | ICD-10-CM | POA: Diagnosis not present

## 2024-02-29 DIAGNOSIS — I491 Atrial premature depolarization: Secondary | ICD-10-CM | POA: Diagnosis not present

## 2024-02-29 DIAGNOSIS — I130 Hypertensive heart and chronic kidney disease with heart failure and stage 1 through stage 4 chronic kidney disease, or unspecified chronic kidney disease: Secondary | ICD-10-CM | POA: Diagnosis not present

## 2024-02-29 DIAGNOSIS — I255 Ischemic cardiomyopathy: Secondary | ICD-10-CM | POA: Diagnosis not present

## 2024-02-29 DIAGNOSIS — N138 Other obstructive and reflux uropathy: Secondary | ICD-10-CM | POA: Diagnosis not present

## 2024-02-29 DIAGNOSIS — D631 Anemia in chronic kidney disease: Secondary | ICD-10-CM | POA: Diagnosis not present

## 2024-02-29 DIAGNOSIS — I951 Orthostatic hypotension: Secondary | ICD-10-CM | POA: Diagnosis not present

## 2024-02-29 DIAGNOSIS — F5000 Anorexia nervosa, unspecified: Secondary | ICD-10-CM | POA: Diagnosis not present

## 2024-02-29 DIAGNOSIS — D508 Other iron deficiency anemias: Secondary | ICD-10-CM | POA: Diagnosis not present

## 2024-02-29 DIAGNOSIS — K5730 Diverticulosis of large intestine without perforation or abscess without bleeding: Secondary | ICD-10-CM | POA: Diagnosis not present

## 2024-02-29 DIAGNOSIS — Z7952 Long term (current) use of systemic steroids: Secondary | ICD-10-CM | POA: Diagnosis not present

## 2024-02-29 DIAGNOSIS — I480 Paroxysmal atrial fibrillation: Secondary | ICD-10-CM | POA: Diagnosis not present

## 2024-02-29 DIAGNOSIS — Z7901 Long term (current) use of anticoagulants: Secondary | ICD-10-CM | POA: Diagnosis not present

## 2024-02-29 DIAGNOSIS — I7121 Aneurysm of the ascending aorta, without rupture: Secondary | ICD-10-CM | POA: Diagnosis not present

## 2024-02-29 DIAGNOSIS — H9110 Presbycusis, unspecified ear: Secondary | ICD-10-CM | POA: Diagnosis not present

## 2024-02-29 DIAGNOSIS — M19012 Primary osteoarthritis, left shoulder: Secondary | ICD-10-CM | POA: Diagnosis not present

## 2024-02-29 DIAGNOSIS — K2960 Other gastritis without bleeding: Secondary | ICD-10-CM | POA: Diagnosis not present

## 2024-03-01 DIAGNOSIS — K5730 Diverticulosis of large intestine without perforation or abscess without bleeding: Secondary | ICD-10-CM | POA: Diagnosis not present

## 2024-03-01 DIAGNOSIS — I480 Paroxysmal atrial fibrillation: Secondary | ICD-10-CM | POA: Diagnosis not present

## 2024-03-01 DIAGNOSIS — D631 Anemia in chronic kidney disease: Secondary | ICD-10-CM | POA: Diagnosis not present

## 2024-03-01 DIAGNOSIS — N1832 Chronic kidney disease, stage 3b: Secondary | ICD-10-CM | POA: Diagnosis not present

## 2024-03-01 DIAGNOSIS — I5042 Chronic combined systolic (congestive) and diastolic (congestive) heart failure: Secondary | ICD-10-CM | POA: Diagnosis not present

## 2024-03-01 DIAGNOSIS — I130 Hypertensive heart and chronic kidney disease with heart failure and stage 1 through stage 4 chronic kidney disease, or unspecified chronic kidney disease: Secondary | ICD-10-CM | POA: Diagnosis not present

## 2024-03-04 DIAGNOSIS — I5042 Chronic combined systolic (congestive) and diastolic (congestive) heart failure: Secondary | ICD-10-CM | POA: Diagnosis not present

## 2024-03-04 DIAGNOSIS — D631 Anemia in chronic kidney disease: Secondary | ICD-10-CM | POA: Diagnosis not present

## 2024-03-04 DIAGNOSIS — N1832 Chronic kidney disease, stage 3b: Secondary | ICD-10-CM | POA: Diagnosis not present

## 2024-03-04 DIAGNOSIS — I130 Hypertensive heart and chronic kidney disease with heart failure and stage 1 through stage 4 chronic kidney disease, or unspecified chronic kidney disease: Secondary | ICD-10-CM | POA: Diagnosis not present

## 2024-03-04 DIAGNOSIS — I480 Paroxysmal atrial fibrillation: Secondary | ICD-10-CM | POA: Diagnosis not present

## 2024-03-04 DIAGNOSIS — K5730 Diverticulosis of large intestine without perforation or abscess without bleeding: Secondary | ICD-10-CM | POA: Diagnosis not present

## 2024-03-05 DIAGNOSIS — I130 Hypertensive heart and chronic kidney disease with heart failure and stage 1 through stage 4 chronic kidney disease, or unspecified chronic kidney disease: Secondary | ICD-10-CM | POA: Diagnosis not present

## 2024-03-05 DIAGNOSIS — I480 Paroxysmal atrial fibrillation: Secondary | ICD-10-CM | POA: Diagnosis not present

## 2024-03-05 DIAGNOSIS — K5730 Diverticulosis of large intestine without perforation or abscess without bleeding: Secondary | ICD-10-CM | POA: Diagnosis not present

## 2024-03-05 DIAGNOSIS — N1832 Chronic kidney disease, stage 3b: Secondary | ICD-10-CM | POA: Diagnosis not present

## 2024-03-05 DIAGNOSIS — I5042 Chronic combined systolic (congestive) and diastolic (congestive) heart failure: Secondary | ICD-10-CM | POA: Diagnosis not present

## 2024-03-05 DIAGNOSIS — D631 Anemia in chronic kidney disease: Secondary | ICD-10-CM | POA: Diagnosis not present

## 2024-03-06 DIAGNOSIS — D631 Anemia in chronic kidney disease: Secondary | ICD-10-CM | POA: Diagnosis not present

## 2024-03-06 DIAGNOSIS — I480 Paroxysmal atrial fibrillation: Secondary | ICD-10-CM | POA: Diagnosis not present

## 2024-03-06 DIAGNOSIS — I5042 Chronic combined systolic (congestive) and diastolic (congestive) heart failure: Secondary | ICD-10-CM | POA: Diagnosis not present

## 2024-03-06 DIAGNOSIS — K5730 Diverticulosis of large intestine without perforation or abscess without bleeding: Secondary | ICD-10-CM | POA: Diagnosis not present

## 2024-03-06 DIAGNOSIS — I130 Hypertensive heart and chronic kidney disease with heart failure and stage 1 through stage 4 chronic kidney disease, or unspecified chronic kidney disease: Secondary | ICD-10-CM | POA: Diagnosis not present

## 2024-03-06 DIAGNOSIS — N1832 Chronic kidney disease, stage 3b: Secondary | ICD-10-CM | POA: Diagnosis not present

## 2024-03-07 DIAGNOSIS — D631 Anemia in chronic kidney disease: Secondary | ICD-10-CM | POA: Diagnosis not present

## 2024-03-07 DIAGNOSIS — I5042 Chronic combined systolic (congestive) and diastolic (congestive) heart failure: Secondary | ICD-10-CM | POA: Diagnosis not present

## 2024-03-07 DIAGNOSIS — N1832 Chronic kidney disease, stage 3b: Secondary | ICD-10-CM | POA: Diagnosis not present

## 2024-03-07 DIAGNOSIS — K5730 Diverticulosis of large intestine without perforation or abscess without bleeding: Secondary | ICD-10-CM | POA: Diagnosis not present

## 2024-03-07 DIAGNOSIS — I130 Hypertensive heart and chronic kidney disease with heart failure and stage 1 through stage 4 chronic kidney disease, or unspecified chronic kidney disease: Secondary | ICD-10-CM | POA: Diagnosis not present

## 2024-03-07 DIAGNOSIS — I480 Paroxysmal atrial fibrillation: Secondary | ICD-10-CM | POA: Diagnosis not present

## 2024-03-11 DIAGNOSIS — I5042 Chronic combined systolic (congestive) and diastolic (congestive) heart failure: Secondary | ICD-10-CM | POA: Diagnosis not present

## 2024-03-11 DIAGNOSIS — K5730 Diverticulosis of large intestine without perforation or abscess without bleeding: Secondary | ICD-10-CM | POA: Diagnosis not present

## 2024-03-11 DIAGNOSIS — I130 Hypertensive heart and chronic kidney disease with heart failure and stage 1 through stage 4 chronic kidney disease, or unspecified chronic kidney disease: Secondary | ICD-10-CM | POA: Diagnosis not present

## 2024-03-11 DIAGNOSIS — D631 Anemia in chronic kidney disease: Secondary | ICD-10-CM | POA: Diagnosis not present

## 2024-03-11 DIAGNOSIS — N1832 Chronic kidney disease, stage 3b: Secondary | ICD-10-CM | POA: Diagnosis not present

## 2024-03-11 DIAGNOSIS — I480 Paroxysmal atrial fibrillation: Secondary | ICD-10-CM | POA: Diagnosis not present

## 2024-03-12 DIAGNOSIS — D631 Anemia in chronic kidney disease: Secondary | ICD-10-CM | POA: Diagnosis not present

## 2024-03-12 DIAGNOSIS — I5042 Chronic combined systolic (congestive) and diastolic (congestive) heart failure: Secondary | ICD-10-CM | POA: Diagnosis not present

## 2024-03-12 DIAGNOSIS — K5730 Diverticulosis of large intestine without perforation or abscess without bleeding: Secondary | ICD-10-CM | POA: Diagnosis not present

## 2024-03-12 DIAGNOSIS — N1832 Chronic kidney disease, stage 3b: Secondary | ICD-10-CM | POA: Diagnosis not present

## 2024-03-12 DIAGNOSIS — I480 Paroxysmal atrial fibrillation: Secondary | ICD-10-CM | POA: Diagnosis not present

## 2024-03-12 DIAGNOSIS — I130 Hypertensive heart and chronic kidney disease with heart failure and stage 1 through stage 4 chronic kidney disease, or unspecified chronic kidney disease: Secondary | ICD-10-CM | POA: Diagnosis not present

## 2024-03-13 DIAGNOSIS — I5042 Chronic combined systolic (congestive) and diastolic (congestive) heart failure: Secondary | ICD-10-CM | POA: Diagnosis not present

## 2024-03-13 DIAGNOSIS — I480 Paroxysmal atrial fibrillation: Secondary | ICD-10-CM | POA: Diagnosis not present

## 2024-03-13 DIAGNOSIS — N1832 Chronic kidney disease, stage 3b: Secondary | ICD-10-CM | POA: Diagnosis not present

## 2024-03-13 DIAGNOSIS — K5730 Diverticulosis of large intestine without perforation or abscess without bleeding: Secondary | ICD-10-CM | POA: Diagnosis not present

## 2024-03-13 DIAGNOSIS — I130 Hypertensive heart and chronic kidney disease with heart failure and stage 1 through stage 4 chronic kidney disease, or unspecified chronic kidney disease: Secondary | ICD-10-CM | POA: Diagnosis not present

## 2024-03-13 DIAGNOSIS — D631 Anemia in chronic kidney disease: Secondary | ICD-10-CM | POA: Diagnosis not present

## 2024-03-14 DIAGNOSIS — I480 Paroxysmal atrial fibrillation: Secondary | ICD-10-CM | POA: Diagnosis not present

## 2024-03-14 DIAGNOSIS — I130 Hypertensive heart and chronic kidney disease with heart failure and stage 1 through stage 4 chronic kidney disease, or unspecified chronic kidney disease: Secondary | ICD-10-CM | POA: Diagnosis not present

## 2024-03-14 DIAGNOSIS — I5042 Chronic combined systolic (congestive) and diastolic (congestive) heart failure: Secondary | ICD-10-CM | POA: Diagnosis not present

## 2024-03-14 DIAGNOSIS — D631 Anemia in chronic kidney disease: Secondary | ICD-10-CM | POA: Diagnosis not present

## 2024-03-14 DIAGNOSIS — K5730 Diverticulosis of large intestine without perforation or abscess without bleeding: Secondary | ICD-10-CM | POA: Diagnosis not present

## 2024-03-14 DIAGNOSIS — N1832 Chronic kidney disease, stage 3b: Secondary | ICD-10-CM | POA: Diagnosis not present

## 2024-03-18 DIAGNOSIS — F02B0 Dementia in other diseases classified elsewhere, moderate, without behavioral disturbance, psychotic disturbance, mood disturbance, and anxiety: Secondary | ICD-10-CM | POA: Diagnosis not present

## 2024-03-19 DIAGNOSIS — I480 Paroxysmal atrial fibrillation: Secondary | ICD-10-CM | POA: Diagnosis not present

## 2024-03-19 DIAGNOSIS — I5042 Chronic combined systolic (congestive) and diastolic (congestive) heart failure: Secondary | ICD-10-CM | POA: Diagnosis not present

## 2024-03-19 DIAGNOSIS — F02B0 Dementia in other diseases classified elsewhere, moderate, without behavioral disturbance, psychotic disturbance, mood disturbance, and anxiety: Secondary | ICD-10-CM | POA: Diagnosis not present

## 2024-03-19 DIAGNOSIS — K5730 Diverticulosis of large intestine without perforation or abscess without bleeding: Secondary | ICD-10-CM | POA: Diagnosis not present

## 2024-03-19 DIAGNOSIS — N1832 Chronic kidney disease, stage 3b: Secondary | ICD-10-CM | POA: Diagnosis not present

## 2024-03-19 DIAGNOSIS — I130 Hypertensive heart and chronic kidney disease with heart failure and stage 1 through stage 4 chronic kidney disease, or unspecified chronic kidney disease: Secondary | ICD-10-CM | POA: Diagnosis not present

## 2024-03-19 DIAGNOSIS — D631 Anemia in chronic kidney disease: Secondary | ICD-10-CM | POA: Diagnosis not present

## 2024-03-20 DIAGNOSIS — I130 Hypertensive heart and chronic kidney disease with heart failure and stage 1 through stage 4 chronic kidney disease, or unspecified chronic kidney disease: Secondary | ICD-10-CM | POA: Diagnosis not present

## 2024-03-20 DIAGNOSIS — D631 Anemia in chronic kidney disease: Secondary | ICD-10-CM | POA: Diagnosis not present

## 2024-03-20 DIAGNOSIS — I480 Paroxysmal atrial fibrillation: Secondary | ICD-10-CM | POA: Diagnosis not present

## 2024-03-20 DIAGNOSIS — N1832 Chronic kidney disease, stage 3b: Secondary | ICD-10-CM | POA: Diagnosis not present

## 2024-03-20 DIAGNOSIS — K5730 Diverticulosis of large intestine without perforation or abscess without bleeding: Secondary | ICD-10-CM | POA: Diagnosis not present

## 2024-03-20 DIAGNOSIS — I5042 Chronic combined systolic (congestive) and diastolic (congestive) heart failure: Secondary | ICD-10-CM | POA: Diagnosis not present

## 2024-03-21 DIAGNOSIS — D631 Anemia in chronic kidney disease: Secondary | ICD-10-CM | POA: Diagnosis not present

## 2024-03-21 DIAGNOSIS — K5730 Diverticulosis of large intestine without perforation or abscess without bleeding: Secondary | ICD-10-CM | POA: Diagnosis not present

## 2024-03-21 DIAGNOSIS — N1832 Chronic kidney disease, stage 3b: Secondary | ICD-10-CM | POA: Diagnosis not present

## 2024-03-21 DIAGNOSIS — I130 Hypertensive heart and chronic kidney disease with heart failure and stage 1 through stage 4 chronic kidney disease, or unspecified chronic kidney disease: Secondary | ICD-10-CM | POA: Diagnosis not present

## 2024-03-21 DIAGNOSIS — I480 Paroxysmal atrial fibrillation: Secondary | ICD-10-CM | POA: Diagnosis not present

## 2024-03-21 DIAGNOSIS — I5042 Chronic combined systolic (congestive) and diastolic (congestive) heart failure: Secondary | ICD-10-CM | POA: Diagnosis not present

## 2024-03-23 DIAGNOSIS — K5730 Diverticulosis of large intestine without perforation or abscess without bleeding: Secondary | ICD-10-CM | POA: Diagnosis not present

## 2024-03-23 DIAGNOSIS — I130 Hypertensive heart and chronic kidney disease with heart failure and stage 1 through stage 4 chronic kidney disease, or unspecified chronic kidney disease: Secondary | ICD-10-CM | POA: Diagnosis not present

## 2024-03-23 DIAGNOSIS — I5042 Chronic combined systolic (congestive) and diastolic (congestive) heart failure: Secondary | ICD-10-CM | POA: Diagnosis not present

## 2024-03-23 DIAGNOSIS — N1832 Chronic kidney disease, stage 3b: Secondary | ICD-10-CM | POA: Diagnosis not present

## 2024-03-23 DIAGNOSIS — I480 Paroxysmal atrial fibrillation: Secondary | ICD-10-CM | POA: Diagnosis not present

## 2024-03-23 DIAGNOSIS — D631 Anemia in chronic kidney disease: Secondary | ICD-10-CM | POA: Diagnosis not present

## 2024-03-25 DIAGNOSIS — I130 Hypertensive heart and chronic kidney disease with heart failure and stage 1 through stage 4 chronic kidney disease, or unspecified chronic kidney disease: Secondary | ICD-10-CM | POA: Diagnosis not present

## 2024-03-25 DIAGNOSIS — I5042 Chronic combined systolic (congestive) and diastolic (congestive) heart failure: Secondary | ICD-10-CM | POA: Diagnosis not present

## 2024-03-25 DIAGNOSIS — I480 Paroxysmal atrial fibrillation: Secondary | ICD-10-CM | POA: Diagnosis not present

## 2024-03-25 DIAGNOSIS — D631 Anemia in chronic kidney disease: Secondary | ICD-10-CM | POA: Diagnosis not present

## 2024-03-25 DIAGNOSIS — N1832 Chronic kidney disease, stage 3b: Secondary | ICD-10-CM | POA: Diagnosis not present

## 2024-03-25 DIAGNOSIS — K5730 Diverticulosis of large intestine without perforation or abscess without bleeding: Secondary | ICD-10-CM | POA: Diagnosis not present

## 2024-03-26 DIAGNOSIS — D631 Anemia in chronic kidney disease: Secondary | ICD-10-CM | POA: Diagnosis not present

## 2024-03-26 DIAGNOSIS — I480 Paroxysmal atrial fibrillation: Secondary | ICD-10-CM | POA: Diagnosis not present

## 2024-03-26 DIAGNOSIS — N1832 Chronic kidney disease, stage 3b: Secondary | ICD-10-CM | POA: Diagnosis not present

## 2024-03-26 DIAGNOSIS — K5730 Diverticulosis of large intestine without perforation or abscess without bleeding: Secondary | ICD-10-CM | POA: Diagnosis not present

## 2024-03-26 DIAGNOSIS — I130 Hypertensive heart and chronic kidney disease with heart failure and stage 1 through stage 4 chronic kidney disease, or unspecified chronic kidney disease: Secondary | ICD-10-CM | POA: Diagnosis not present

## 2024-03-26 DIAGNOSIS — I5042 Chronic combined systolic (congestive) and diastolic (congestive) heart failure: Secondary | ICD-10-CM | POA: Diagnosis not present

## 2024-03-28 DIAGNOSIS — Z23 Encounter for immunization: Secondary | ICD-10-CM | POA: Diagnosis not present

## 2024-03-30 DIAGNOSIS — D508 Other iron deficiency anemias: Secondary | ICD-10-CM | POA: Diagnosis not present

## 2024-03-30 DIAGNOSIS — D631 Anemia in chronic kidney disease: Secondary | ICD-10-CM | POA: Diagnosis not present

## 2024-03-30 DIAGNOSIS — H9110 Presbycusis, unspecified ear: Secondary | ICD-10-CM | POA: Diagnosis not present

## 2024-03-30 DIAGNOSIS — I480 Paroxysmal atrial fibrillation: Secondary | ICD-10-CM | POA: Diagnosis not present

## 2024-03-30 DIAGNOSIS — Z7901 Long term (current) use of anticoagulants: Secondary | ICD-10-CM | POA: Diagnosis not present

## 2024-03-30 DIAGNOSIS — K5730 Diverticulosis of large intestine without perforation or abscess without bleeding: Secondary | ICD-10-CM | POA: Diagnosis not present

## 2024-03-30 DIAGNOSIS — E782 Mixed hyperlipidemia: Secondary | ICD-10-CM | POA: Diagnosis not present

## 2024-03-30 DIAGNOSIS — I2510 Atherosclerotic heart disease of native coronary artery without angina pectoris: Secondary | ICD-10-CM | POA: Diagnosis not present

## 2024-03-30 DIAGNOSIS — K219 Gastro-esophageal reflux disease without esophagitis: Secondary | ICD-10-CM | POA: Diagnosis not present

## 2024-03-30 DIAGNOSIS — I5042 Chronic combined systolic (congestive) and diastolic (congestive) heart failure: Secondary | ICD-10-CM | POA: Diagnosis not present

## 2024-03-30 DIAGNOSIS — I951 Orthostatic hypotension: Secondary | ICD-10-CM | POA: Diagnosis not present

## 2024-03-30 DIAGNOSIS — I7121 Aneurysm of the ascending aorta, without rupture: Secondary | ICD-10-CM | POA: Diagnosis not present

## 2024-03-30 DIAGNOSIS — K2960 Other gastritis without bleeding: Secondary | ICD-10-CM | POA: Diagnosis not present

## 2024-03-30 DIAGNOSIS — I491 Atrial premature depolarization: Secondary | ICD-10-CM | POA: Diagnosis not present

## 2024-03-30 DIAGNOSIS — F5000 Anorexia nervosa, unspecified: Secondary | ICD-10-CM | POA: Diagnosis not present

## 2024-03-30 DIAGNOSIS — N138 Other obstructive and reflux uropathy: Secondary | ICD-10-CM | POA: Diagnosis not present

## 2024-03-30 DIAGNOSIS — N401 Enlarged prostate with lower urinary tract symptoms: Secondary | ICD-10-CM | POA: Diagnosis not present

## 2024-03-30 DIAGNOSIS — N1832 Chronic kidney disease, stage 3b: Secondary | ICD-10-CM | POA: Diagnosis not present

## 2024-03-30 DIAGNOSIS — Z7952 Long term (current) use of systemic steroids: Secondary | ICD-10-CM | POA: Diagnosis not present

## 2024-03-30 DIAGNOSIS — M19012 Primary osteoarthritis, left shoulder: Secondary | ICD-10-CM | POA: Diagnosis not present

## 2024-03-30 DIAGNOSIS — I130 Hypertensive heart and chronic kidney disease with heart failure and stage 1 through stage 4 chronic kidney disease, or unspecified chronic kidney disease: Secondary | ICD-10-CM | POA: Diagnosis not present

## 2024-03-30 DIAGNOSIS — I6523 Occlusion and stenosis of bilateral carotid arteries: Secondary | ICD-10-CM | POA: Diagnosis not present

## 2024-03-30 DIAGNOSIS — I255 Ischemic cardiomyopathy: Secondary | ICD-10-CM | POA: Diagnosis not present

## 2024-03-30 DIAGNOSIS — G8929 Other chronic pain: Secondary | ICD-10-CM | POA: Diagnosis not present

## 2024-04-01 DIAGNOSIS — I480 Paroxysmal atrial fibrillation: Secondary | ICD-10-CM | POA: Diagnosis not present

## 2024-04-01 DIAGNOSIS — K5730 Diverticulosis of large intestine without perforation or abscess without bleeding: Secondary | ICD-10-CM | POA: Diagnosis not present

## 2024-04-01 DIAGNOSIS — D631 Anemia in chronic kidney disease: Secondary | ICD-10-CM | POA: Diagnosis not present

## 2024-04-01 DIAGNOSIS — I130 Hypertensive heart and chronic kidney disease with heart failure and stage 1 through stage 4 chronic kidney disease, or unspecified chronic kidney disease: Secondary | ICD-10-CM | POA: Diagnosis not present

## 2024-04-01 DIAGNOSIS — I5042 Chronic combined systolic (congestive) and diastolic (congestive) heart failure: Secondary | ICD-10-CM | POA: Diagnosis not present

## 2024-04-01 DIAGNOSIS — N1832 Chronic kidney disease, stage 3b: Secondary | ICD-10-CM | POA: Diagnosis not present

## 2024-04-02 DIAGNOSIS — F02B0 Dementia in other diseases classified elsewhere, moderate, without behavioral disturbance, psychotic disturbance, mood disturbance, and anxiety: Secondary | ICD-10-CM | POA: Diagnosis not present

## 2024-04-04 DIAGNOSIS — N1832 Chronic kidney disease, stage 3b: Secondary | ICD-10-CM | POA: Diagnosis not present

## 2024-04-04 DIAGNOSIS — I130 Hypertensive heart and chronic kidney disease with heart failure and stage 1 through stage 4 chronic kidney disease, or unspecified chronic kidney disease: Secondary | ICD-10-CM | POA: Diagnosis not present

## 2024-04-04 DIAGNOSIS — I480 Paroxysmal atrial fibrillation: Secondary | ICD-10-CM | POA: Diagnosis not present

## 2024-04-04 DIAGNOSIS — D631 Anemia in chronic kidney disease: Secondary | ICD-10-CM | POA: Diagnosis not present

## 2024-04-04 DIAGNOSIS — K5730 Diverticulosis of large intestine without perforation or abscess without bleeding: Secondary | ICD-10-CM | POA: Diagnosis not present

## 2024-04-04 DIAGNOSIS — I5042 Chronic combined systolic (congestive) and diastolic (congestive) heart failure: Secondary | ICD-10-CM | POA: Diagnosis not present

## 2024-04-08 DIAGNOSIS — N1832 Chronic kidney disease, stage 3b: Secondary | ICD-10-CM | POA: Diagnosis not present

## 2024-04-08 DIAGNOSIS — I130 Hypertensive heart and chronic kidney disease with heart failure and stage 1 through stage 4 chronic kidney disease, or unspecified chronic kidney disease: Secondary | ICD-10-CM | POA: Diagnosis not present

## 2024-04-08 DIAGNOSIS — D631 Anemia in chronic kidney disease: Secondary | ICD-10-CM | POA: Diagnosis not present

## 2024-04-08 DIAGNOSIS — I5042 Chronic combined systolic (congestive) and diastolic (congestive) heart failure: Secondary | ICD-10-CM | POA: Diagnosis not present

## 2024-04-08 DIAGNOSIS — I480 Paroxysmal atrial fibrillation: Secondary | ICD-10-CM | POA: Diagnosis not present

## 2024-04-08 DIAGNOSIS — K5730 Diverticulosis of large intestine without perforation or abscess without bleeding: Secondary | ICD-10-CM | POA: Diagnosis not present

## 2024-04-11 ENCOUNTER — Emergency Department (HOSPITAL_COMMUNITY)
Admission: EM | Admit: 2024-04-11 | Discharge: 2024-04-12 | Disposition: A | Discharge status: Skilled Nursing Facility | Attending: Emergency Medicine | Admitting: Emergency Medicine

## 2024-04-11 ENCOUNTER — Emergency Department (HOSPITAL_COMMUNITY)

## 2024-04-11 ENCOUNTER — Encounter (HOSPITAL_COMMUNITY): Payer: Self-pay

## 2024-04-11 ENCOUNTER — Other Ambulatory Visit: Payer: Self-pay

## 2024-04-11 DIAGNOSIS — I5042 Chronic combined systolic (congestive) and diastolic (congestive) heart failure: Secondary | ICD-10-CM | POA: Insufficient documentation

## 2024-04-11 DIAGNOSIS — I110 Hypertensive heart disease with heart failure: Secondary | ICD-10-CM | POA: Insufficient documentation

## 2024-04-11 DIAGNOSIS — S82831A Other fracture of upper and lower end of right fibula, initial encounter for closed fracture: Secondary | ICD-10-CM | POA: Insufficient documentation

## 2024-04-11 DIAGNOSIS — R0602 Shortness of breath: Secondary | ICD-10-CM | POA: Insufficient documentation

## 2024-04-11 DIAGNOSIS — Z7901 Long term (current) use of anticoagulants: Secondary | ICD-10-CM | POA: Insufficient documentation

## 2024-04-11 DIAGNOSIS — Y92002 Bathroom of unspecified non-institutional (private) residence single-family (private) house as the place of occurrence of the external cause: Secondary | ICD-10-CM | POA: Diagnosis not present

## 2024-04-11 DIAGNOSIS — I771 Stricture of artery: Secondary | ICD-10-CM | POA: Diagnosis not present

## 2024-04-11 DIAGNOSIS — Z951 Presence of aortocoronary bypass graft: Secondary | ICD-10-CM | POA: Insufficient documentation

## 2024-04-11 DIAGNOSIS — R296 Repeated falls: Secondary | ICD-10-CM

## 2024-04-11 DIAGNOSIS — R531 Weakness: Secondary | ICD-10-CM | POA: Insufficient documentation

## 2024-04-11 DIAGNOSIS — M1711 Unilateral primary osteoarthritis, right knee: Secondary | ICD-10-CM | POA: Diagnosis not present

## 2024-04-11 DIAGNOSIS — Z79899 Other long term (current) drug therapy: Secondary | ICD-10-CM | POA: Insufficient documentation

## 2024-04-11 DIAGNOSIS — S8991XA Unspecified injury of right lower leg, initial encounter: Secondary | ICD-10-CM | POA: Diagnosis not present

## 2024-04-11 DIAGNOSIS — I2510 Atherosclerotic heart disease of native coronary artery without angina pectoris: Secondary | ICD-10-CM | POA: Insufficient documentation

## 2024-04-11 DIAGNOSIS — S0990XA Unspecified injury of head, initial encounter: Secondary | ICD-10-CM | POA: Diagnosis not present

## 2024-04-11 DIAGNOSIS — M19071 Primary osteoarthritis, right ankle and foot: Secondary | ICD-10-CM | POA: Diagnosis not present

## 2024-04-11 DIAGNOSIS — W19XXXA Unspecified fall, initial encounter: Secondary | ICD-10-CM | POA: Diagnosis not present

## 2024-04-11 DIAGNOSIS — M85871 Other specified disorders of bone density and structure, right ankle and foot: Secondary | ICD-10-CM | POA: Diagnosis not present

## 2024-04-11 DIAGNOSIS — I480 Paroxysmal atrial fibrillation: Secondary | ICD-10-CM | POA: Diagnosis not present

## 2024-04-11 DIAGNOSIS — M25571 Pain in right ankle and joints of right foot: Secondary | ICD-10-CM | POA: Diagnosis not present

## 2024-04-11 DIAGNOSIS — I700 Atherosclerosis of aorta: Secondary | ICD-10-CM | POA: Diagnosis not present

## 2024-04-11 DIAGNOSIS — R2681 Unsteadiness on feet: Secondary | ICD-10-CM | POA: Diagnosis not present

## 2024-04-11 DIAGNOSIS — S99911A Unspecified injury of right ankle, initial encounter: Secondary | ICD-10-CM | POA: Diagnosis not present

## 2024-04-11 LAB — COMPREHENSIVE METABOLIC PANEL WITH GFR
ALT: 10 U/L (ref 0–44)
AST: 24 U/L (ref 15–41)
Albumin: 4 g/dL (ref 3.5–5.0)
Alkaline Phosphatase: 65 U/L (ref 38–126)
Anion gap: 12 (ref 5–15)
BUN: 23 mg/dL (ref 8–23)
CO2: 23 mmol/L (ref 22–32)
Calcium: 9.3 mg/dL (ref 8.9–10.3)
Chloride: 105 mmol/L (ref 98–111)
Creatinine, Ser: 0.75 mg/dL (ref 0.61–1.24)
GFR, Estimated: >60 mL/min (ref >60)
Glucose, Bld: 100 mg/dL — ABNORMAL HIGH (ref 70–99)
Potassium: 3.9 mmol/L (ref 3.5–5.1)
Sodium: 139 mmol/L (ref 135–145)
Total Bilirubin: 0.6 mg/dL (ref 0.0–1.2)
Total Protein: 6.1 g/dL — ABNORMAL LOW (ref 6.5–8.1)

## 2024-04-11 LAB — CBC WITH DIFFERENTIAL/PLATELET
Abs Immature Granulocytes: 0.03 K/uL (ref 0.00–0.07)
Basophils Absolute: 0 K/uL (ref 0.0–0.1)
Basophils Relative: 0 %
Eosinophils Absolute: 0 K/uL (ref 0.0–0.5)
Eosinophils Relative: 0 %
HCT: 38.1 % — ABNORMAL LOW (ref 39.0–52.0)
Hemoglobin: 11.9 g/dL — ABNORMAL LOW (ref 13.0–17.0)
Immature Granulocytes: 0 %
Lymphocytes Relative: 9 %
Lymphs Abs: 0.8 K/uL (ref 0.7–4.0)
MCH: 29.5 pg (ref 26.0–34.0)
MCHC: 31.2 g/dL (ref 30.0–36.0)
MCV: 94.3 fL (ref 80.0–100.0)
Monocytes Absolute: 0.8 K/uL (ref 0.1–1.0)
Monocytes Relative: 9 %
Neutro Abs: 7.5 K/uL (ref 1.7–7.7)
Neutrophils Relative %: 82 %
Platelets: 274 K/uL (ref 150–400)
RBC: 4.04 MIL/uL — ABNORMAL LOW (ref 4.22–5.81)
RDW: 13.2 % (ref 11.5–15.5)
WBC: 9.3 K/uL (ref 4.0–10.5)
nRBC: 0 % (ref 0.0–0.2)

## 2024-04-11 LAB — URINALYSIS, ROUTINE W REFLEX MICROSCOPIC
Bilirubin Urine: NEGATIVE
Glucose, UA: NEGATIVE mg/dL
Hgb urine dipstick: NEGATIVE
Ketones, ur: 20 mg/dL — AB
Leukocytes,Ua: NEGATIVE
Nitrite: NEGATIVE
Protein, ur: NEGATIVE mg/dL
Specific Gravity, Urine: 1.012 (ref 1.005–1.030)
pH: 6 (ref 5.0–8.0)

## 2024-04-11 LAB — RESP PANEL BY RT-PCR (RSV, FLU A&B, COVID)  RVPGX2
Influenza A by PCR: NEGATIVE
Influenza B by PCR: NEGATIVE
Resp Syncytial Virus by PCR: NEGATIVE
SARS Coronavirus 2 by RT PCR: NEGATIVE

## 2024-04-11 LAB — PRO BRAIN NATRIURETIC PEPTIDE: Pro Brain Natriuretic Peptide: 7515 pg/mL — ABNORMAL HIGH (ref <300.0)

## 2024-04-11 LAB — CK: Total CK: 386 U/L (ref 49–397)

## 2024-04-11 MED ORDER — ACETAMINOPHEN 500 MG PO TABS: 1000.0000 mg | ORAL_TABLET | Freq: Three times a day (TID) | ORAL | Status: DC | PRN

## 2024-04-11 MED ORDER — MIDODRINE HCL 5 MG PO TABS
10.0000 mg | ORAL_TABLET | Freq: Three times a day (TID) | ORAL | Status: DC
  Administered 2024-04-11 – 2024-04-12 (×2): 10 mg via ORAL
  Filled 2024-04-11 (×2): qty 2

## 2024-04-11 MED ORDER — MIDODRINE HCL 5 MG PO TABS
10.0000 mg | ORAL_TABLET | Freq: Three times a day (TID) | ORAL | Status: DC
Start: 2024-04-11 — End: 2024-04-11

## 2024-04-11 MED ORDER — QUETIAPINE FUMARATE 25 MG PO TABS
12.5000 mg | ORAL_TABLET | Freq: Every day | ORAL | Status: DC
  Administered 2024-04-11: 12.5 mg via ORAL
  Filled 2024-04-11: qty 1

## 2024-04-11 MED ORDER — METOPROLOL TARTRATE 25 MG PO TABS
12.5000 mg | ORAL_TABLET | Freq: Every day | ORAL | Status: DC
  Administered 2024-04-11 – 2024-04-12 (×2): 12.5 mg via ORAL
  Filled 2024-04-11 (×2): qty 1

## 2024-04-11 MED ORDER — FLUDROCORTISONE ACETATE 0.1 MG PO TABS
0.1000 mg | ORAL_TABLET | Freq: Every day | ORAL | Status: DC
  Administered 2024-04-11 – 2024-04-12 (×2): 0.1 mg via ORAL
  Filled 2024-04-11 (×2): qty 1

## 2024-04-11 MED ORDER — APIXABAN 2.5 MG PO TABS
2.5000 mg | ORAL_TABLET | Freq: Two times a day (BID) | ORAL | Status: DC
  Administered 2024-04-11 – 2024-04-12 (×2): 2.5 mg via ORAL
  Filled 2024-04-11 (×2): qty 1

## 2024-04-11 MED ORDER — DUTASTERIDE 0.5 MG PO CAPS
0.5000 mg | ORAL_CAPSULE | Freq: Every day | ORAL | Status: DC
  Administered 2024-04-11 – 2024-04-12 (×2): 0.5 mg via ORAL
  Filled 2024-04-11 (×2): qty 1

## 2024-04-11 NOTE — ED Provider Notes (Signed)
 [place] EMERGENCY DEPARTMENT AT [facility] Provider Note   CSN: [id number] Arrival date & time: 04/11/24  1413     History  Chief Complaint  Patient presents with   Ankle Pain    Joshua Chambers is a 89 y.o. male with PMH as listed below who presents with PT arrives via EMS from his residence in [place]. PT twisted his RIGHT ankle and fell this morning. Pt has pain and swelling of the RIGHT ankle. Pt denies striking head or losing consciousness. Denies pain elsewhere besides the ankle.  Son at bedside states that he was found down in the bathroom with his head against the bathtub and son could not get him up and required EMS assistance. Son and daughter at bedside states that he lives with his wife independently. He has fallen 3 times in the last 48 hours due to increased generalized weakness. Last night he slipped out of bed and laid on the floor but didn't have the strength to get up and his button wasn't working, so he slept on the floor all night. Denies chest or abdominal pain, cough, flu-like symptoms, nausea/vomiting, urinary symptoms. Endorses some mild shortness of breath.    Past Medical History:  Diagnosis Date   Arteriosclerosis of arterial coronary artery bypass graft    Arthritis    Atrial contractions, premature    Atrial fibrillation (HCC)    Benign hypertension    Bilateral carotid artery stenosis    CAD (coronary artery disease), autologous vein bypass graft 04/19/2013   100% flush occlusion of SVG-OM & SVG-Diag; near Subtotal Occlusion of SVG-RCA (PCI of RCA done to avoid distal embolization with PCI to SVG)   CAD (coronary artery disease), native coronary artery 2007   Multivessel CAD referred for CABG   CAD S/P percutaneous coronary angioplasty 04/19/2013   PCI of Native RCA - Promus Premier DES 3.0 mm x 38 mm (3.6 mm);    Chronic anticoagulation 04/17/2018   Chronic combined systolic and diastolic heart failure (HCC)    Chronic left shoulder pain  11/09/2021   Coronary artery disease involving coronary bypass graft of native heart without angina pectoris; CAD (coronary artery disease) of artery bypass graft:  loss of VG to RCA and VG-Diag 04/17/2013   Dyslipidemia    Dyslipidemia, goal LDL below 70    Dyspnea 04/30/2013   Essential hypertension 05/02/2013   Fatigue 04/17/2013   GERD (gastroesophageal reflux disease)    Hematuria 05/02/2013   History of DVT of lower extremity    Right popliteal vein   History of stroke 02/2010   Possible TIA in 2001;  Echo: EF 45-50%, mild HK of anterior septum.   History of transient cerebral ischemia    Hx of orthostatic hypotension 2009   Positive tilt table:  dizziness/vasopressor type; previously on the decision    Hx SBO    Ischemic cardiomyopathy 02/06/2018   Left arm numbness 12/29/2020   Nephrolithiasis    Orthostatic hypotension    Osteoarthritis of left glenohumeral joint 11/09/2021   Other and unspecified coagulation defects 04/17/2013   PAF (paroxysmal atrial fibrillation) (HCC) 02/06/2018   Paroxysmal atrial fibrillation (HCC) 12/29/2020   Premature atrial contractions 07/28/2015   Preoperative examination 04/17/2013   IMO SNOMED Dx Update Oct 2024     S/P CABG x 3 2007   LIMA-LAD, SVG-DIAG, SVG-RCA   S/P CABG x 4; LIMA-LAD, SVG-RPDA, SVG-OM, SVG-D1 -- all but LIMA-LAD occluded 03/18/2006   03/2006 - LIMA-LAD, SVG-D1, SVG-RPDA  Sinus arrhythmia 09/09/2016   Stroke [facility])    TIA (transient ischemic attack) 03/16/2017   Unsteady gait        Home Medications Prior to Admission medications   Medication Sig Start Date End Date Taking? Authorizing Provider  Acetaminophen  (TYLENOL  EXTRA STRENGTH PO) Take 1,000 mg by mouth 2 (two) times daily as needed (PAIN).    [provider]  atorvastatin  (LIPITOR) 20 MG tablet Take 20 mg by mouth at bedtime. 05/12/21   [provider]  cyanocobalamin  (VITAMIN B12) 1000 MCG tablet Take 1,000 mcg by mouth daily.     [provider]  dutasteride  (AVODART ) 0.5 MG capsule Take 0.5 mg by mouth daily. 06/16/23   [provider]  ELIQUIS  2.5 MG TABS tablet TAKE 1 TABLET(2.5 MG) BY MOUTH TWICE DAILY 02/13/24   Monetta Redell PARAS, MD  esomeprazole (NEXIUM) 20 MG capsule Take 20 mg by mouth daily at 12 noon.    [provider]  ferrous sulfate 325 (65 FE) MG EC tablet Take 325 mg by mouth 3 (three) times daily with meals.    [provider]  fludrocortisone  (FLORINEF ) 0.1 MG tablet Take 1 tablet (0.1 mg total) by mouth daily. 08/17/23   Monetta Redell PARAS, MD  metoprolol  tartrate (LOPRESSOR ) 25 MG tablet Take 0.5 tablets (12.5 mg total) by mouth daily. 08/05/21   Monetta Redell PARAS, MD  midodrine  (PROAMATINE ) 10 MG tablet TAKE 1 TABLET(10 MG) BY MOUTH FOUR TIMES DAILY 06/01/20   Monetta Redell PARAS, MD  nitroGLYCERIN  (NITROSTAT ) 0.4 MG SL tablet Place 1 tablet (0.4 mg total) under the tongue every 5 (five) minutes as needed for chest pain. 03/06/19   Monetta Redell PARAS, MD  Polyvinyl Alcohol -Povidone (REFRESH OP) Place 1 drop into both eyes daily as needed (DRY EYES).    [provider]  pyridostigmine  (MESTINON ) 60 MG tablet 1/2 to 1 pill po tid 12/29/20   Sater, Charlie LABOR, MD  QUEtiapine  (SEROQUEL ) 25 MG tablet Take 12.5 mg by mouth at bedtime. 08/01/21   [provider]  Sennosides (SENOKOT PO) Take 5 mg by mouth 2 (two) times daily as needed (CONSTIPATION).    [provider]  tamsulosin (FLOMAX) 0.4 MG CAPS capsule Take 0.4 mg by mouth daily. 06/16/23   [provider]      Allergies    Amiodarone, Diltiazem, Statins, and Sulfa antibiotics    Review of Systems   Review of Systems A 10 point review of systems was performed and is negative unless otherwise reported in HPI.  Physical Exam Updated Vital Signs BP (!) 144/72 (BP Location: Right Arm)   Pulse 63   Temp 98 F (36.7 C) (Oral)   Resp 14   SpO2 97%  Physical Exam General: Normal appearing elderly  male, lying in bed.  HEENT: NCAT, PERRLA, Sclera anicteric, MMM, trachea midline. No midline C spine TTP deformities or stepoffs.  Cardiology: Irregular rate/rhythm, no murmurs/rubs/gallops. No chest wall TTP.  Resp: Normal respiratory rate and effort. CTAB, no wheezes, rhonchi, crackles.  Abd: Soft, non-tender, non-distended. No rebound tenderness or guarding.  Pelvis: pelvis stable/nontender MSK: TTP to medial and lateral malleoli of R ankle with mild swelling, no gross deformities or signs of injury. Intact distal pulses/perfusion/sensation. No TTP to R knee. Soft compartments. No TTP over 5th metatarsal or midfoot. Intact passive ROM of R ankle, decreased active ROM d/t pain.  Skin: warm, dry. Back: No midline T or L spine TTP Neuro: A&Ox1-2 (baseline per family), CNs II-XII grossly  intact. MAEs. Sensation grossly intact.   ED Results / Procedures / Treatments   Labs (all labs ordered are listed, but only abnormal results are displayed) Labs Reviewed  CBC WITH DIFFERENTIAL/PLATELET - Abnormal; Notable for the following components:      Result Value   RBC 4.04 (*)    Hemoglobin 11.9 (*)    HCT 38.1 (*)    All other components within normal limits  COMPREHENSIVE METABOLIC PANEL WITH GFR - Abnormal; Notable for the following components:   Glucose, Bld 100 (*)    Total Protein 6.1 (*)    All other components within normal limits  URINALYSIS, ROUTINE W REFLEX MICROSCOPIC - Abnormal; Notable for the following components:   Ketones, ur 20 (*)    All other components within normal limits  PRO BRAIN NATRIURETIC PEPTIDE - Abnormal; Notable for the following components:   Pro Brain Natriuretic Peptide 7,515.0 (*)    All other components within normal limits  RESP PANEL BY RT-PCR (RSV, FLU A&B, COVID)  RVPGX2  CK    EKG EKG Interpretation Date/Time:  Thursday April 11 2024 16:15:44 EDT Ventricular Rate:  63 PR Interval:    QRS Duration:  108 QT Interval:  482 QTC  Calculation: 494 R Axis:   -13  Text Interpretation: Atrial fibrillation LVH with secondary repolarization abnormality Inferior infarct, age indeterminate Confirmed by Franklyn Gills [phone]) on 04/11/2024 4:20:50 PM  Radiology CT Head Wo Contrast Result Date: 04/11/2024 CLINICAL DATA:  Fall 2 days ago.  Head trauma. EXAM: CT HEAD WITHOUT CONTRAST TECHNIQUE: Contiguous axial images were obtained from the base of the skull through the vertex without intravenous contrast. RADIATION DOSE REDUCTION: This exam was performed according to the departmental dose-optimization program which includes automated exposure control, adjustment of the mA and/or kV according to patient size and/or use of iterative reconstruction technique. COMPARISON:  06/08/2021 FINDINGS: Brain: There is atrophy and chronic small vessel disease changes. No acute intracranial abnormality. Specifically, no hemorrhage, hydrocephalus, mass lesion, acute infarction, or significant intracranial injury. Vascular: No hyperdense vessel or unexpected calcification. Skull: No acute calvarial abnormality. Sinuses/Orbits: No acute findings Other: None IMPRESSION: Atrophy, chronic microvascular disease. No acute intracranial abnormality. Electronically Signed   By: Franky Crease M.D.   On: 04/11/2024 19:17   DG Chest Portable 1 View Result Date: 04/11/2024 CLINICAL DATA:  Shortness of breath and weakness with a fall this morning. EXAM: PORTABLE CHEST 1 VIEW COMPARISON:  06/08/2021 FINDINGS: Patient rotation limits examination. Postoperative changes in the mediastinum. Heart size and pulmonary vascularity are normal for technique. Calcified and tortuous aorta. Lungs are clear. No pleural effusion pneumothorax. Degenerative changes in the spine and shoulders. IMPRESSION: No active disease. Electronically Signed   By: Elsie Gravely M.D.   On: 04/11/2024 17:30   DG Tibia/Fibula Right Result Date: 04/11/2024 CLINICAL DATA:  Right ankle pain after a fall.  Twisting injury to the right ankle this morning. EXAM: RIGHT TIBIA AND FIBULA - 2 VIEW COMPARISON:  Right ankle 04/11/2024 FINDINGS: Degenerative changes in the right knee. Degenerative changes in the right ankle. Soft tissue swelling about the right ankle, most prominent medially. No acute displaced fractures are identified. No focal bone lesion or bone destruction. IMPRESSION: Degenerative changes in the right knee and right ankle. Soft tissue swelling at the ankle. No acute fractures are identified. Electronically Signed   By: Elsie Gravely M.D.   On: 04/11/2024 16:05   DG Ankle Complete Right Result Date: 04/11/2024 CLINICAL DATA:  Fall with ankle pain  EXAM: RIGHT ANKLE - COMPLETE 3+ VIEW COMPARISON:  None Available. FINDINGS: The bones are diffusely osteopenic. On the lateral view there is a small cortical step-off along the posterior margin of the lateral malleolus. No acute fracture lines are identified on the AP or oblique views. A small nondisplaced fracture can not be excluded. No other fractures are seen. Joint spaces are well maintained. IMPRESSION: 1. Small cortical step-off along the posterior margin of the lateral malleolus on the lateral view. A small nondisplaced fracture can not be excluded. Correlate for point tenderness. 2. Osteopenia. Electronically Signed   By: Greig Pique M.D.   On: 04/11/2024 16:02    Procedures Procedures    Medications Ordered in ED Medications  acetaminophen  (TYLENOL ) tablet 1,000 mg (has no administration in time range)  dutasteride  (AVODART ) capsule 0.5 mg (has no administration in time range)  fludrocortisone  (FLORINEF ) tablet 0.1 mg (has no administration in time range)  metoprolol  tartrate (LOPRESSOR ) tablet 12.5 mg (has no administration in time range)  QUEtiapine  (SEROQUEL ) tablet 12.5 mg (has no administration in time range)  apixaban  (ELIQUIS ) tablet 2.5 mg (has no administration in time range)  midodrine  (PROAMATINE ) tablet 10 mg (has no  administration in time range)    ED Course/ Medical Decision Making/ A&P                          Medical Decision Making Amount and/or Complexity of Data Reviewed Labs: ordered. Radiology: ordered. Decision-making details documented in ED Course.  Risk OTC drugs. Prescription drug management.    This patient presents to the ED for concern of recurrent falls, generalized weakness, right ankle pain, this involves an extensive number of treatment options, and is a complaint that carries with it a high risk of complications and morbidity.  I considered the following differential and admission for this acute, potentially life threatening condition.   MDM:    Patient with several falls at home and new acute on chronic generalized weakness that has been worsening over the last several weeks but worse over the last day or 2.  He denies hitting his head but son states that he was found with his head against the bathtub so performed CT head which was reassuringly negative for any acute abnormality.  No C-spine tenderness or pain.  Does have pain around the ankle and reassuringly does not seem to have any other traumatic injuries.  He has tenderness to the lateral malleolus at the area on an x-ray where there seems to be a possible small nondisplaced fracture.  For this reason placed patient in a cam boot and he will need to follow with orthopedic surgery for this.  No concern for compartment syndrome, neurovascular injury, or proximal fibular injury. for generalized weakness he has no significant electrolyte derangements, anemia, renal injury, hypo-/hyperglycemia, UTI, pneumonia, or other evidence of acute infection.  Likely this is a chronic process and patient is very elderly.  Patient lives at home with his elderly wife and per his last clinic note presented in a wheelchair.  Son and daughter at bedside states that they are unable to care for him at home as well.  Clinical Course as of 04/11/24 2115   Thu Apr 11, 2024  1617 DG Ankle Complete Right 1. Small cortical step-off along the posterior margin of the lateral malleolus on the lateral view. A small nondisplaced fracture can not be excluded. Correlate for point tenderness. 2. Osteopenia.  [HN]  1757 DG Chest  Portable 1 View No active disease. [HN]  1932 CT Head Wo Contrast Atrophy, chronic microvascular disease.  No acute intracranial abnormality.   [HN]  2113 Discussed extensively with patient and family at bedside. Patient is A&Ox1-2, not oriented to place or time. Patient not safe to make his own decisions, and family at bedside agrees. Children at bedside state that he lives currently with their stepmother who is nonambulatory as well. He is essentially doing his own transfers but hasn't been able to since yesterday and it has been gradually getting worse for weeks to months. He now is in a boot, and they fear he will not be able to make transfers at all. He is a large man and they cannot lift him themselves, and neither can his wife. After SDM conversation, patient will board in the ED for PT/OT and TOC consult. Home meds ordered, diet ordered. [HN]    Clinical Course User Index [HN] Franklyn Sid SAILOR, MD    Labs: I Ordered, and personally interpreted labs.  The pertinent results include: Those listed above  Imaging Studies ordered: I ordered imaging studies including CT head, chest x-ray, right ankle x-ray I independently visualized and interpreted imaging. I agree with the radiologist interpretation  Additional history obtained from chart review, children at bedside.    Reevaluation: After the interventions noted above, I reevaluated the patient and found that they have :improved  Social Determinants of Health: Lives independently  Disposition:  PT/OT pending, TOC boarder  Co morbidities that complicate the patient evaluation  Past Medical History:  Diagnosis Date   Arteriosclerosis of arterial coronary artery  bypass graft    Arthritis    Atrial contractions, premature    Atrial fibrillation (HCC)    Benign hypertension    Bilateral carotid artery stenosis    CAD (coronary artery disease), autologous vein bypass graft 04/19/2013   100% flush occlusion of SVG-OM & SVG-Diag; near Subtotal Occlusion of SVG-RCA (PCI of RCA done to avoid distal embolization with PCI to SVG)   CAD (coronary artery disease), native coronary artery 2007   Multivessel CAD referred for CABG   CAD S/P percutaneous coronary angioplasty 04/19/2013   PCI of Native RCA - Promus Premier DES 3.0 mm x 38 mm (3.6 mm);    Chronic anticoagulation 04/17/2018   Chronic combined systolic and diastolic heart failure (HCC)    Chronic left shoulder pain 11/09/2021   Coronary artery disease involving coronary bypass graft of native heart without angina pectoris; CAD (coronary artery disease) of artery bypass graft:  loss of VG to RCA and VG-Diag 04/17/2013   Dyslipidemia    Dyslipidemia, goal LDL below 70    Dyspnea 04/30/2013   Essential hypertension 05/02/2013   Fatigue 04/17/2013   GERD (gastroesophageal reflux disease)    Hematuria 05/02/2013   History of DVT of lower extremity    Right popliteal vein   History of stroke 02/2010   Possible TIA in 2001;  Echo: EF 45-50%, mild HK of anterior septum.   History of transient cerebral ischemia    Hx of orthostatic hypotension 2009   Positive tilt table:  dizziness/vasopressor type; previously on the decision    Hx SBO    Ischemic cardiomyopathy 02/06/2018   Left arm numbness 12/29/2020   Nephrolithiasis    Orthostatic hypotension    Osteoarthritis of left glenohumeral joint 11/09/2021   Other and unspecified coagulation defects 04/17/2013   PAF (paroxysmal atrial fibrillation) (HCC) 02/06/2018   Paroxysmal atrial fibrillation (HCC) 12/29/2020  Premature atrial contractions 07/28/2015   Preoperative examination 04/17/2013   IMO SNOMED Dx Update Oct 2024     S/P CABG x 3 2007    LIMA-LAD, SVG-DIAG, SVG-RCA   S/P CABG x 4; LIMA-LAD, SVG-RPDA, SVG-OM, SVG-D1 -- all but LIMA-LAD occluded 03/18/2006   03/2006 - LIMA-LAD, SVG-D1, SVG-RPDA    Sinus arrhythmia 09/09/2016   Stroke [facility])    TIA (transient ischemic attack) 03/16/2017   Unsteady gait      Medicines Meds ordered this encounter  Medications   acetaminophen  (TYLENOL ) tablet 1,000 mg   DISCONTD: midodrine  (PROAMATINE ) tablet 10 mg   dutasteride  (AVODART ) capsule 0.5 mg   fludrocortisone  (FLORINEF ) tablet 0.1 mg   metoprolol  tartrate (LOPRESSOR ) tablet 12.5 mg   QUEtiapine  (SEROQUEL ) tablet 12.5 mg   apixaban  (ELIQUIS ) tablet 2.5 mg   midodrine  (PROAMATINE ) tablet 10 mg    I have reviewed the patients home medicines and have made adjustments as needed  Problem List / ED Course: Problem List Items Addressed This Visit   None Visit Diagnoses       Recurrent falls    -  Primary     Generalized weakness         Other closed fracture of distal end of right fibula, initial encounter                       This note was created using dictation software, which may contain spelling or grammatical errors.    Franklyn Sid SAILOR, MD 04/11/24 2121

## 2024-04-11 NOTE — Progress Notes (Signed)
 Orthopedic Tech Progress Note Patient Details:  Joshua Chambers February 14, 1923 [id number]  Ortho Devices Type of Ortho Device: CAM walker Ortho Device/Splint Location: right Ortho Device/Splint Interventions: Ordered, Application, Adjustment   Post Interventions Patient Tolerated: Well Instructions Provided: Adjustment of device, Care of device  Joshua Chambers 04/11/2024, 8:06 PM

## 2024-04-11 NOTE — ED Notes (Signed)
 Patient transported to CT

## 2024-04-11 NOTE — Progress Notes (Addendum)
 [facility] ED [facility] Liaison Note:  This is a current home health patient with [facility] receiving PT.   Liaisons will continue to follow for discharge disposition.   Please call with any questions or concerns.  Thank you, Eleanor Nail, LPN [facility] Liaison [phone]

## 2024-04-11 NOTE — ED Triage Notes (Signed)
 PT arrives via EMS from his residence in [place]. PT twisted his RIGHT ankle and fell this morning. Pt has pain and swelling of the RIGHT ankle. Pt denies striking head.

## 2024-04-12 DIAGNOSIS — Z7901 Long term (current) use of anticoagulants: Secondary | ICD-10-CM | POA: Diagnosis not present

## 2024-04-12 DIAGNOSIS — K219 Gastro-esophageal reflux disease without esophagitis: Secondary | ICD-10-CM | POA: Diagnosis not present

## 2024-04-12 DIAGNOSIS — M6281 Muscle weakness (generalized): Secondary | ICD-10-CM | POA: Diagnosis not present

## 2024-04-12 DIAGNOSIS — E440 Moderate protein-calorie malnutrition: Secondary | ICD-10-CM | POA: Diagnosis not present

## 2024-04-12 DIAGNOSIS — Z8673 Personal history of transient ischemic attack (TIA), and cerebral infarction without residual deficits: Secondary | ICD-10-CM | POA: Diagnosis not present

## 2024-04-12 DIAGNOSIS — M25512 Pain in left shoulder: Secondary | ICD-10-CM | POA: Diagnosis not present

## 2024-04-12 DIAGNOSIS — R41841 Cognitive communication deficit: Secondary | ICD-10-CM | POA: Diagnosis not present

## 2024-04-12 DIAGNOSIS — R262 Difficulty in walking, not elsewhere classified: Secondary | ICD-10-CM | POA: Diagnosis not present

## 2024-04-12 DIAGNOSIS — M8580 Other specified disorders of bone density and structure, unspecified site: Secondary | ICD-10-CM | POA: Diagnosis not present

## 2024-04-12 DIAGNOSIS — Z86718 Personal history of other venous thrombosis and embolism: Secondary | ICD-10-CM | POA: Diagnosis not present

## 2024-04-12 DIAGNOSIS — I5042 Chronic combined systolic (congestive) and diastolic (congestive) heart failure: Secondary | ICD-10-CM | POA: Diagnosis not present

## 2024-04-12 DIAGNOSIS — I480 Paroxysmal atrial fibrillation: Secondary | ICD-10-CM | POA: Diagnosis not present

## 2024-04-12 DIAGNOSIS — I4891 Unspecified atrial fibrillation: Secondary | ICD-10-CM | POA: Diagnosis not present

## 2024-04-12 DIAGNOSIS — F0390 Unspecified dementia without behavioral disturbance: Secondary | ICD-10-CM | POA: Diagnosis not present

## 2024-04-12 DIAGNOSIS — N183 Chronic kidney disease, stage 3 unspecified: Secondary | ICD-10-CM | POA: Diagnosis not present

## 2024-04-12 DIAGNOSIS — R2689 Other abnormalities of gait and mobility: Secondary | ICD-10-CM | POA: Diagnosis not present

## 2024-04-12 DIAGNOSIS — D649 Anemia, unspecified: Secondary | ICD-10-CM | POA: Diagnosis not present

## 2024-04-12 DIAGNOSIS — M19012 Primary osteoarthritis, left shoulder: Secondary | ICD-10-CM | POA: Diagnosis not present

## 2024-04-12 DIAGNOSIS — I25810 Atherosclerosis of coronary artery bypass graft(s) without angina pectoris: Secondary | ICD-10-CM | POA: Diagnosis not present

## 2024-04-12 DIAGNOSIS — E785 Hyperlipidemia, unspecified: Secondary | ICD-10-CM | POA: Diagnosis not present

## 2024-04-12 DIAGNOSIS — S82831D Other fracture of upper and lower end of right fibula, subsequent encounter for closed fracture with routine healing: Secondary | ICD-10-CM | POA: Diagnosis not present

## 2024-04-12 DIAGNOSIS — W19XXXD Unspecified fall, subsequent encounter: Secondary | ICD-10-CM | POA: Diagnosis not present

## 2024-04-12 DIAGNOSIS — R2681 Unsteadiness on feet: Secondary | ICD-10-CM | POA: Diagnosis not present

## 2024-04-12 DIAGNOSIS — Z7401 Bed confinement status: Secondary | ICD-10-CM | POA: Diagnosis not present

## 2024-04-12 DIAGNOSIS — R531 Weakness: Secondary | ICD-10-CM | POA: Diagnosis not present

## 2024-04-12 DIAGNOSIS — S82831A Other fracture of upper and lower end of right fibula, initial encounter for closed fracture: Secondary | ICD-10-CM | POA: Diagnosis not present

## 2024-04-12 DIAGNOSIS — I2510 Atherosclerotic heart disease of native coronary artery without angina pectoris: Secondary | ICD-10-CM | POA: Diagnosis not present

## 2024-04-12 DIAGNOSIS — M2550 Pain in unspecified joint: Secondary | ICD-10-CM | POA: Diagnosis not present

## 2024-04-12 DIAGNOSIS — Z951 Presence of aortocoronary bypass graft: Secondary | ICD-10-CM | POA: Diagnosis not present

## 2024-04-12 DIAGNOSIS — I10 Essential (primary) hypertension: Secondary | ICD-10-CM | POA: Diagnosis not present

## 2024-04-12 DIAGNOSIS — G459 Transient cerebral ischemic attack, unspecified: Secondary | ICD-10-CM | POA: Diagnosis not present

## 2024-04-12 DIAGNOSIS — R1312 Dysphagia, oropharyngeal phase: Secondary | ICD-10-CM | POA: Diagnosis not present

## 2024-04-12 DIAGNOSIS — I951 Orthostatic hypotension: Secondary | ICD-10-CM | POA: Diagnosis not present

## 2024-04-12 NOTE — [place] FL2 (Signed)
 [place]  MEDICAID FL2 LEVEL OF CARE FORM     IDENTIFICATION  Patient Name: Joshua Chambers Birthdate: 06-17-1923 Sex: male Admission Date (Current Location): 04/11/2024  [facility] and [place] Number:  Producer, television/film/video and Address:  [facility],  [address] [place], [place] [address]      Provider Number: [id number]  Attending Physician Name and Address:  Doretha Folks, MD  Relative Name and Phone Number:  Kwolek,Bruce (Son)  [phone] [facility])    Current Level of Care: Hospital Recommended Level of Care: Skilled Nursing Facility Prior Approval Number:    Date Approved/Denied:   PASRR Number: [id number]  Discharge Plan: SNF    Current Diagnoses: Patient Active Problem List   Diagnosis Date Noted   Osteoarthritis of left glenohumeral joint 11/09/2021   Chronic left shoulder pain 11/09/2021   Paroxysmal atrial fibrillation (HCC) 12/29/2020   Left arm numbness 12/29/2020   Unsteady gait    Stroke (HCC)    Nephrolithiasis    Hx SBO    History of transient cerebral ischemia    History of DVT of lower extremity    GERD (gastroesophageal reflux disease)    Dyslipidemia    Bilateral carotid artery stenosis    Benign hypertension    Atrial fibrillation (HCC)    Atrial contractions, premature    Arthritis    Arteriosclerosis of arterial coronary artery bypass graft    Chronic combined systolic and diastolic heart failure (HCC) 03/06/2019   Chronic anticoagulation 04/17/2018   Ischemic cardiomyopathy 02/06/2018   PAF (paroxysmal atrial fibrillation) (HCC) 02/06/2018   TIA (transient ischemic attack) 03/16/2017   Sinus arrhythmia 09/09/2016   Premature atrial contractions 07/28/2015   Hematuria 05/02/2013   Essential hypertension 05/02/2013   Dyspnea 04/30/2013   Orthostatic hypotension 04/20/2013   CAD (coronary artery disease), autologous vein bypass graft 04/19/2013   Coronary artery disease involving coronary bypass graft of native heart  without angina pectoris; CAD (coronary artery disease) of artery bypass graft:  loss of VG to RCA and VG-Diag 04/17/2013   CAD S/P percutaneous coronary angioplasty - PCI of native RCA; Promus DES 3.0 mm x38 mm (3.6 mm) 04/17/2013   Other and unspecified coagulation defects 04/17/2013   Preoperative examination 04/17/2013   Fatigue 04/17/2013   Dyslipidemia, goal LDL below 70    History of stroke 02/2010   Hx of orthostatic hypotension 2009   S/P CABG x 4; LIMA-LAD, SVG-RPDA, SVG-OM, SVG-D1 -- all but LIMA-LAD occluded 03/18/2006   S/P CABG x 3 2007   CAD (coronary artery disease), native coronary artery 2007    Orientation RESPIRATION BLADDER Height & Weight     Self, Time, Situation, Place  Normal Continent Weight:   Height:     BEHAVIORAL SYMPTOMS/MOOD NEUROLOGICAL BOWEL NUTRITION STATUS      Continent Diet (Regular)  AMBULATORY STATUS COMMUNICATION OF NEEDS Skin     Verbally Normal                       Personal Care Assistance Level of Assistance              Functional Limitations Info  Sight, Hearing, Speech Sight Info: Adequate Hearing Info: Adequate Speech Info: Adequate    SPECIAL CARE FACTORS FREQUENCY  PT (By licensed PT), OT (By licensed OT)     PT Frequency: x5/week OT Frequency: x5/week            Contractures Contractures Info: Not present    Additional Factors  Info  Code Status, Allergies Code Status Info: Full Allergies Info: Amiodarone  Diltiazem  Statins  Sulfa Antibiotics           Current Medications (04/12/2024):  This is the current hospital active medication list Current Facility-Administered Medications  Medication Dose Route Frequency Provider Last Rate Last Admin   acetaminophen  (TYLENOL ) tablet 1,000 mg  1,000 mg Oral Q8H PRN Franklyn Sid SAILOR, MD       apixaban  (ELIQUIS ) tablet 2.5 mg  2.5 mg Oral BID Franklyn Sid SAILOR, MD   2.5 mg at 04/11/24 2149   dutasteride  (AVODART ) capsule 0.5 mg  0.5 mg Oral Daily Naasz, Hayley N,  MD   0.5 mg at 04/11/24 2151   fludrocortisone  (FLORINEF ) tablet 0.1 mg  0.1 mg Oral Daily Naasz, Hayley N, MD   0.1 mg at 04/11/24 2147   metoprolol  tartrate (LOPRESSOR ) tablet 12.5 mg  12.5 mg Oral Daily Naasz, Hayley N, MD   12.5 mg at 04/11/24 2148   midodrine  (PROAMATINE ) tablet 10 mg  10 mg Oral TID Naasz, Hayley N, MD   10 mg at 04/11/24 2149   QUEtiapine  (SEROQUEL ) tablet 12.5 mg  12.5 mg Oral QHS Franklyn Sid SAILOR, MD   12.5 mg at 04/11/24 2148   Current Outpatient Medications  Medication Sig Dispense Refill   Acetaminophen  (TYLENOL  EXTRA STRENGTH PO) Take 1,000 mg by mouth See admin instructions. Take 1,000 mg by mouth in the morning & at bedtime and an additional 1,000 mg midday as needed for pain     atorvastatin  (LIPITOR) 20 MG tablet Take 20 mg by mouth at bedtime.     Cyanocobalamin  (VITAMIN B 12) 500 MCG TABS Take 1,000 mcg by mouth daily at 12 noon.     docusate sodium (COLACE) 100 MG capsule Take 100 mg by mouth 2 (two) times daily as needed for mild constipation or moderate constipation.     dutasteride  (AVODART ) 0.5 MG capsule Take 0.5 mg by mouth daily.     ELIQUIS  2.5 MG TABS tablet TAKE 1 TABLET(2.5 MG) BY MOUTH TWICE DAILY (Patient taking differently: Take 2.5 mg by mouth in the morning and at bedtime.) 180 tablet 1   esomeprazole (NEXIUM) 20 MG capsule Take 20 mg by mouth daily before breakfast.     fludrocortisone  (FLORINEF ) 0.1 MG tablet Take 1 tablet (0.1 mg total) by mouth daily. 90 tablet 3   metoprolol  tartrate (LOPRESSOR ) 25 MG tablet Take 0.5 tablets (12.5 mg total) by mouth daily. (Patient taking differently: Take 12.5 mg by mouth in the morning and at bedtime.) 90 tablet 1   midodrine  (PROAMATINE ) 10 MG tablet TAKE 1 TABLET(10 MG) BY MOUTH FOUR TIMES DAILY (Patient taking differently: Take 10 mg by mouth See admin instructions. Take 10 mg by mouth at 9 AM, 1 PM, 5 PM, and bedtime) 360 tablet 0   mirtazapine (REMERON) 30 MG tablet Take 30 mg by mouth at bedtime.      nitroGLYCERIN  (NITROSTAT ) 0.4 MG SL tablet Place 1 tablet (0.4 mg total) under the tongue every 5 (five) minutes as needed for chest pain. 25 tablet 6   PULMICORT  FLEXHALER 180 MCG/ACT inhaler Inhale 1 puff into the lungs in the morning and at bedtime.     QUEtiapine  (SEROQUEL ) 25 MG tablet Take 12.5 mg by mouth at bedtime.     REFRESH TEARS 0.5 % SOLN Place 1 drop into both eyes in the morning, at noon, and at bedtime.     senna (SENOKOT) 8.6 MG TABS  tablet Take 1 tablet by mouth daily as needed for mild constipation.     tamsulosin (FLOMAX) 0.4 MG CAPS capsule Take 0.4 mg by mouth at bedtime.     ferrous sulfate 325 (65 FE) MG EC tablet Take 325 mg by mouth 3 (three) times daily with meals.     pyridostigmine  (MESTINON ) 60 MG tablet 1/2 to 1 pill po tid (Patient not taking: Reported on 04/11/2024) 90 tablet 5     Discharge Medications: Please see discharge summary for a list of discharge medications.  Relevant Imaging Results:  Relevant Lab Results:   Additional Information SSN:[id number]  Kari JONETTA Daisy, LCSW

## 2024-04-12 NOTE — Progress Notes (Signed)
 Awaiting PT eval.

## 2024-04-12 NOTE — Discharge Instructions (Signed)
 You need to wear the walking boot when you are up moving around until you are cleared by orthopedics in approximately 2 weeks.  Elevate the foot to help with swelling.  You can take Tylenol  as needed for the pain.  Continue all your other home medications.

## 2024-04-12 NOTE — Evaluation (Signed)
 Occupational Therapy Evaluation Patient Details Name: Joshua Chambers MRN: [id number] DOB: 26-Mar-1923 Today's Date: 04/12/2024   History of Present Illness   89yo male brought to [facility] via EMS after 2 recent falls,  right ankle injury, CT ankle- A small nondisplaced fracture can not  be excluded. PMH: CABG, CAD, orthostatic, TIA, CVA,afib, Ishemic cardiomyopaty, HTN.     Clinical Impressions PTA, patient lives at home with wife who patient reports uses a scooter for mobility and he was using a RW. Limited recall and historian due to [facility] deficits however reports and chart review reveals son lives in [place] and patient states he and his wife have some PCA services 2-3 x per week. Currently, patient presents with deficits outlined below (see OT Problem List for details) most significantly \\pain , muscle weakness, decreased cognition, balance and activity tolerance and HOH impacting BADL (LB max-total A) and functional mobility (STS +2 max A). Patient requires continued Acute care hospital level OT services to progress safety and functional performance and allow for discharge. Patient will benefit from continued inpatient follow up therapy, <3 hours/day.        If plan is discharge home, recommend the following:   Two people to help with walking and/or transfers;Two people to help with bathing/dressing/bathroom;Assistance with cooking/housework;Direct supervision/assist for medications management;Direct supervision/assist for financial management;Assist for transportation;Help with stairs or ramp for entrance;Supervision due to cognitive status     Functional Status Assessment   Patient has had a recent decline in their functional status and demonstrates the ability to make significant improvements in function in a reasonable and predictable amount of time.     Equipment Recommendations   BSC/3in1;Other (comment);Wheelchair (measurements OT);Wheelchair cushion (measurements OT) (RW if  home)      Precautions/Restrictions   Precautions Precautions: Fall Required Braces or Orthoses: Other Brace Other Brace: CAM boot right Restrictions Other Position/Activity Restrictions: No WBS ordered     Mobility Bed Mobility Overal bed mobility: Needs Assistance Bed Mobility: Rolling, Supine to Sit, Sit to Supine Rolling: Mod assist   Supine to sit: Mod assist Sit to supine: Mod assist   General bed mobility comments: assist with legs and trunk, patient able to scoot self to [facility] along edge, using BUE's    Transfers Overall transfer level: Needs assistance Equipment used: Rolling walker (2 wheels) Transfers: Sit to/from Stand Sit to Stand: Max assist, +2 physical assistance, +2 safety/equipment, From elevated surface           General transfer comment: Max support to power up to stand , trunk flexed, unable to side step.      Balance Overall balance assessment: Needs assistance, History of Falls Sitting-balance support: No upper extremity supported, Feet supported Sitting balance-Leahy Scale: Fair     Standing balance support: Bilateral upper extremity supported, Reliant on assistive device for balance, During functional activity Standing balance-Leahy Scale: Poor                             ADL either performed or assessed with clinical judgement   ADL Overall ADL's : Needs assistance/impaired Eating/Feeding: Set up;Bed level   Grooming: Wash/dry hands;Wash/dry face;Sitting;Contact guard assist   Upper Body Bathing: Minimal assistance;Sitting   Lower Body Bathing: Maximal assistance;Bed level   Upper Body Dressing : Minimal assistance;Sitting   Lower Body Dressing: Total assistance;Bed level   Toilet Transfer: Maximal assistance;+2 for physical assistance;+2 for safety/equipment (STS +2)   Toileting- Clothing Manipulation and Hygiene: Maximal assistance  Toileting - Clothing Manipulation Details (indicate cue type and reason): brief  in place     Functional mobility during ADLs: +2 for physical assistance;+2 for safety/equipment;Maximal assistance General ADL Comments: unable to manage R CAM nor LB self care     Vision Ability to See in Adequate Light: 0 Adequate Patient Visual Report: No change from baseline Vision Assessment?: No apparent visual deficits            Pertinent Vitals/Pain Pain Assessment Pain Assessment: Faces Faces Pain Scale: Hurts little more Pain Location: left hip, right foot Pain Descriptors / Indicators: Discomfort Pain Intervention(s): Monitored during session, Limited activity within patient's tolerance, Repositioned     Extremity/Trunk Assessment Upper Extremity Assessment Upper Extremity Assessment: Right hand dominant;Generalized weakness   Lower Extremity Assessment Lower Extremity Assessment: Generalized weakness;RLE deficits/detail RLE Deficits / Details: CaM boot   Cervical / Trunk Assessment Cervical / Trunk Assessment: Kyphotic   Communication Communication Communication: Impaired Factors Affecting Communication: Hearing impaired   Cognition Arousal: Alert Behavior During Therapy: WFL for tasks assessed/performed                                 Following commands: Intact       Cueing  General Comments   Cueing Techniques: Verbal cues;Tactile cues;Gestural cues  no evident bruising, R ankle edema as per chart review ut CAM boot in place           Home Living Family/patient expects to be discharged to:: Private residence Living Arrangements: Spouse/significant other Available Help at Discharge: Family;Available 24 hours/day Type of Home: House                           Additional Comments: No family at bedside with patient a limited historian re hoe set up      Prior Functioning/Environment Prior Level of Function : Independent/Modified Independent             Mobility Comments: does not drive, uses a RW ADLs  Comments: reports independnet    OT Problem List: Decreased strength;Decreased activity tolerance;Impaired balance (sitting and/or standing);Decreased cognition;Decreased safety awareness;Decreased knowledge of use of DME or AE;Decreased knowledge of precautions;Pain;Increased edema   OT Treatment/Interventions: Self-care/ADL training;Therapeutic exercise;Neuromuscular education;Energy conservation;DME and/or AE instruction;Therapeutic activities;Cognitive remediation/compensation;Patient/family education;Balance training      OT Goals(Current goals can be found in the care plan section)   Acute Rehab OT Goals Patient Stated Goal: to feel better OT Goal Formulation: With patient Time For Goal Achievement: 04/26/24 Potential to Achieve Goals: Fair ADL Goals Pt Will Perform Lower Body Bathing: with mod assist;sit to/from stand Pt Will Perform Lower Body Dressing: with mod assist;sit to/from stand Pt Will Transfer to Toilet: with mod assist;bedside commode Pt Will Perform Toileting - Clothing Manipulation and hygiene: with mod assist;sit to/from stand   OT Frequency:  Min 2X/week    Co-evaluation PT/OT/SLP Co-Evaluation/Treatment: Yes Reason for Co-Treatment: For patient/therapist safety PT goals addressed during session: Mobility/safety with mobility OT goals addressed during session: ADL's and self-care      AM-PAC OT 6 Clicks Daily Activity     Outcome Measure Help from another person eating meals?: A Little Help from another person taking care of personal grooming?: A Little Help from another person toileting, which includes using toliet, bedpan, or urinal?: A Lot Help from another person bathing (including washing, rinsing, drying)?: A Lot Help from another person to  put on and taking off regular upper body clothing?: A Little Help from another person to put on and taking off regular lower body clothing?: Total 6 Click Score: 14   End of Session Equipment Utilized During  Treatment: Gait belt;Rolling walker (2 wheels) Nurse Communication: Mobility status  Activity Tolerance: Patient limited by pain;Patient limited by fatigue Patient left: in bed;with call bell/phone within reach  OT Visit Diagnosis: Unsteadiness on feet (R26.81);Other abnormalities of gait and mobility (R26.89);Muscle weakness (generalized) (M62.81);History of falling (Z91.81);Pain Pain - Right/Left: Right Pain - part of body: Ankle and joints of foot;Hip                Time: 9162-9140 OT Time Calculation (min): 22 min Charges:  OT General Charges $OT Visit: 1 Visit OT Evaluation $OT Eval Low Complexity: 1 Low  Keziyah Kneale OT/L Acute Rehabilitation Department  [phone]  04/12/2024, 9:57 AM

## 2024-04-12 NOTE — ED Provider Notes (Signed)
 Patient here for placement due to poor mobility at home now with a fall and ankle sprain versus nondisplaced fracture.  [facility] is otherwise stable   Doretha Folks, MD 04/12/24 1135

## 2024-04-12 NOTE — Progress Notes (Signed)
 CSW met with pt and family at bedside. They are agreeable to SNF and would like [facility] [place]. Referral sent for review.

## 2024-04-12 NOTE — ED Notes (Signed)
 Pt cleaned up, new brief and bed sheets placed.

## 2024-04-12 NOTE — Evaluation (Signed)
 Physical Therapy Evaluation Patient Details Name: Joshua Chambers MRN: [id number] DOB: 1923/02/16 Today's Date: 04/12/2024  History of Present Illness  89yo male brought to [facility] via EMS after 2 recent falls,  right ankle injury, CT ankle- A small nondisplaced fracture can not  be excluded. PMH: CABG, CAD, orthostatic, TIA, CVA,afib, Ishemic cardiomyopaty, HTN.  Clinical Impression  Pt admitted with above diagnosis.  Pt currently with functional limitations due to the deficits listed below (see PT Problem List). Pt will benefit from acute skilled PT to increase their independence and safety with mobility to allow discharge.       Patient is very pleasant and able to participate in mobility. Patient reports Left hip discomfort and right  ankle with mobility. Patient required max  of 2 to stand at [facility]  but unable to take a step.   Patient reports ambulatory with RW at baseline, resides with wife who uses a scooter, [place] drive[place]  Patient will benefit from continued inpatient follow up therapy, <3 hours/day.  Patient did report mild dizziness upon sitting which did not worsen. BP was stable.     If plan is discharge home, recommend the following: Two people to help with walking and/or transfers;A lot of help with bathing/dressing/bathroom;Assist for transportation;Help with stairs or ramp for entrance   Can travel by private vehicle        Equipment Recommendations None recommended by PT  Recommendations for Other Services       Functional Status Assessment Patient has had a recent decline in their functional status and demonstrates the ability to make significant improvements in function in a reasonable and predictable amount of time.     Precautions / Restrictions Precautions Precautions: Fall Required Braces or Orthoses: Other Brace Other Brace: CAM boot right Restrictions Other Position/Activity Restrictions: No WBS ordered      Mobility  Bed  Mobility Overal bed mobility: Needs Assistance Bed Mobility: Rolling, Supine to Sit, Sit to Supine Rolling: Mod assist   Supine to sit: Mod assist Sit to supine: Mod assist   General bed mobility comments: assist with legs and trunk, patient able to scoot self to [facility] along edge, using BUE's    Transfers Overall transfer level: Needs assistance Equipment used: Rolling walker (2 wheels) Transfers: Sit to/from Stand Sit to Stand: Max assist, +2 physical assistance, +2 safety/equipment, From elevated surface           General transfer comment: Max support to power up to stand , trunk flexed, unable to side step.    Ambulation/Gait                  Stairs            Wheelchair Mobility     Tilt Bed    Modified Rankin (Stroke Patients Only)       Balance Overall balance assessment: Needs assistance, History of Falls Sitting-balance support: No upper extremity supported, Feet supported Sitting balance-Leahy Scale: Fair     Standing balance support: Bilateral upper extremity supported, Reliant on assistive device for balance, During functional activity Standing balance-Leahy Scale: Poor                               Pertinent Vitals/Pain Pain Assessment Pain Assessment: Faces Faces Pain Scale: Hurts little more Pain Location: left hip, right foot Pain Descriptors / Indicators: Discomfort Pain Intervention(s): Monitored during session    Home Living  Family/patient expects to be discharged to:: Private residence Living Arrangements: Spouse/significant other                      Prior Function Prior Level of Function : Independent/Modified Independent             Mobility Comments: does not drive, uses a RW ADLs Comments: reports independnet     Extremity/Trunk Assessment        Lower Extremity Assessment Lower Extremity Assessment: Generalized weakness;RLE deficits/detail RLE Deficits / Details: CaM boot     Cervical / Trunk Assessment Cervical / Trunk Assessment: Kyphotic  Communication   Communication Communication: Impaired Factors Affecting Communication: Hearing impaired    Cognition Arousal: Alert Behavior During Therapy: WFL for tasks assessed/performed   PT - Cognitive impairments: Orientation   Orientation impairments: Time                   PT - Cognition Comments: oriwented to hospital, not city, not date, Following commands: Intact       Cueing Cueing Techniques: Verbal cues, Tactile cues, Gestural cues     General Comments      Exercises     Assessment/Plan    PT Assessment Patient needs continued PT services  PT Problem List Decreased strength;Decreased knowledge of use of DME;Decreased safety awareness;Decreased range of motion;Decreased activity tolerance;Decreased knowledge of precautions;Decreased balance;Decreased mobility;Decreased cognition;Pain       PT Treatment Interventions DME instruction;Patient/family education;Gait training;Functional mobility training;Therapeutic activities;Therapeutic exercise;Balance training;Cognitive remediation    PT Goals (Current goals can be found in the Care Plan section)  Acute Rehab PT Goals Patient Stated Goal: agreeable to mobility PT Goal Formulation: With patient Time For Goal Achievement: 04/26/24 Potential to Achieve Goals: Fair    Frequency Min 2X/week     Co-evaluation PT/OT/SLP Co-Evaluation/Treatment: Yes Reason for Co-Treatment: For patient/therapist safety PT goals addressed during session: Mobility/safety with mobility OT goals addressed during session: ADL's and self-care       AM-PAC PT 6 Clicks Mobility  Outcome Measure Help needed turning from your back to your side while in a flat bed without using bedrails?: A Lot Help needed moving from lying on your back to sitting on the side of a flat bed without using bedrails?: A Lot Help needed moving to and from a bed to a chair  (including a wheelchair)?: A Lot Help needed standing up from a chair using your arms (e.g., wheelchair or bedside chair)?: A Lot Help needed to walk in hospital room?: Total Help needed climbing 3-5 steps with a railing? : Total 6 Click Score: 10    End of Session   Activity Tolerance: Patient tolerated treatment well Patient left: in bed;with call bell/phone within reach Nurse Communication: Mobility status PT Visit Diagnosis: Unsteadiness on feet (R26.81)    Time: 9164-9099 PT Time Calculation (min) (ACUTE ONLY): 25 min   Charges:   PT Evaluation $PT Eval Low Complexity: 1 Low   PT General Charges $$ ACUTE PT VISIT: 1 Visit         Darice Potters PT Acute Rehabilitation Services Office [phone]   Potters Darice Norris 04/12/2024, 9:21 AM

## 2024-04-12 NOTE — ED Notes (Signed)
 [facility] called for transport

## 2024-04-13 DIAGNOSIS — F0390 Unspecified dementia without behavioral disturbance: Secondary | ICD-10-CM | POA: Diagnosis not present

## 2024-04-13 DIAGNOSIS — I2510 Atherosclerotic heart disease of native coronary artery without angina pectoris: Secondary | ICD-10-CM | POA: Diagnosis not present

## 2024-04-13 DIAGNOSIS — R262 Difficulty in walking, not elsewhere classified: Secondary | ICD-10-CM | POA: Diagnosis not present

## 2024-04-13 DIAGNOSIS — I4891 Unspecified atrial fibrillation: Secondary | ICD-10-CM | POA: Diagnosis not present

## 2024-04-17 DIAGNOSIS — F02B0 Dementia in other diseases classified elsewhere, moderate, without behavioral disturbance, psychotic disturbance, mood disturbance, and anxiety: Secondary | ICD-10-CM | POA: Diagnosis not present

## 2024-04-24 DIAGNOSIS — S82401A Unspecified fracture of shaft of right fibula, initial encounter for closed fracture: Secondary | ICD-10-CM | POA: Diagnosis not present

## 2024-04-25 DIAGNOSIS — F4325 Adjustment disorder with mixed disturbance of emotions and conduct: Secondary | ICD-10-CM | POA: Diagnosis not present

## 2024-05-15 DIAGNOSIS — M6281 Muscle weakness (generalized): Secondary | ICD-10-CM | POA: Diagnosis not present

## 2024-05-15 DIAGNOSIS — S82831D Other fracture of upper and lower end of right fibula, subsequent encounter for closed fracture with routine healing: Secondary | ICD-10-CM | POA: Diagnosis not present

## 2024-05-15 DIAGNOSIS — Z7409 Other reduced mobility: Secondary | ICD-10-CM | POA: Diagnosis not present

## 2024-05-15 DIAGNOSIS — R609 Edema, unspecified: Secondary | ICD-10-CM | POA: Diagnosis not present

## 2024-05-15 DIAGNOSIS — D6489 Other specified anemias: Secondary | ICD-10-CM | POA: Diagnosis not present

## 2024-05-16 ENCOUNTER — Other Ambulatory Visit: Payer: Self-pay | Admitting: Cardiology

## 2024-05-18 DIAGNOSIS — F02B0 Dementia in other diseases classified elsewhere, moderate, without behavioral disturbance, psychotic disturbance, mood disturbance, and anxiety: Secondary | ICD-10-CM | POA: Diagnosis not present

## 2024-05-29 DIAGNOSIS — S82831D Other fracture of upper and lower end of right fibula, subsequent encounter for closed fracture with routine healing: Secondary | ICD-10-CM | POA: Diagnosis not present

## 2024-05-29 DIAGNOSIS — I130 Hypertensive heart and chronic kidney disease with heart failure and stage 1 through stage 4 chronic kidney disease, or unspecified chronic kidney disease: Secondary | ICD-10-CM | POA: Diagnosis not present

## 2024-05-29 DIAGNOSIS — I5042 Chronic combined systolic (congestive) and diastolic (congestive) heart failure: Secondary | ICD-10-CM | POA: Diagnosis not present

## 2024-06-05 DIAGNOSIS — M25571 Pain in right ankle and joints of right foot: Secondary | ICD-10-CM | POA: Diagnosis not present

## 2024-06-05 DIAGNOSIS — S89301D Unspecified physeal fracture of lower end of right fibula, subsequent encounter for fracture with routine healing: Secondary | ICD-10-CM | POA: Diagnosis not present

## 2024-06-05 DIAGNOSIS — S93401D Sprain of unspecified ligament of right ankle, subsequent encounter: Secondary | ICD-10-CM | POA: Diagnosis not present

## 2024-06-17 DIAGNOSIS — F02B0 Dementia in other diseases classified elsewhere, moderate, without behavioral disturbance, psychotic disturbance, mood disturbance, and anxiety: Secondary | ICD-10-CM | POA: Diagnosis not present

## 2024-08-08 ENCOUNTER — Other Ambulatory Visit: Payer: Self-pay | Admitting: Cardiology
